# Patient Record
Sex: Female | Born: 1960
Health system: Southern US, Community
[De-identification: ages and names within clinical notes are randomized; demographics above are authoritative.]

## PROBLEM LIST (undated history)

## (undated) DIAGNOSIS — I10 Essential (primary) hypertension: Secondary | ICD-10-CM

## (undated) DIAGNOSIS — K635 Polyp of colon: Secondary | ICD-10-CM

## (undated) DIAGNOSIS — D649 Anemia, unspecified: Secondary | ICD-10-CM

## (undated) DIAGNOSIS — F32A Depression, unspecified: Secondary | ICD-10-CM

## (undated) DIAGNOSIS — F329 Major depressive disorder, single episode, unspecified: Secondary | ICD-10-CM

## (undated) DIAGNOSIS — K5790 Diverticulosis of intestine, part unspecified, without perforation or abscess without bleeding: Secondary | ICD-10-CM

## (undated) DIAGNOSIS — E781 Pure hyperglyceridemia: Secondary | ICD-10-CM

## (undated) DIAGNOSIS — K828 Other specified diseases of gallbladder: Secondary | ICD-10-CM

## (undated) HISTORY — PX: DILATION AND CURETTAGE OF UTERUS: SHX78

## (undated) HISTORY — DX: Depression, unspecified: F32.A

## (undated) HISTORY — PX: BREAST BIOPSY: SHX20

## (undated) HISTORY — DX: Anemia, unspecified: D64.9

## (undated) HISTORY — DX: Other specified diseases of gallbladder: K82.8

## (undated) HISTORY — DX: Polyp of colon: K63.5

## (undated) HISTORY — PX: TUBAL LIGATION: SHX77

## (undated) HISTORY — DX: Major depressive disorder, single episode, unspecified: F32.9

## (undated) HISTORY — DX: Diverticulosis of intestine, part unspecified, without perforation or abscess without bleeding: K57.90

---

## 2010-03-30 HISTORY — PX: BREAST EXCISIONAL BIOPSY: SUR124

## 2013-05-05 LAB — LIPID PANEL
CHOLESTEROL: 280 mg/dL — AB (ref 0–200)
HDL: 52 mg/dL (ref 35–70)
LDL CALC: 168 mg/dL
TRIGLYCERIDES: 301 mg/dL — AB (ref 40–160)

## 2013-05-05 LAB — CMP14+LP+1AC+CBC/D/PLT+TSH
Calcium: 9.8 mg/dL
Chloride: 98 mmol/L
TSH: 0.98

## 2013-05-05 LAB — BASIC METABOLIC PANEL
CREATININE: 0.8 mg/dL (ref 0.5–1.1)
Glucose: 104 mg/dL
POTASSIUM: 3.8 mmol/L (ref 3.4–5.3)
SODIUM: 136 mmol/L — AB (ref 137–147)

## 2013-05-05 LAB — CBC AND DIFFERENTIAL
HEMOGLOBIN: 15.5 g/dL (ref 12.0–16.0)
Platelets: 261 10*3/uL (ref 150–399)
WBC: 8.5 10*3/mL

## 2013-05-05 LAB — HEPATIC FUNCTION PANEL
ALT: 29 U/L (ref 7–35)
AST: 35 U/L (ref 13–35)

## 2014-02-02 ENCOUNTER — Emergency Department
Admission: EM | Admit: 2014-02-02 | Discharge: 2014-02-02 | Disposition: A | Discharge status: Home / Self Care | Payer: 59 | Source: Home / Self Care | Attending: Family Medicine | Admitting: Family Medicine

## 2014-02-02 ENCOUNTER — Encounter: Payer: Self-pay | Admitting: Emergency Medicine

## 2014-02-02 ENCOUNTER — Telehealth: Payer: Self-pay | Admitting: Emergency Medicine

## 2014-02-02 DIAGNOSIS — H9201 Otalgia, right ear: Secondary | ICD-10-CM

## 2014-02-02 HISTORY — DX: Essential (primary) hypertension: I10

## 2014-02-02 LAB — POCT CBC W AUTO DIFF (K'VILLE URGENT CARE)

## 2014-02-02 LAB — SEDIMENTATION RATE: SED RATE: 7 mm/h (ref 0–22)

## 2014-02-02 LAB — C-REACTIVE PROTEIN: CRP: <0.5 mg/dL (ref <0.60)

## 2014-02-02 MED ORDER — ANTIPYRINE-BENZOCAINE 5.4-1.4 % OT SOLN: 3.0000 [drp] | OTIC | Status: DC | PRN

## 2014-02-02 NOTE — ED Provider Notes (Signed)
Nicole Osborne is a 53 y.o. female who presents to Urgent Care today for Right ear pain. Patient has a two-month history of right ear pain worsening over the past 3 days. Pain radiates to the temple. No fevers or chills nausea vomiting or diarrhea. Patient notes tearfulness but denies any change in hearing. She has tried Tylenol and ibuprofen which helped a bit.   Past Medical History  Diagnosis Date  . Hypertension    History reviewed. No pertinent past surgical history. History  Substance Use Topics  . Smoking status: Never Smoker   . Smokeless tobacco: Not on file  . Alcohol Use: Yes   ROS as above Medications: No current facility-administered medications for this encounter.   Current Outpatient Prescriptions  Medication Sig Dispense Refill  . lisinopril (PRINIVIL,ZESTRIL) 20 MG tablet Take 20 mg by mouth daily.    Marland Kitchen antipyrine-benzocaine (AURALGAN) otic solution Place 3-4 drops into the right ear every 2 (two) hours as needed for ear pain. 10 mL 0   No Known Allergies   Exam:  BP 122/80 mmHg  Pulse 72  Temp(Src) 98.9 F (37.2 C) (Oral)  Ht _0  (1.626 m)  Wt 188 lb (85.276 kg)  BMI 32.25 kg/m2  SpO2 97% Gen: Well NAD HEENT: EOMI,  MMM tympanic membranes bilaterally are normal appearing. Mastoids are normal appearing bilaterally. Georgie Chard is nontender. Normal posterior pharynx. Lungs: Normal work of breathing. CTABL Heart: RRR no MRG Abd: NABS, Soft. Nondistended, Nontender Exts: Brisk capillary refill, warm and well perfused.   CBC significant for white blood cell count 8.5, hemoglobin 14.8, platelets 232  Results for orders placed or performed during the hospital encounter of 02/02/14 (from the past 24 hour(s))  CBC w auto diff ([facility])     Status: None   Collection Time: 02/02/14 10:51 AM  Result Value Ref Range   WBC  4.5 - 10.5 K/uL   Lymphocytes relative %  15 - 45 %   Monocytes relative %  2 - 10 %   Neutrophils relative % (GR)  44 - 76 %   Lymphocytes absolute  0.1 - 1.8 K/uL   Monocyes absolute  0.1 - 1 K/uL   Neutrophils absolute (GR#)  1.7 - 7.8 K/uL   RBC  3.8 - 5.1 MIL/uL   Hemoglobin  11.8 - 15.5 g/dL   Hematocrit  34.8 - 46 %   MCV  78 - 100 fL   MCH  26 - 32 pg   MCHC  32 - 36.5 g/dL   RDW  11.6 - 14 %   Platelet count  140 - 400 K/uL   MPV  7.8 - 11 fL   No results found.  Assessment and Plan: 53 y.o. female with right ear pain likely bare otitis media. Treatment with Auralgan. Refer to ear nose and throat. Testing for temporal arteriotis with ESR and CRP pending.  Discussed warning signs or symptoms. Please see discharge instructions. Patient expresses understanding.     Gregor Hams, MD 02/02/14 1059

## 2014-02-02 NOTE — Discharge Instructions (Signed)
Thank you for coming in today. I will call you if your labs come back significantly abnormal.  Follow up with a primary doctor.  Make an appointment with the ENT doctor if not getting better.   Otalgia The most common reason for this in children is an infection of the middle ear. Pain from the middle ear is usually caused by a build-up of fluid and pressure behind the eardrum. Pain from an earache can be sharp, dull, or burning. The pain may be temporary or constant. The middle ear is connected to the nasal passages by a short narrow tube called the Eustachian tube. The Eustachian tube allows fluid to drain out of the middle ear, and helps keep the pressure in your ear equalized. CAUSES  A cold or allergy can block the Eustachian tube with inflammation and the build-up of secretions. This is especially likely in small children, because their Eustachian tube is shorter and more horizontal. When the Eustachian tube closes, the normal flow of fluid from the middle ear is stopped. Fluid can accumulate and cause stuffiness, pain, hearing loss, and an ear infection if germs start growing in this area. SYMPTOMS  The symptoms of an ear infection may include fever, ear pain, fussiness, increased crying, and irritability. Many children will have temporary and minor hearing loss during and right after an ear infection. Permanent hearing loss is rare, but the risk increases the more infections a child has. Other causes of ear pain include retained water in the outer ear canal from swimming and bathing. Ear pain in adults is less likely to be from an ear infection. Ear pain may be referred from other locations. Referred pain may be from the joint between your jaw and the skull. It may also come from a tooth problem or problems in the neck. Other causes of ear pain include:  A foreign body in the ear.  Outer ear infection.  Sinus infections.  Impacted ear wax.  Ear injury.  Arthritis of the jaw or TMJ  problems.  Middle ear infection.  Tooth infections.  Sore throat with pain to the ears. DIAGNOSIS  Your caregiver can usually make the diagnosis by examining you. Sometimes other special studies, including x-rays and lab work may be necessary. TREATMENT   If antibiotics were prescribed, use them as directed and finish them even if you or your child's symptoms seem to be improved.  Sometimes PE tubes are needed in children. These are little plastic tubes which are put into the eardrum during a simple surgical procedure. They allow fluid to drain easier and allow the pressure in the middle ear to equalize. This helps relieve the ear pain caused by pressure changes. HOME CARE INSTRUCTIONS   Only take over-the-counter or prescription medicines for pain, discomfort, or fever as directed by your caregiver. DO NOT GIVE CHILDREN ASPIRIN because of the association of Reye's Syndrome in children taking aspirin.  Use a cold pack applied to the outer ear for 15-20 minutes, 03-04 times per day or as needed may reduce pain. Do not apply ice directly to the skin. You may cause frost bite.  Over-the-counter ear drops used as directed may be effective. Your caregiver may sometimes prescribe ear drops.  Resting in an upright position may help reduce pressure in the middle ear and relieve pain.  Ear pain caused by rapidly descending from high altitudes can be relieved by swallowing or chewing gum. Allowing infants to suck on a bottle during airplane travel can help.  Do  not smoke in the house or near children. If you are unable to quit smoking, smoke outside.  Control allergies. SEEK IMMEDIATE MEDICAL CARE IF:   You or your child are becoming sicker.  Pain or fever relief is not obtained with medicine.  You or your child's symptoms (pain, fever, or irritability) do not improve within 24 to 48 hours or as instructed.  Severe pain suddenly stops hurting. This may indicate a ruptured eardrum.  You  or your children develop new problems such as severe headaches, stiff neck, difficulty swallowing, or swelling of the face or around the ear. Document Released: 11/01/2003 Document Revised: 06/08/2011 Document Reviewed: 03/07/2008 [facility] Patient Information 2015 [place], [place]. This information is not intended to replace advice given to you by your health care provider. Make sure you discuss any questions you have with your health care provider.

## 2014-02-02 NOTE — ED Notes (Signed)
Right earache, treated 2 months ago, worse again 3 days ago

## 2014-03-02 ENCOUNTER — Ambulatory Visit (INDEPENDENT_AMBULATORY_CARE_PROVIDER_SITE_OTHER): Payer: 59 | Admitting: Family Medicine

## 2014-03-02 ENCOUNTER — Encounter: Payer: Self-pay | Admitting: Family Medicine

## 2014-03-02 VITALS — BP 122/81 | HR 88 | Ht 64.0 in | Wt 187.5 lb

## 2014-03-02 DIAGNOSIS — E785 Hyperlipidemia, unspecified: Secondary | ICD-10-CM

## 2014-03-02 DIAGNOSIS — R1013 Epigastric pain: Secondary | ICD-10-CM

## 2014-03-02 DIAGNOSIS — F32A Depression, unspecified: Secondary | ICD-10-CM

## 2014-03-02 DIAGNOSIS — F324 Major depressive disorder, single episode, in partial remission: Secondary | ICD-10-CM | POA: Insufficient documentation

## 2014-03-02 DIAGNOSIS — F329 Major depressive disorder, single episode, unspecified: Secondary | ICD-10-CM

## 2014-03-02 DIAGNOSIS — I10 Essential (primary) hypertension: Secondary | ICD-10-CM | POA: Insufficient documentation

## 2014-03-02 MED ORDER — VILAZODONE HCL 40 MG PO TABS: 40.0000 mg | ORAL_TABLET | Freq: Every day | ORAL | Status: DC

## 2014-03-02 MED ORDER — LISINOPRIL 20 MG PO TABS: 20.0000 mg | ORAL_TABLET | Freq: Every day | ORAL | Status: DC

## 2014-03-02 MED ORDER — VILAZODONE HCL 10 MG PO TABS: ORAL_TABLET | ORAL | Status: DC

## 2014-03-02 MED ORDER — LISINOPRIL-HYDROCHLOROTHIAZIDE 20-25 MG PO TABS: 1.0000 | ORAL_TABLET | Freq: Every day | ORAL | Status: DC

## 2014-03-02 NOTE — Progress Notes (Signed)
CC: Nicole Osborne is a 53 y.o. 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 She's been taking the lisinopril-hydrochlorothiazide for years now with no outside blood pressures to report. Denies any known side effects from this medication.  Reports history of depression and has been treated with Effexor however this caused blood pressure to go up, she was later tried on Lexapro however this caused severe fatigue and the need to sleep most hours of the day. She states her current symptoms are crying episodes for nothing in particular, irritability, and easily frustrated by others. Symptoms seem to be worse this time of the year. Symptoms are also worsened by now being on the night shift which is new for her. She denies thoughts of wanting to harm herself or others. She was once on Viibrid however ran out of this a month and a half ago because insurance would no longer cover it.  Reports a history of hyperlipidemia currently not on any cholesterol-lowering medication. She is unsure of the specifics of her total cholesterol and LDL, she believes it was checked about a year ago. She denies any known history of cardiovascular disease. She is not a smoker  Complains of epigastric pain that comes on unpredictably a few times every month. It is worsened with drinking soda. Nothing else seems to make it better or worse. It is nonradiating. She's never had this evaluated.  Review of Systems - General ROS: negative for - chills, fever, night sweats, weight gain or weight loss Ophthalmic ROS: negative for - decreased vision Psychological ROS: negative for - anxiety or depression ENT ROS: negative for - hearing change, nasal congestion, tinnitus or allergies Hematological and Lymphatic ROS: negative for - bleeding problems, bruising  or swollen lymph nodes Breast ROS: negative Respiratory ROS: no cough, shortness of breath, or wheezing Cardiovascular ROS: no chest pain or dyspnea on exertion Gastrointestinal ROS: no  change in bowel habits, or black or bloody stools Genito-Urinary ROS: negative for - genital discharge, genital ulcers, incontinence or abnormal bleeding from genitals Musculoskeletal ROS: negative for - joint pain or muscle pain Neurological ROS: negative for - headaches or memory loss Dermatological ROS: negative for lumps, mole changes, rash and skin lesion changes  Past Medical History  Diagnosis Date  . Hypertension     No past surgical history on file. No family history on file.  History   Social History  . Marital Status: Married    Spouse Name: N/A    Number of Children: N/A  . Years of Education: N/A   Occupational History  . Not on file.   Social History Main Topics  . Smoking status: Never Smoker   . Smokeless tobacco: Not on file  . Alcohol Use: Yes  . Drug Use: Not on file  . Sexual Activity: Not on file   Other Topics Concern  . Not on file   Social History Narrative     Objective: BP 122/81 mmHg  Pulse 88  Ht 5\' 4"  (1.626 m)  Wt 187 lb 8 oz (85.049 kg)  BMI 32.17 kg/m2  SpO2 96%  General: Alert and Oriented, No Acute Distress HEENT: Pupils equal, round, reactive to light. Conjunctivae clear.  Moist mucous membranes pharynx unremarkable Lungs: Clear to auscultation bilaterally, no wheezing/ronchi/rales.  Comfortable work of breathing. Good air movement. Cardiac: Regular rate  and rhythm. Normal S1/S2.  No murmurs, rubs, nor gallops.   Abdomen: Mild obesity and soft Extremities: No peripheral edema.  Strong peripheral pulses.  Mental Status: Mild depression. No anxiety, nor agitation. Skin: Warm and dry. Occasional tearfulness  Assessment & Plan: Arlo was seen today for establish care.  Diagnoses and associated orders for this visit:  Essential  hypertension - COMPLETE METABOLIC PANEL WITH GFR - Discontinue: lisinopril (PRINIVIL,ZESTRIL) 20 MG tablet; Take 1 tablet (20 mg total) by mouth daily.  Epigastric abdominal pain - COMPLETE METABOLIC PANEL WITH GFR  Hyperlipidemia  Depression - Vilazodone HCl (VIIBRYD) 10 MG TABS; One by mouth daily for 7 days then two by mouth daily for 7 days then begin 40mg  formulation. - Vilazodone HCl (VIIBRYD) 40 MG TABS; Take 1 tablet (40 mg total) by mouth daily. Begin after starting regimen.  Other Orders - lisinopril-hydrochlorothiazide (PRINZIDE,ZESTORETIC) 20-25 MG per tablet; Take 1 tablet by mouth daily.    Essential hypertension: Controlled continue lisinopril, she initially informed me that she was only taking lisinopril but it was later found that lisinopril hydrochlorothiazide was the actual formulation of her antihypertensive. Checking renal function Epigastric pain: Provided with a complete metabolic panel ordered to be obtained on the day that she has any return of her abdominal discomfort Depression: Uncontrolled, resubmitted a prescription for Viibryd.  I suspect that she had a prior authorization for her most recent refill that was not approved due to her being on a new insurance plan that likely did not know she had failed 2 other common anti-depression medications.  Return in about 3 months (around 06/01/2014) for After begining mood medication.Marland Kitchen Marland Kitchen

## 2014-03-28 ENCOUNTER — Encounter: Payer: Self-pay | Admitting: Family Medicine

## 2014-03-28 ENCOUNTER — Other Ambulatory Visit: Payer: Self-pay | Admitting: Family Medicine

## 2014-03-28 DIAGNOSIS — Z9889 Other specified postprocedural states: Secondary | ICD-10-CM | POA: Insufficient documentation

## 2014-04-24 LAB — COMPLETE METABOLIC PANEL WITH GFR
ALT: 47 U/L — AB (ref 0–35)
AST: 52 U/L — ABNORMAL HIGH (ref 0–37)
Albumin: 4.2 g/dL (ref 3.5–5.2)
Alkaline Phosphatase: 69 U/L (ref 39–117)
BILIRUBIN TOTAL: 1.1 mg/dL (ref 0.2–1.2)
BUN: 11 mg/dL (ref 6–23)
CALCIUM: 9.8 mg/dL (ref 8.4–10.5)
CO2: 30 mEq/L (ref 19–32)
Chloride: 99 mEq/L (ref 96–112)
Creat: 0.69 mg/dL (ref 0.50–1.10)
GFR, Est Non African American: >89 mL/min
Glucose, Bld: 77 mg/dL (ref 70–99)
POTASSIUM: 4 meq/L (ref 3.5–5.3)
SODIUM: 138 meq/L (ref 135–145)
Total Protein: 6.6 g/dL (ref 6.0–8.3)

## 2014-04-25 ENCOUNTER — Telehealth: Payer: Self-pay | Admitting: Family Medicine

## 2014-04-25 ENCOUNTER — Encounter: Payer: Self-pay | Admitting: *Deleted

## 2014-04-25 DIAGNOSIS — R7989 Other specified abnormal findings of blood chemistry: Secondary | ICD-10-CM | POA: Insufficient documentation

## 2014-04-25 DIAGNOSIS — R945 Abnormal results of liver function studies: Principal | ICD-10-CM

## 2014-04-25 NOTE — Telephone Encounter (Signed)
Seth Bake, Will you please let patient know that her liver enzymes were mildly elevated which reflects liver or gallbladder inflammation that could be causing her epigastric pain.  I'd recommend she have an ultrasound of the abdomen performed, an order for this has been placed.  Avoid taking acetaminophen or consuming any alcohol since this could also cause this type of lab result.

## 2014-04-25 NOTE — Telephone Encounter (Signed)
#   has been changed or d/c; I sent results and advice recorded below in a mychart message. I also sent her message to update her contact info

## 2014-04-27 ENCOUNTER — Telehealth: Payer: Self-pay | Admitting: *Deleted

## 2014-04-27 NOTE — Telephone Encounter (Signed)
Nicole Osborne in imaging called and wanted to let us know that she tried to schedule this pt for U/S but the pt was unaware of why she needed an U/S. Apparently the # that was orig in the computer was incorrect and that's why I was unable to reach pt on phone and patient had not checked mychart message yet. Called and left a message on pt's vm with results in elevated liver enzymes

## 2014-05-22 ENCOUNTER — Ambulatory Visit (INDEPENDENT_AMBULATORY_CARE_PROVIDER_SITE_OTHER): Payer: 59

## 2014-05-22 DIAGNOSIS — R945 Abnormal results of liver function studies: Principal | ICD-10-CM

## 2014-05-22 DIAGNOSIS — R1013 Epigastric pain: Secondary | ICD-10-CM

## 2014-05-22 DIAGNOSIS — R7989 Other specified abnormal findings of blood chemistry: Secondary | ICD-10-CM

## 2014-06-01 ENCOUNTER — Ambulatory Visit (INDEPENDENT_AMBULATORY_CARE_PROVIDER_SITE_OTHER): Payer: 59 | Admitting: Family Medicine

## 2014-06-01 ENCOUNTER — Encounter: Payer: Self-pay | Admitting: Family Medicine

## 2014-06-01 VITALS — BP 112/64 | HR 64 | Wt 177.0 lb

## 2014-06-01 DIAGNOSIS — Z86010 Personal history of colonic polyps: Secondary | ICD-10-CM

## 2014-06-01 DIAGNOSIS — F329 Major depressive disorder, single episode, unspecified: Secondary | ICD-10-CM

## 2014-06-01 DIAGNOSIS — R7989 Other specified abnormal findings of blood chemistry: Secondary | ICD-10-CM

## 2014-06-01 DIAGNOSIS — I10 Essential (primary) hypertension: Secondary | ICD-10-CM

## 2014-06-01 DIAGNOSIS — F32A Depression, unspecified: Secondary | ICD-10-CM

## 2014-06-01 DIAGNOSIS — E785 Hyperlipidemia, unspecified: Secondary | ICD-10-CM

## 2014-06-01 DIAGNOSIS — R945 Abnormal results of liver function studies: Secondary | ICD-10-CM

## 2014-06-01 NOTE — Progress Notes (Signed)
CC: Nicole Osborne is a 54 y.o. female is here for Hypertension   Subjective: HPI:  Follow-up central hypertension: Continues to take lisinopril-hydrochlorothiazide on a daily basis. No outside blood pressures to report. No chest pain shortness of breath orthopnea nor peripheral edema  Follow-up depression: Since I saw her last she began taking Viibryd.  She has not noticed any side effects. She knows that she is more of a happy person now and denies any subjective depression. No longer having crying episodes are getting frustrated others over simple things. She is very happy with her current state of mental health. No thoughts of wanting to harm herself or others  I saw her last she had a mildly elevated pair of liver enzymes. She is a social drinker and a recent ultrasound showed hepatic steatosis. She denies any right upper quadrant pain recently or remotely. She's been trying to cut back on her drinking and she is actively losing weight with exercise and Weight [employer]chers.  Follow-up hyperlipidemia: She tells me it's been about a year since her cholesterol was checked last. From what she can remember when it was checked over a year ago was mildly elevated but she is not sure which exact cholesterol parameter this is in reference to  She had a colonoscopy 3 years ago a polyp was removed and she was told that she would need to have a colonoscopy every 3 years due to a family history of colon cancer. She has not set up a colonoscopy with any local clinic since moving here.   Review Of Systems Outlined In HPI  Past Medical History  Diagnosis Date  . Hypertension     No past surgical history on file. No family history on file.  History   Social History  . Marital Status: Married    Spouse Name: N/A  . Number of Children: N/A  . Years of Education: N/A   Occupational History  . Not on file.   Social History Main Topics  . Smoking status: Never Smoker   . Smokeless tobacco: Not  on file  . Alcohol Use: Yes  . Drug Use: Not on file  . Sexual Activity: Not on file   Other Topics Concern  . Not on file   Social History Narrative     Objective: BP 112/64 mmHg  Pulse 64  Wt 177 lb (80.287 kg)  General: Alert and Oriented, No Acute Distress HEENT: Pupils equal, round, reactive to light. Conjunctivae clear.  Moist mucous membranes Lungs: Clear to auscultation bilaterally, no wheezing/ronchi/rales.  Comfortable work of breathing. Good air movement. Cardiac: Regular rate and rhythm. Normal S1/S2.  No murmurs, rubs, nor gallops.   Abdomen: Mildly overweight, soft Extremities: No peripheral edema.  Strong peripheral pulses.  Mental Status: No depression, anxiety, nor agitation. Skin: Warm and dry.  Assessment & Plan: Nicole Osborne was seen today for hypertension.  Diagnoses and all orders for this visit:  Essential hypertension  Depression  Elevated LFTs Orders: -     COMPLETE METABOLIC PANEL WITH GFR  Hyperlipidemia Orders: -     Lipid panel  History of colonic polyps Orders: -     Ambulatory referral to Gastroenterology   essential hypertension: Controlled continue lisinopril-hydrochlorothiazide, Depression: Controlled continue Viibryd Elevated LFTs: Rechecking liver function today due to successful weight loss, currently this elevation is attributed to hepatic steatosis and alcohol use Hyperlipidemia: Checking lipid panel Referral to gastroenterology for consideration of colonoscopy Return in about 6 months (around 12/02/2014) for mood.

## 2014-06-12 ENCOUNTER — Encounter: Payer: Self-pay | Admitting: Family Medicine

## 2014-07-05 ENCOUNTER — Encounter: Payer: Self-pay | Admitting: Internal Medicine

## 2014-07-21 LAB — COMPLETE METABOLIC PANEL WITH GFR
ALT: 28 U/L (ref 0–35)
AST: 36 U/L (ref 0–37)
Albumin: 4 g/dL (ref 3.5–5.2)
Alkaline Phosphatase: 68 U/L (ref 39–117)
BUN: 13 mg/dL (ref 6–23)
CO2: 27 mEq/L (ref 19–32)
Calcium: 9.8 mg/dL (ref 8.4–10.5)
Chloride: 104 mEq/L (ref 96–112)
Creat: 0.73 mg/dL (ref 0.50–1.10)
GFR, Est Non African American: >89 mL/min
GLUCOSE: 100 mg/dL — AB (ref 70–99)
POTASSIUM: 4.2 meq/L (ref 3.5–5.3)
SODIUM: 140 meq/L (ref 135–145)
TOTAL PROTEIN: 6.8 g/dL (ref 6.0–8.3)
Total Bilirubin: 0.9 mg/dL (ref 0.2–1.2)

## 2014-07-21 LAB — LIPID PANEL
CHOLESTEROL: 253 mg/dL — AB (ref 0–200)
HDL: 61 mg/dL (ref >=46)
LDL CALC: 160 mg/dL — AB (ref 0–99)
Total CHOL/HDL Ratio: 4.1 Ratio
Triglycerides: 159 mg/dL — ABNORMAL HIGH (ref <150)
VLDL: 32 mg/dL (ref 0–40)

## 2014-07-23 ENCOUNTER — Telehealth: Payer: Self-pay | Admitting: Family Medicine

## 2014-07-23 DIAGNOSIS — R945 Abnormal results of liver function studies: Secondary | ICD-10-CM

## 2014-07-23 DIAGNOSIS — R739 Hyperglycemia, unspecified: Secondary | ICD-10-CM

## 2014-07-23 DIAGNOSIS — R7989 Other specified abnormal findings of blood chemistry: Secondary | ICD-10-CM

## 2014-07-23 DIAGNOSIS — E785 Hyperlipidemia, unspecified: Secondary | ICD-10-CM

## 2014-07-23 NOTE — Telephone Encounter (Signed)
Andre,a Will you please let patient know that her kidney function and liver function were normal.  The elevated liver enzymes seen three months ago has resolved.  Blood sugar was one point above what's considered normal but not in the diabetic range. This can be improved with engaging in 30-45 minutes of moderate exercise most days of the week. Cholesterol was also moderately elevated but not do a degree that needs cholesterol lowering medication at her current age.  Again this can improve with exercise.  I'd recommend she follow up in September or sooner as needed.

## 2014-07-23 NOTE — Telephone Encounter (Signed)
Left message on vm

## 2015-02-28 ENCOUNTER — Ambulatory Visit: Payer: 59 | Admitting: Family Medicine

## 2015-03-05 ENCOUNTER — Ambulatory Visit (INDEPENDENT_AMBULATORY_CARE_PROVIDER_SITE_OTHER): Payer: 59 | Admitting: Family Medicine

## 2015-03-05 ENCOUNTER — Encounter: Payer: Self-pay | Admitting: Family Medicine

## 2015-03-05 VITALS — BP 121/80 | HR 82 | Wt 191.0 lb

## 2015-03-05 DIAGNOSIS — R1011 Right upper quadrant pain: Secondary | ICD-10-CM | POA: Diagnosis not present

## 2015-03-05 DIAGNOSIS — F32A Depression, unspecified: Secondary | ICD-10-CM

## 2015-03-05 DIAGNOSIS — R0981 Nasal congestion: Secondary | ICD-10-CM | POA: Diagnosis not present

## 2015-03-05 DIAGNOSIS — I10 Essential (primary) hypertension: Secondary | ICD-10-CM | POA: Diagnosis not present

## 2015-03-05 DIAGNOSIS — F329 Major depressive disorder, single episode, unspecified: Secondary | ICD-10-CM

## 2015-03-05 LAB — GAMMA GT: GGT: 28 U/L (ref 7–51)

## 2015-03-05 LAB — HEPATIC FUNCTION PANEL
ALBUMIN: 4.3 g/dL (ref 3.6–5.1)
ALK PHOS: 80 U/L (ref 33–130)
ALT: 49 U/L — ABNORMAL HIGH (ref 6–29)
AST: 49 U/L — ABNORMAL HIGH (ref 10–35)
Bilirubin, Direct: 0.1 mg/dL (ref <=0.2)
Indirect Bilirubin: 1 mg/dL (ref 0.2–1.2)
TOTAL PROTEIN: 6.7 g/dL (ref 6.1–8.1)
Total Bilirubin: 1.1 mg/dL (ref 0.2–1.2)

## 2015-03-05 MED ORDER — VILAZODONE HCL 40 MG PO TABS: 40.0000 mg | ORAL_TABLET | Freq: Every day | ORAL | Status: DC

## 2015-03-05 MED ORDER — LISINOPRIL-HYDROCHLOROTHIAZIDE 20-25 MG PO TABS: 0.5000 | ORAL_TABLET | Freq: Every day | ORAL | Status: DC

## 2015-03-05 NOTE — Progress Notes (Signed)
CC: Nicole Osborne is a 54 y.o. female is here for Depression and Right Side Pain   Subjective: HPI:  Follow-up essential hypertension: She had a sensation that her blood pressure was too low when taking a full tablet of lisinopril-hydrochlorothiazide and begin taking half a tablet daily. She's been checking her blood pressure at work and its always in the normotensive range. She denies any known side effects currently. She denies chest pain shortness of breath orthopnea nor peripheral edema  Follow-up depression: She still very happy with her current Viibryd dose, 40 mg every day. She denies loss of interest in hobbies or irritability towards others. She is overall happy with life.  Complains of some nasal congestion that's been present for the past 2 or 3 days. Denies any sore throat, fevers or cough. No interventions as of yet symptoms are mild in severity  Her major complaint today is right upper quadrant pain has been present on a daily basis for at least a month now. It's worse with ingestion of creamer nothing else makes it worse with respect to diet. She denies any fevers or jaundice. Pain is worse the longer she is working and is nonradiating. She denies nausea, diarrhea or constipation.   Review Of Systems Outlined In HPI  Past Medical History  Diagnosis Date  . Hypertension     No past surgical history on file. No family history on file.  Social History   Social History  . Marital Status: Married    Spouse Name: N/A  . Number of Children: N/A  . Years of Education: N/A   Occupational History  . Not on file.   Social History Main Topics  . Smoking status: Never Smoker   . Smokeless tobacco: Not on file  . Alcohol Use: Yes  . Drug Use: Not on file  . Sexual Activity: Not on file   Other Topics Concern  . Not on file   Social History Narrative     Objective: BP 121/80 mmHg  Pulse 82  Wt 191 lb (86.637 kg)  Vital signs reviewed. General: Alert and  Oriented, No Acute Distress HEENT: Pupils equal, round, reactive to light. Conjunctivae clear.  External ears unremarkable.  Moist mucous membranes. Lungs: Clear and comfortable work of breathing, speaking in full sentences without accessory muscle use. Cardiac: Regular rate and rhythm.  Abdomen: Obese, soft, no rebound tenderness, unable to reproduce pain with palpating right upper quadrant Extremities: No peripheral edema.  Strong peripheral pulses.  Mental Status: No depression, anxiety, nor agitation. Logical though process. Skin: Warm and dry. Assessment & Plan: Nicole Osborne was seen today for depression and right side pain.  Diagnoses and all orders for this visit:  Essential hypertension -     lisinopril-hydrochlorothiazide (PRINZIDE,ZESTORETIC) 20-25 MG tablet; Take 0.5 tablets by mouth daily.  Depression -     Vilazodone HCl (VIIBRYD) 40 MG TABS; Take 1 tablet (40 mg total) by mouth daily.  RUQ pain -     Hepatic function panel -     Gamma GT  Nasal congestion   Essential hypertension: Controlled continue lisinopril-hydrochlorothiazide Depression: Controlled continue Viibryd Nasal congestion: Sounds like common cold, consider over-the-counter Mucinex if no better by Monday call for any antibiotic Right upper quadrant pain: Screening for return of her mild hepatitis from when I first met her over a year ago and will also screen for gallbladder inflammation.  Return in about 6 months (around 09/03/2015).

## 2015-03-06 ENCOUNTER — Telehealth: Payer: Self-pay | Admitting: Family Medicine

## 2015-03-06 DIAGNOSIS — R945 Abnormal results of liver function studies: Principal | ICD-10-CM

## 2015-03-06 DIAGNOSIS — R7989 Other specified abnormal findings of blood chemistry: Secondary | ICD-10-CM

## 2015-03-06 MED ORDER — VITAMIN E 180 MG (400 UNIT) PO CAPS: 400.0000 [IU] | ORAL_CAPSULE | Freq: Two times a day (BID) | ORAL | Status: DC

## 2015-03-06 NOTE — Telephone Encounter (Signed)
Will you please let patient know that her gallbladder did not show any signs of inflammation however her liver enzymes are mildly elevated reflecting mild liver inflammation.  I'd recommend starting an OTC Vitamin E supplement at a dose of 400 Units twice a day.  This can also be improved with mild weight loss.  I'd recommend f/u in 3 months to recheck this.

## 2015-03-06 NOTE — Telephone Encounter (Signed)
Awaiting call back.

## 2015-03-06 NOTE — Telephone Encounter (Signed)
Pt.notified

## 2015-04-03 MED FILL — VIIBRYD 40 MG TABLET: 40 | 90 days supply | Qty: 90 | Fill #0

## 2015-07-08 ENCOUNTER — Other Ambulatory Visit: Payer: Self-pay | Admitting: Family Medicine

## 2015-07-08 MED FILL — VIIBRYD 40 MG TABLET: 40 | 90 days supply | Qty: 90 | Fill #1

## 2015-07-08 MED FILL — LISINOPRIL-HCTZ 20-25 MG TA: 20-25 | 90 days supply | Qty: 45 | Fill #0

## 2015-07-09 ENCOUNTER — Encounter: Payer: Self-pay | Admitting: Family Medicine

## 2015-07-09 ENCOUNTER — Ambulatory Visit (INDEPENDENT_AMBULATORY_CARE_PROVIDER_SITE_OTHER): Payer: 59 | Admitting: Family Medicine

## 2015-07-09 VITALS — BP 144/86 | HR 80 | Wt 188.0 lb

## 2015-07-09 DIAGNOSIS — R1011 Right upper quadrant pain: Secondary | ICD-10-CM | POA: Diagnosis not present

## 2015-07-09 DIAGNOSIS — I10 Essential (primary) hypertension: Secondary | ICD-10-CM

## 2015-07-09 MED ORDER — LISINOPRIL-HYDROCHLOROTHIAZIDE 20-25 MG PO TABS: 0.5000 | ORAL_TABLET | Freq: Every day | ORAL | Status: DC

## 2015-07-09 MED ORDER — METHOCARBAMOL 500 MG PO TABS: 500.0000 mg | ORAL_TABLET | Freq: Three times a day (TID) | ORAL | Status: DC | PRN

## 2015-07-09 MED FILL — METHOCARBAMOL 500 MG TABLET: 500 | 20 days supply | Qty: 60 | Fill #0

## 2015-07-09 NOTE — Progress Notes (Signed)
CC: Nicole Osborne is a 55 y.o. female is here for right side pain   Subjective: HPI:  Continued right upper quadrant pain occurring most days of the week. Interestingly it's only present when she is working. Her job involves sitting for long periods of time. It's localized right upper quadrant and occasionally radiates into the back. It is described as dull. No dietary links, for example she can eat peanut butter and jelly or they can without triggering any symptoms. She denies any nausea or decreased appetite when pain is present. No interventions as of yet other than lab work and a ultrasound suggesting a fatty liver. She denies constipation, diarrhea or any genitourinary complaints. Nothing seems to make the pain better or worse it goes away on its own it never wakes her up from sleeping.  She is requesting refills on lisinopril-hydrochlorothiazide. She's been out of it for 2 days. She denies any chest pain shortness of breath or edema.   Review Of Systems Outlined In HPI  Past Medical History  Diagnosis Date  . Hypertension     No past surgical history on file. No family history on file.  Social History   Social History  . Marital Status: Married    Spouse Name: N/A  . Number of Children: N/A  . Years of Education: N/A   Occupational History  . Not on file.   Social History Main Topics  . Smoking status: Never Smoker   . Smokeless tobacco: Not on file  . Alcohol Use: Yes  . Drug Use: Not on file  . Sexual Activity: Not on file   Other Topics Concern  . Not on file   Social History Narrative     Objective: BP 144/86 mmHg  Pulse 80  Wt 188 lb (85.276 kg)  General: Alert and Oriented, No Acute Distress HEENT: Pupils equal, round, reactive to light. Conjunctivae clear.  Moist mucous membranes Lungs: Clear to auscultation bilaterally, no wheezing/ronchi/rales.  Comfortable work of breathing. Good air movement. Cardiac: Regular rate and rhythm. Normal S1/S2.  No  murmurs, rubs, nor gallops.   Abdomen: Normal bowel sounds, soft and non tender without palpable masses. Extremities: No peripheral edema.  Strong peripheral pulses.  Mental Status: No depression, anxiety, nor agitation. Skin: Warm and dry.  Assessment & Plan: [name] was seen today for right side pain.  Diagnoses and all orders for this visit:  Essential hypertension -     lisinopril-hydrochlorothiazide (PRINZIDE,ZESTORETIC) 20-25 MG tablet; Take 0.5 tablets by mouth daily.  RUQ pain  Other orders -     methocarbamol (ROBAXIN) 500 MG tablet; Take 1 tablet (500 mg total) by mouth every 8 (eight) hours as needed (ruq pain).    Essential hypertension: Uncontrolled off of lisinopril-hydrochlorothiazide restore former regimen of half a pill daily Right upper quadrant pain: Possible her pain is just due to a muscular strain/spasm therefore will use Robaxin as a diagnostic and hopefully therapeutic intervention. Return if symptoms worsen or fail to improve.

## 2015-10-22 MED FILL — LISINOPRIL-HCTZ 20-25 MG TA: 20-25 | 90 days supply | Qty: 45 | Fill #1

## 2015-10-22 MED FILL — VIIBRYD 40 MG TABLET: 40 | 90 days supply | Qty: 90 | Fill #2

## 2016-01-22 ENCOUNTER — Observation Stay (HOSPITAL_COMMUNITY)
Admission: EM | Admit: 2016-01-22 | Discharge: 2016-01-23 | Disposition: A | Discharge status: Home / Self Care | Payer: 59 | Attending: Internal Medicine | Admitting: Internal Medicine

## 2016-01-22 ENCOUNTER — Encounter (HOSPITAL_COMMUNITY): Payer: Self-pay

## 2016-01-22 ENCOUNTER — Encounter (HOSPITAL_COMMUNITY): Payer: Self-pay | Admitting: Emergency Medicine

## 2016-01-22 ENCOUNTER — Emergency Department (HOSPITAL_COMMUNITY): Payer: 59

## 2016-01-22 ENCOUNTER — Ambulatory Visit (HOSPITAL_COMMUNITY)
Admission: EM | Admit: 2016-01-22 | Discharge: 2016-01-22 | Disposition: A | Discharge status: Nursing Home (Includes ICF) | Payer: 59 | Source: Home / Self Care | Attending: Emergency Medicine | Admitting: Emergency Medicine

## 2016-01-22 DIAGNOSIS — R0789 Other chest pain: Secondary | ICD-10-CM | POA: Diagnosis not present

## 2016-01-22 DIAGNOSIS — E781 Pure hyperglyceridemia: Secondary | ICD-10-CM | POA: Insufficient documentation

## 2016-01-22 DIAGNOSIS — R079 Chest pain, unspecified: Secondary | ICD-10-CM

## 2016-01-22 DIAGNOSIS — Z7982 Long term (current) use of aspirin: Secondary | ICD-10-CM | POA: Diagnosis not present

## 2016-01-22 DIAGNOSIS — E7800 Pure hypercholesterolemia, unspecified: Secondary | ICD-10-CM | POA: Diagnosis not present

## 2016-01-22 DIAGNOSIS — E860 Dehydration: Secondary | ICD-10-CM | POA: Diagnosis not present

## 2016-01-22 DIAGNOSIS — I10 Essential (primary) hypertension: Secondary | ICD-10-CM | POA: Diagnosis present

## 2016-01-22 DIAGNOSIS — R42 Dizziness and giddiness: Secondary | ICD-10-CM

## 2016-01-22 DIAGNOSIS — F329 Major depressive disorder, single episode, unspecified: Secondary | ICD-10-CM | POA: Insufficient documentation

## 2016-01-22 DIAGNOSIS — E785 Hyperlipidemia, unspecified: Secondary | ICD-10-CM | POA: Diagnosis not present

## 2016-01-22 HISTORY — DX: Pure hyperglyceridemia: E78.1

## 2016-01-22 LAB — BASIC METABOLIC PANEL
Anion gap: 10 (ref 5–15)
BUN: 10 mg/dL (ref 6–20)
CALCIUM: 10.2 mg/dL (ref 8.9–10.3)
CO2: 26 mmol/L (ref 22–32)
Chloride: 100 mmol/L — ABNORMAL LOW (ref 101–111)
Creatinine, Ser: 0.82 mg/dL (ref 0.44–1.00)
GFR calc Af Amer: >60 mL/min (ref >60)
GLUCOSE: 126 mg/dL — AB (ref 65–99)
Potassium: 3.6 mmol/L (ref 3.5–5.1)
Sodium: 136 mmol/L (ref 135–145)

## 2016-01-22 LAB — D-DIMER, QUANTITATIVE (NOT AT ARMC): D DIMER QUANT: 0.29 ug{FEU}/mL (ref 0.00–0.50)

## 2016-01-22 LAB — I-STAT TROPONIN, ED
TROPONIN I, POC: 0 ng/mL (ref 0.00–0.08)
Troponin i, poc: 0.08 ng/mL (ref 0.00–0.08)

## 2016-01-22 LAB — HEPATIC FUNCTION PANEL
ALT: 47 U/L (ref 14–54)
AST: 53 U/L — ABNORMAL HIGH (ref 15–41)
Albumin: 4.5 g/dL (ref 3.5–5.0)
Alkaline Phosphatase: 74 U/L (ref 38–126)
BILIRUBIN DIRECT: 0.1 mg/dL (ref 0.1–0.5)
BILIRUBIN INDIRECT: 1.1 mg/dL — AB (ref 0.3–0.9)
BILIRUBIN TOTAL: 1.2 mg/dL (ref 0.3–1.2)
Total Protein: 7.6 g/dL (ref 6.5–8.1)

## 2016-01-22 LAB — CBC
HEMATOCRIT: 44.9 % (ref 36.0–46.0)
HEMOGLOBIN: 15.5 g/dL — AB (ref 12.0–15.0)
MCH: 29.2 pg (ref 26.0–34.0)
MCHC: 34.5 g/dL (ref 30.0–36.0)
MCV: 84.6 fL (ref 78.0–100.0)
Platelets: 254 10*3/uL (ref 150–400)
RBC: 5.31 MIL/uL — ABNORMAL HIGH (ref 3.87–5.11)
RDW: 12 % (ref 11.5–15.5)
WBC: 11.4 10*3/uL — ABNORMAL HIGH (ref 4.0–10.5)

## 2016-01-22 LAB — TROPONIN I: Troponin I: <0.03 ng/mL (ref <0.03)

## 2016-01-22 MED ORDER — ENOXAPARIN SODIUM 40 MG/0.4ML ~~LOC~~ SOLN
40.0000 mg | SUBCUTANEOUS | Status: DC
  Administered 2016-01-23: 40 mg via SUBCUTANEOUS
  Filled 2016-01-22: qty 0.4

## 2016-01-22 MED ORDER — MORPHINE SULFATE (PF) 2 MG/ML IV SOLN: 2.0000 mg | INTRAVENOUS | Status: DC | PRN

## 2016-01-22 MED ORDER — ACETAMINOPHEN 325 MG PO TABS: 650.0000 mg | ORAL_TABLET | ORAL | Status: DC | PRN

## 2016-01-22 MED ORDER — VILAZODONE HCL 40 MG PO TABS
40.0000 mg | ORAL_TABLET | Freq: Every day | ORAL | Status: DC
  Administered 2016-01-23: 40 mg via ORAL
  Filled 2016-01-22: qty 1

## 2016-01-22 MED ORDER — ASPIRIN 81 MG PO CHEW
CHEWABLE_TABLET | ORAL | Status: AC
  Filled 2016-01-22: qty 4

## 2016-01-22 MED ORDER — LISINOPRIL 10 MG PO TABS
10.0000 mg | ORAL_TABLET | Freq: Every day | ORAL | Status: DC
  Administered 2016-01-23: 10 mg via ORAL
  Filled 2016-01-22: qty 1

## 2016-01-22 MED ORDER — ONDANSETRON HCL 4 MG/2ML IJ SOLN: 4.0000 mg | Freq: Four times a day (QID) | INTRAMUSCULAR | Status: DC | PRN

## 2016-01-22 MED ORDER — ASPIRIN 81 MG PO CHEW
324.0000 mg | CHEWABLE_TABLET | Freq: Once | ORAL | Status: AC
  Administered 2016-01-22: 324 mg via ORAL

## 2016-01-22 NOTE — ED Triage Notes (Signed)
The patient presented to the [facility] with a complaint of chest heaviness and dizziness that occurred around 12 pm today. The patient stated that this has happened 1 week ago. The patient described the pain as a heaviness with no radiation that is not reproducible with inhalation or palpation.

## 2016-01-22 NOTE — H&P (Signed)
Nicole Osborne [id number] DOB: 29-Aug-1960 DOA: 01/22/2016     PCP: Marcial Pacas, DO   Outpatient Specialists: none   Patient coming from:   home Lives   With family husband    Chief Complaint: Chest heaviness and dizziness  HPI: Nicole Osborne is a 55 y.o. female with medical history significant of hypertension hyperlipidemia depression    Presented with a she had an episode of chest heaviness at 12 PM today for past 1 week she's been having intermittent sensation of lightheadedness and fatigue she attributed to her URI. Describes this chest heaviness radiating not reproducible by palpation and not worse with inspiration she now has some residual left shoulder pain no family history of early coronary artery disease patient receptors includes hypertension hyperlipidemia. Reports good PO intake. No travel or leg swelling. Reports her respiratory symptoms have resolved. Denies feling light headed when she stands up  Patient describes the symptoms as feeling and she is about to faint last week that lasted about 10-15 minutes. Today chest heaviness lasted 1 hour. Patient attempted to eat some food and his symptoms have resolved  no aggravating symptoms. Patient endorses chronic shrtness of breath with exertion such as walking up stairs no chest pain. Patient to some ibuprofen last week and that also symptoms help. Patient endorses some nausea but no diaphoresis no vomiting no palpitation associated with abnormal abdominal pain last week she was evaluated for right upper quadrant abdominal pain which happens usually when she sits. Chest pain is nonexertional. Her sis ter has been recently been diagnosed with breast Ca.    Patient was at first evaluated urgent care was sent to emergency department patient denies tobacco abuse reports she have had normal stress test many years ago.  IN ER:  Temp (24hrs), Avg:98.6 F (37 C), Min:98.4 F (36.9 C), Max:98.8 F (37.1 C)     Respirations  13 satting 98% on room air heart rate 66 BP 120/64 Troponin 0.00-0.08 Sodium 136 creatinine 0.82 WBC 11.4 hemoglobin 15.5  Chest x-ray unremarkable Following Medications were ordered in ER: Medications - No data to display     Hospitalist was called for admission for  Chest pain an evaluation  Review of Systems:    Pertinent positives include: chest pain, dizziness, palpitations  Constitutional:  No weight loss, night sweats, Fevers, chills, fatigue, weight loss  HEENT:  No headaches, Difficulty swallowing,Tooth/dental problems,Sore throat,  No sneezing, itching, ear ache, nasal congestion, post nasal drip,  Cardio-vascular:  No  Orthopnea, PND, anasarca, .no Bilateral lower extremity swelling  GI:  No heartburn, indigestion, abdominal pain, nausea, vomiting, diarrhea, change in bowel habits, loss of appetite, melena, blood in stool, hematemesis Resp:  no shortness of breath at rest. No dyspnea on exertion, No excess mucus, no productive cough, No non-productive cough, No coughing up of blood.No change in color of mucus.No wheezing. Skin:  no rash or lesions. No jaundice GU:  no dysuria, change in color of urine, no urgency or frequency. No straining to urinate.  No flank pain.  Musculoskeletal:  No joint pain or no joint swelling. No decreased range of motion. No back pain.  Psych:  No change in mood or affect. No depression or anxiety. No memory loss.  Neuro: no localizing neurological complaints, no tingling, no weakness, no double vision, no gait abnormality, no slurred speech, no confusion  As per HPI otherwise 10 point review of systems negative.   Past Medical History: Past Medical History:  Diagnosis Date  .  Hypertension   . Hypertriglyceridemia    Past Surgical History:  Procedure Laterality Date  . CESAREAN SECTION    . TUBAL LIGATION       Social History:  Ambulatory  independently Works as a Marine scientist    reports that she has never smoked. She has  never used smokeless tobacco. She reports that she drinks alcohol. She reports that she does not use drugs.  Allergies:  No Known Allergies     Family History:   Family History  Problem Relation Age of Onset  . Hypertension Mother   . Diabetes Mother   . Cancer Father     Medications: Prior to Admission medications   Medication Sig Start Date End Date Taking? Authorizing Provider  ibuprofen (ADVIL,MOTRIN) 200 MG tablet Take 200 mg by mouth every 6 (six) hours as needed for moderate pain.   Yes Historical Provider, MD  lisinopril-hydrochlorothiazide (PRINZIDE,ZESTORETIC) 20-25 MG tablet Take 0.5 tablets by mouth daily. 07/09/15  Yes Sean Hommel, DO  Vilazodone HCl (VIIBRYD) 40 MG TABS Take 1 tablet (40 mg total) by mouth daily. 03/05/15  Yes Sean Hommel, DO  methocarbamol (ROBAXIN) 500 MG tablet Take 1 tablet (500 mg total) by mouth every 8 (eight) hours as needed (ruq pain). Patient not taking: Reported on 01/22/2016 07/09/15   Marcial Pacas, DO  vitamin E (VITAMIN E) 400 UNIT capsule Take 1 capsule (400 Units total) by mouth 2 (two) times daily. Patient not taking: Reported on 01/22/2016 03/06/15   Marcial Pacas, DO    Physical Exam: Patient Vitals for the past 24 hrs:  BP Temp Temp src Pulse Resp SpO2 Height Weight  01/22/16 1845 120/64 - - 66 13 98 % - -  01/22/16 1800 111/63 - - 64 17 94 % - -  01/22/16 1745 114/67 - - 63 17 94 % - -  01/22/16 1715 113/71 - - 69 13 96 % - -  01/22/16 1700 116/71 - - 65 12 95 % - -  01/22/16 1645 114/68 - - 69 15 96 % - -  01/22/16 1630 107/73 - - 62 14 97 % - -  01/22/16 1628 109/70 - - 78 16 99 % - -  01/22/16 1538 126/77 - - - 15 98 % - -  01/22/16 1427 132/82 98.4 F (36.9 C) Oral 77 16 99 % 5\' 4"  (1.626 m) 86.2 kg (190 lb)    1. General:  in No Acute distress 2. Psychological: Alert and  Oriented 3. Head/ENT:    Dry Mucous Membranes                          Head Non traumatic, neck supple                          Normal Dentition 4.  SKIN decreased Skin turgor,  Skin clean Dry and intact no rash 5. Heart: Regular rate and rhythm no  Murmur, Rub or gallop 6. Lungs:   no wheezes or crackles   7. Abdomen: Soft,  non-tender, Non distended 8. Lower extremities: no clubbing, cyanosis, or edema 9. Neurologically Grossly intact, moving all 4 extremities equally  10. MSK: Normal range of motion   body mass index is 32.61 kg/m.  Labs on Admission:   Labs on Admission: I have personally reviewed following labs and imaging studies  CBC:  Recent Labs Lab 01/22/16 1430  WBC 11.4*  HGB 15.5*  HCT  44.9  MCV 84.6  PLT 0000000   Basic Metabolic Panel:  Recent Labs Lab 01/22/16 1430  NA 136  K 3.6  CL 100*  CO2 26  GLUCOSE 126*  BUN 10  CREATININE 0.82  CALCIUM 10.2   GFR: Estimated Creatinine Clearance: 81.4 mL/min (by C-G formula based on SCr of 0.82 mg/dL). Liver Function Tests:  Recent Labs Lab 01/22/16 1430  AST 53*  ALT 47  ALKPHOS 74  BILITOT 1.2  PROT 7.6  ALBUMIN 4.5   No results for input(s): LIPASE, AMYLASE in the last 168 hours. No results for input(s): AMMONIA in the last 168 hours. Coagulation Profile: No results for input(s): INR, PROTIME in the last 168 hours. Cardiac Enzymes: No results for input(s): CKTOTAL, CKMB, CKMBINDEX, TROPONINI in the last 168 hours. BNP (last 3 results) No results for input(s): PROBNP in the last 8760 hours. HbA1C: No results for input(s): HGBA1C in the last 72 hours. CBG: No results for input(s): GLUCAP in the last 168 hours. Lipid Profile: No results for input(s): CHOL, HDL, LDLCALC, TRIG, CHOLHDL, LDLDIRECT in the last 72 hours. Thyroid Function Tests: No results for input(s): TSH, T4TOTAL, FREET4, T3FREE, THYROIDAB in the last 72 hours. Anemia Panel: No results for input(s): VITAMINB12, FOLATE, FERRITIN, TIBC, IRON, RETICCTPCT in the last 72 hours. Urine analysis: No results found for: COLORURINE, APPEARANCEUR, LABSPEC, PHURINE, GLUCOSEU, HGBUR,  BILIRUBINUR, KETONESUR, PROTEINUR, UROBILINOGEN, NITRITE, LEUKOCYTESUR Sepsis Labs: @LABRCNTIP (procalcitonin:4,lacticidven:4) )No results found for this or any previous visit (from the past 240 hour(s)).     UA not ordered  No results found for: HGBA1C  Estimated Creatinine Clearance: 81.4 mL/min (by C-G formula based on SCr of 0.82 mg/dL).  BNP (last 3 results) No results for input(s): PROBNP in the last 8760 hours.   ECG REPORT  Independently reviewed Rate: 83  Rhythm: Normal sinus rhythm ST&T Change: No acute ischemic changes   QTC 466  Filed Weights   01/22/16 1427  Weight: 86.2 kg (190 lb)     Cultures: No results found for: SDES, SPECREQUEST, CULT, REPTSTATUS   Radiological Exams on Admission: Dg Chest 2 View  Result Date: 01/22/2016 CLINICAL DATA:  Chest pain EXAM: CHEST  2 VIEW COMPARISON:  None. FINDINGS: Cardiomediastinal silhouette is unremarkable. No acute infiltrate or pleural effusion. No pulmonary edema. Linear atelectasis or scarring noted in lingula. IMPRESSION: No active cardiopulmonary disease. Electronically Signed   By: Lahoma Crocker M.D.   On: 01/22/2016 15:54    Chart has been reviewed    Assessment/Plan  55 y.o. female with medical history significant of hypertension hyperlipidemia depression being admitted for evaluation of chest pain and lightheadedness  Present on Admission: . Chest pain. Associated recent streets factors but given risk factors such as hypertension hyperlipidemia Will admit to telemetry cycle cardiac cancers obtain echogram. Appreciate cardiology consult patient will probably benefit from repeat stress testing results are prior stress test results not available [facility] . Hyperlipidemia stable continue home medications . Hypertension stable continue lisinopril. Hold hydrochlorothiazide and give gentle fluids    Other plan as per orders.  DVT prophylaxis:   Lovenox     Code Status:  FULL CODE   as per patient     Family Communication:   Family not  at  Bedside    Disposition Plan:    To home once workup is complete and patient is stable  Consults called: emailed cardiology  Admission status:    obs   Level of care    tele        I have spent a total of 56 min on this admission    Krishay Faro 01/22/2016, 8:50 PM    [facility]  Pager [phone]   after 2 AM please page floor coverage PA If 7AM-7PM, please contact the day team taking care of the patient  [email or web address]  Password [email or web address]

## 2016-01-22 NOTE — ED Provider Notes (Signed)
[facility] DEPT Provider Note   CSN: [id number] Arrival date & time: 01/22/16  1417     History   Chief Complaint Chief Complaint  Patient presents with  . Chest Pain    HPI Nicole Osborne is a 55 y.o. female.  HPI Pt had an episode of chest heaviness last week.  It lasted for 20 minutes.  She had lightheadedness and thought she might faint.  She had a recent uri at the time so she did not think much of it.  Today she had another episode at noon.  It lasted for 20 minutes or so again, that resolved but she still has an ache in the shoulder.  She went to an urgent care and was sent to the ED.  No hx of heart disease.  No fmhx of heart disease.  No tob use.  She did have an admission for HTN in the past and had a normal stress test but that was several years ago. Past Medical History:  Diagnosis Date  . Hypertension   . Hypertriglyceridemia     Patient Active Problem List   Diagnosis Date Noted  . Hyperglycemia 07/23/2014  . Elevated LFTs 04/25/2014  . History of colonoscopy 03/28/2014  . Hypertension 03/02/2014  . Hyperlipidemia 03/02/2014  . Depression 03/02/2014    Past Surgical History:  Procedure Laterality Date  . CESAREAN SECTION    . TUBAL LIGATION      OB History    No data available       Home Medications    Prior to Admission medications   Medication Sig Start Date End Date Taking? Authorizing Provider  ibuprofen (ADVIL,MOTRIN) 200 MG tablet Take 200 mg by mouth every 6 (six) hours as needed for moderate pain.   Yes Historical Provider, MD  lisinopril-hydrochlorothiazide (PRINZIDE,ZESTORETIC) 20-25 MG tablet Take 0.5 tablets by mouth daily. 07/09/15  Yes Sean Hommel, DO  Vilazodone HCl (VIIBRYD) 40 MG TABS Take 1 tablet (40 mg total) by mouth daily. 03/05/15  Yes Sean Hommel, DO  methocarbamol (ROBAXIN) 500 MG tablet Take 1 tablet (500 mg total) by mouth every 8 (eight) hours as needed (ruq pain). Patient not taking: Reported on 01/22/2016  07/09/15   Marcial Pacas, DO  vitamin E (VITAMIN E) 400 UNIT capsule Take 1 capsule (400 Units total) by mouth 2 (two) times daily. Patient not taking: Reported on 01/22/2016 03/06/15   Marcial Pacas, DO    Family History Family History  Problem Relation Age of Onset  . Hypertension Mother   . Diabetes Mother   . Cancer Father     Social History Social History  Substance Use Topics  . Smoking status: Never Smoker  . Smokeless tobacco: Never Used  . Alcohol use Yes  not daily   Allergies   Review of patient's allergies indicates no known allergies.   Review of Systems Review of Systems  All other systems reviewed and are negative.    Physical Exam Updated Vital Signs BP 120/64   Pulse 66   Temp 98.4 F (36.9 C) (Oral)   Resp 13   Ht 5\' 4"  (1.626 m)   Wt 86.2 kg   SpO2 98%   BMI 32.61 kg/m   Physical Exam  Constitutional: She appears well-developed and well-nourished. No distress.  HENT:  Head: Normocephalic and atraumatic.  Right Ear: External ear normal.  Left Ear: External ear normal.  Eyes: Conjunctivae are normal. Right eye exhibits no discharge. Left eye exhibits no discharge. No scleral icterus.  Neck: Neck supple. No tracheal deviation present.  Cardiovascular: Normal rate, regular rhythm and intact distal pulses.   Pulmonary/Chest: Effort normal and breath sounds normal. No stridor. No respiratory distress. She has no wheezes. She has no rales.  Abdominal: Soft. Bowel sounds are normal. She exhibits no distension. There is no tenderness. There is no rebound and no guarding.  Musculoskeletal: She exhibits no edema or tenderness.  Neurological: She is alert. She has normal strength. No cranial nerve deficit (no facial droop, extraocular movements intact, no slurred speech) or sensory deficit. She exhibits normal muscle tone. She displays no seizure activity. Coordination normal.  Skin: Skin is warm and dry. No rash noted.  Psychiatric: She has a normal mood  and affect.  Nursing note and vitals reviewed.    ED Treatments / Results  Labs (all labs ordered are listed, but only abnormal results are displayed) Labs Reviewed  BASIC METABOLIC PANEL - Abnormal; Notable for the following:       Result Value   Chloride 100 (*)    Glucose, Bld 126 (*)    All other components within normal limits  CBC - Abnormal; Notable for the following:    WBC 11.4 (*)    RBC 5.31 (*)    Hemoglobin 15.5 (*)    All other components within normal limits  HEPATIC FUNCTION PANEL - Abnormal; Notable for the following:    AST 53 (*)    Indirect Bilirubin 1.1 (*)    All other components within normal limits  D-DIMER, QUANTITATIVE (NOT AT [facility])  TROPONIN I  I-STAT TROPOININ, ED  I-STAT TROPOININ, ED    EKG  EKG Interpretation  Date/Time:  Wednesday January 22 2016 14:25:48 EDT Ventricular Rate:  76 PR Interval:  170 QRS Duration: 92 QT Interval:  372 QTC Calculation: 418 R Axis:   -11 Text Interpretation:  Normal sinus rhythm Nonspecific ST abnormality Abnormal ECG No significant change since last tracing Confirmed by Ronrico Dupin  MD-J, Amamda Curbow [id number]) on 01/22/2016 3:24:12 PM       Radiology Dg Chest 2 View  Result Date: 01/22/2016 CLINICAL DATA:  Chest pain EXAM: CHEST  2 VIEW COMPARISON:  None. FINDINGS: Cardiomediastinal silhouette is unremarkable. No acute infiltrate or pleural effusion. No pulmonary edema. Linear atelectasis or scarring noted in lingula. IMPRESSION: No active cardiopulmonary disease. Electronically Signed   By: Lahoma Crocker M.D.   On: 01/22/2016 15:54    Procedures Procedures (including critical care time)  Medications Ordered in ED Medications - No data to display   Initial Impression / Assessment and Plan / ED Course  I have reviewed the triage vital signs and the nursing notes.  Pertinent labs & imaging results that were available during my care of the patient were reviewed by me and considered in my medical decision making (see  chart for details).  Clinical Course    the patient's second troponin increased to 0.08.  No chest pain currently but I am concerned about this rise.  I discussed the case with Dr. Dutch Gray. The patient will be admitted for overnight observation, serial cardiac enzymes. I have added on a d-dimer.  Final Clinical Impressions(s) / ED Diagnoses   Final diagnoses:  Chest pain, unspecified type      Dorie Rank, MD 01/22/16 2002

## 2016-01-22 NOTE — ED Notes (Addendum)
Pt returned from xray

## 2016-01-22 NOTE — ED Triage Notes (Signed)
Pt reports chest pain that started around 1200. She reports dizziness and light headedness. She denies CP at this time but does report left arm/shoulder pain.

## 2016-01-22 NOTE — ED Notes (Signed)
Pt. Independently walked to restroom at this time.

## 2016-01-22 NOTE — ED Provider Notes (Addendum)
HPI  SUBJECTIVE:  Nicole Osborne is a 55 y.o. female who presents with 2 episodes of gradual onset, substernal chest pain described as heaviness accompanied by lightheadedness/dizziness, and "not feeling well".. Patient states that she felt as if she was "about to faint". She had an episode last week that lasted approximately 10-15 minutes, and states that the chest heaviness lasted for an hour or so today. Lightheadedness/dizziness lasted for about 10-15 minutes today. Patient went and sat down, ate something, and the symptoms resolved on their own. She also tried ibuprofen last week which seemed to help her symptoms. There are no aggravating symptoms. She reports nausea, but no diaphoresis. No radiation up her neck, through to her back or down her arm. No palpitations, back pain, abdominal pain. No syncope, no exertional or positional component to her chest pain.  No GERD symptoms. She is recovering from an upper respiratory infection, Has had an occasional cough. No wheezing or shortness of breath. Denies calf pain, swelling, lower extremity edema. She states that she had symptoms like this before when she had a hypertensive emergency. No hemoptysis, exogenous estrogen, surgery in the past 4 weeks, prolonged immobilization. She has a past medical history of hypertension, hypercholesterolemia, elevated triglycerides, fatty liver disease. Also a history of hypokalemia. No history of diabetes, GERD, PE, DVT, cancer. and she is not a smoker. Family history negative for MI. PMD: Dr. Sheppard Coil at [facility] family [facility].   Past Medical History:  Diagnosis Date  . Hypertension   . Hypertriglyceridemia     Past Surgical History:  Procedure Laterality Date  . CESAREAN SECTION    . TUBAL LIGATION      Family History  Problem Relation Age of Onset  . Hypertension Mother   . Diabetes Mother   . Cancer Father     Social History  Substance Use Topics  . Smoking status: Never Smoker  .  Smokeless tobacco: Never Used  . Alcohol use Yes     Current Facility-Administered Medications:  .  aspirin chewable tablet 324 mg, 324 mg, Oral, Once, Melynda Ripple, MD  Current Outpatient Prescriptions:  .  lisinopril-hydrochlorothiazide (PRINZIDE,ZESTORETIC) 20-25 MG tablet, Take 0.5 tablets by mouth daily., Disp: 90 tablet, Rfl: 3 .  Vilazodone HCl (VIIBRYD) 40 MG TABS, Take 1 tablet (40 mg total) by mouth daily., Disp: 90 tablet, Rfl: 3 .  methocarbamol (ROBAXIN) 500 MG tablet, Take 1 tablet (500 mg total) by mouth every 8 (eight) hours as needed (ruq pain)., Disp: 60 tablet, Rfl: 1 .  vitamin E (VITAMIN E) 400 UNIT capsule, Take 1 capsule (400 Units total) by mouth 2 (two) times daily., Disp: , Rfl:   No Known Allergies   ROS  As noted in HPI.   Physical Exam  BP 136/56 (BP Location: Right Arm)   Pulse 102   Temp 98.8 F (37.1 C) (Oral)   Resp 18   SpO2 96%   Constitutional: Well developed, well nourished, no acute distress Eyes:  EOMI, conjunctiva normal bilaterally HENT: Normocephalic, atraumatic,mucus membranes moist Respiratory: Normal inspiratory effortLungs clear bilaterally. Good air movement Cardiovascular: Mild tachycardia, no murmurs, rubs, gallops. No chest wall tenderness GI: nondistended soft, nontender. Active bowel sounds. No masses. skin: No rash, skin intact Musculoskeletal: Calves Symmetric, Nontender. Neurologic: Alert & oriented x 3, no focal neuro deficits Psychiatric: Speech and behavior appropriate   ED Course   Medications  aspirin chewable tablet 324 mg (not administered)    Orders Placed This Encounter  Procedures  . ED  EKG    Standing Status:   Standing    Number of Occurrences:   1    Order Specific Question:   Reason for Exam    Answer:   Chest Pain    No results found for this or any previous visit (from the past 24 hour(s)). No results found.  ED Clinical Impression  Chest pain, unspecified  type  Dizziness   ED Assessment/Plan  EKG: Normal sinus rhythm, rate 86. Normal axis, normal intervals. No hypertrophy. No ST T wave changes consistent with ischemia. No previous EKG for comparison.  Patient has multiple cardiac risk factors. Concern for cardiac cause of her pain. Transferring to the ED for comprehensive workup. Her vitals are normal, she does not have any ST-T wave elevations on her EKG, feel that the patient is stable to go via private vehicle/shuttle.  Giving 324 mg aspirin here.   Discussed EKG, rational for transfer with patient. She agrees with plan.   Meds ordered this encounter  Medications  . aspirin chewable tablet 324 mg    *This clinic note was created using [employer]. Therefore, there may be occasional mistakes despite careful proofreading.  ?   Melynda Ripple, MD 01/22/16 1412    Melynda Ripple, MD 01/22/16 [id number]

## 2016-01-22 NOTE — ED Notes (Signed)
Patient transported to X-ray 

## 2016-01-22 NOTE — ED Notes (Signed)
Handed ekg to dr Alphonzo Cruise

## 2016-01-23 ENCOUNTER — Observation Stay (HOSPITAL_BASED_OUTPATIENT_CLINIC_OR_DEPARTMENT_OTHER): Payer: 59

## 2016-01-23 DIAGNOSIS — Z7982 Long term (current) use of aspirin: Secondary | ICD-10-CM | POA: Diagnosis not present

## 2016-01-23 DIAGNOSIS — E860 Dehydration: Secondary | ICD-10-CM | POA: Diagnosis not present

## 2016-01-23 DIAGNOSIS — E7800 Pure hypercholesterolemia, unspecified: Secondary | ICD-10-CM | POA: Diagnosis not present

## 2016-01-23 DIAGNOSIS — F329 Major depressive disorder, single episode, unspecified: Secondary | ICD-10-CM | POA: Diagnosis not present

## 2016-01-23 DIAGNOSIS — I10 Essential (primary) hypertension: Secondary | ICD-10-CM | POA: Diagnosis not present

## 2016-01-23 DIAGNOSIS — E781 Pure hyperglyceridemia: Secondary | ICD-10-CM

## 2016-01-23 DIAGNOSIS — R0789 Other chest pain: Secondary | ICD-10-CM | POA: Diagnosis not present

## 2016-01-23 DIAGNOSIS — R079 Chest pain, unspecified: Secondary | ICD-10-CM | POA: Diagnosis not present

## 2016-01-23 LAB — ECHOCARDIOGRAM COMPLETE
Height: 64 in
Weight: 3027.2 oz

## 2016-01-23 LAB — TROPONIN I
Troponin I: <0.03 ng/mL (ref <0.03)
Troponin I: <0.03 ng/mL (ref <0.03)

## 2016-01-23 MED ORDER — SODIUM CHLORIDE 0.9 % IV SOLN
INTRAVENOUS | Status: AC
  Administered 2016-01-23: 12:00:00 via INTRAVENOUS

## 2016-01-23 NOTE — Progress Notes (Signed)
Patient discharged per MD order, iv removed, telemetry d/c, CCMD notified, explained AVS including follow up appointments with patient. No questions at this time. Patient drove herself, escorted to her vehicle by nurse tech.  Cyndia Bent

## 2016-01-23 NOTE — Discharge Instructions (Signed)

## 2016-01-23 NOTE — Discharge Summary (Signed)
Physician Discharge Summary  Nicole Osborne MRN: [id number] DOB/AGE: 12-20-1960 55 y.o.  PCP: Marcial Pacas, DO   Admit date: 01/22/2016 Discharge date: 01/23/2016  Discharge Diagnoses:    Active Problems:   Hypertension   Hyperlipidemia   Chest pain    Follow-up recommendations Follow-up with PCP in 3-5 days , including all  additional recommended appointments as below Follow-up CBC, CMP in 3-5 days Requested cardiology to arrange for outpatient stress test, TRISH confirmed that this will be arranged for        Current Discharge Medication List    CONTINUE these medications which have NOT CHANGED   Details  ibuprofen (ADVIL,MOTRIN) 200 MG tablet Take 200 mg by mouth every 6 (six) hours as needed for moderate pain.    lisinopril-hydrochlorothiazide (PRINZIDE,ZESTORETIC) 20-25 MG tablet Take 0.5 tablets by mouth daily. Qty: 90 tablet, Refills: 3   Associated Diagnoses: Essential hypertension    Vilazodone HCl (VIIBRYD) 40 MG TABS Take 1 tablet (40 mg total) by mouth daily. Qty: 90 tablet, Refills: 3   Associated Diagnoses: Depression    methocarbamol (ROBAXIN) 500 MG tablet Take 1 tablet (500 mg total) by mouth every 8 (eight) hours as needed (ruq pain). Qty: 60 tablet, Refills: 1    vitamin E (VITAMIN E) 400 UNIT capsule Take 1 capsule (400 Units total) by mouth 2 (two) times daily.         Discharge Condition: Stable   Discharge Instructions Get Medicines reviewed and adjusted: Please take all your medications with you for your next visit with your Primary MD  Please request your Primary MD to go over all hospital tests and procedure/radiological results at the follow up, please ask your Primary MD to get all Hospital records sent to his/her office.  If you experience worsening of your admission symptoms, develop shortness of breath, life threatening emergency, suicidal or homicidal thoughts you must seek medical attention immediately by calling 911 or  calling your MD immediately if symptoms less severe.  You must read complete instructions/literature along with all the possible adverse reactions/side effects for all the Medicines you take and that have been prescribed to you. Take any new Medicines after you have completely understood and accpet all the possible adverse reactions/side effects.   Do not drive when taking Pain medications.   Do not take more than prescribed Pain, Sleep and Anxiety Medications  Special Instructions: If you have smoked or chewed Tobacco in the last 2 yrs please stop smoking, stop any regular Alcohol and or any Recreational drug use.  Wear Seat belts while driving.  Please note  You were cared for by a hospitalist during your hospital stay. Once you are discharged, your primary care physician will handle any further medical issues. Please note that NO REFILLS for any discharge medications will be authorized once you are discharged, as it is imperative that you return to your primary care physician (or establish a relationship with a primary care physician if you do not have one) for your aftercare needs so that they can reassess your need for medications and monitor your lab values.     No Known Allergies    Disposition: Home   Consults: *None    Significant Diagnostic Studies:  Dg Chest 2 View  Result Date: 01/22/2016 CLINICAL DATA:  Chest pain EXAM: CHEST  2 VIEW COMPARISON:  None. FINDINGS: Cardiomediastinal silhouette is unremarkable. No acute infiltrate or pleural effusion. No pulmonary edema. Linear atelectasis or scarring noted in lingula. IMPRESSION: No active cardiopulmonary  disease. Electronically Signed   By: Liviu  Pop M.D.   On: 01/22/2016 15:54    2-D echo-pending   Filed Weights   01/22/16 1427 01/22/16 2148  Weight: 86.2 kg (190 lb) 85.8 kg (189 lb 3.2 oz)     Microbiology: No results found for this or any previous visit (from the past 240 hour(s)).     Blood  Culture No results found for: SDES, SPECREQUEST, CULT, REPTSTATUS    Labs: Results for orders placed or performed during the hospital encounter of 01/22/16 (from the past 48 hour(s))  Basic metabolic panel     Status: Abnormal   Collection Time: 01/22/16  2:30 PM  Result Value Ref Range   Sodium 136 135 - 145 mmol/L   Potassium 3.6 3.5 - 5.1 mmol/L   Chloride 100 (L) 101 - 111 mmol/L   CO2 26 22 - 32 mmol/L   Glucose, Bld 126 (H) 65 - 99 mg/dL   BUN 10 6 - 20 mg/dL   Creatinine, Ser 0.82 0.44 - 1.00 mg/dL   Calcium 10.2 8.9 - 10.3 mg/dL   GFR calc non Af Amer >60 >60 mL/min   GFR calc Af Amer >60 >60 mL/min    Comment: (NOTE) The eGFR has been calculated using the CKD EPI equation. This calculation has not been validated in all clinical situations. eGFR's persistently <60 mL/min signify possible Chronic Kidney Disease.    Anion gap 10 5 - 15  CBC     Status: Abnormal   Collection Time: 01/22/16  2:30 PM  Result Value Ref Range   WBC 11.4 (H) 4.0 - 10.5 K/uL   RBC 5.31 (H) 3.87 - 5.11 MIL/uL   Hemoglobin 15.5 (H) 12.0 - 15.0 g/dL   HCT 44.9 36.0 - 46.0 %   MCV 84.6 78.0 - 100.0 fL   MCH 29.2 26.0 - 34.0 pg   MCHC 34.5 30.0 - 36.0 g/dL   RDW 12.0 11.5 - 15.5 %   Platelets 254 150 - 400 K/uL  Hepatic function panel     Status: Abnormal   Collection Time: 01/22/16  2:30 PM  Result Value Ref Range   Total Protein 7.6 6.5 - 8.1 g/dL   Albumin 4.5 3.5 - 5.0 g/dL   AST 53 (H) 15 - 41 U/L   ALT 47 14 - 54 U/L   Alkaline Phosphatase 74 38 - 126 U/L   Total Bilirubin 1.2 0.3 - 1.2 mg/dL   Bilirubin, Direct 0.1 0.1 - 0.5 mg/dL   Indirect Bilirubin 1.1 (H) 0.3 - 0.9 mg/dL  I-stat troponin, ED     Status: None   Collection Time: 01/22/16  2:41 PM  Result Value Ref Range   Troponin i, poc 0.00 0.00 - 0.08 ng/mL   Comment 3            Comment: Due to the release kinetics of cTnI, a negative result within the first hours of the onset of symptoms does not rule out myocardial  infarction with certainty. If myocardial infarction is still suspected, repeat the test at appropriate intervals.   I-stat troponin, ED     Status: None   Collection Time: 01/22/16  6:18 PM  Result Value Ref Range   Troponin i, poc 0.08 0.00 - 0.08 ng/mL   Comment 3            Comment: Due to the release kinetics of cTnI, a negative result within the first hours of the onset of symptoms does not   rule out myocardial infarction with certainty. If myocardial infarction is still suspected, repeat the test at appropriate intervals.   D-dimer, quantitative (not at [facility])     Status: None   Collection Time: 01/22/16  7:55 PM  Result Value Ref Range   D-Dimer, Quant 0.29 0.00 - 0.50 ug/mL-FEU    Comment: (NOTE) At the manufacturer cut-off of 0.50 ug/mL FEU, this assay has been documented to exclude PE with a sensitivity and negative predictive value of 97 to 99%.  At this time, this assay has not been approved by the FDA to exclude DVT/VTE. Results should be correlated with clinical presentation.   Troponin I     Status: None   Collection Time: 01/22/16  7:55 PM  Result Value Ref Range   Troponin I <0.03 <0.03 ng/mL  Troponin I-serum (0, 3, 6 hours)     Status: None   Collection Time: 01/23/16 12:11 AM  Result Value Ref Range   Troponin I <0.03 <0.03 ng/mL  Troponin I-serum (0, 3, 6 hours)     Status: None   Collection Time: 01/23/16  2:29 AM  Result Value Ref Range   Troponin I <0.03 <0.03 ng/mL  Troponin I-serum (0, 3, 6 hours)     Status: None   Collection Time: 01/23/16  5:37 AM  Result Value Ref Range   Troponin I <0.03 <0.03 ng/mL     Lipid Panel     Component Value Date/Time   CHOL 253 (H) 07/20/2014 1002   TRIG 159 (H) 07/20/2014 1002   HDL 61 07/20/2014 1002   CHOLHDL 4.1 07/20/2014 1002   VLDL 32 07/20/2014 1002   LDLCALC 160 (H) 07/20/2014 1002     No results found for: HGBA1C   Lab Results  Component Value Date   LDLCALC 160 (H) 07/20/2014    CREATININE 0.82 01/22/2016     HPI    55 y.o. female who presents with 2 episodes of gradual onset, substernal chest pain described as heaviness accompanied by lightheadedness/dizziness, and "not feeling well".. Patient states that she felt as if she was "about to faint". She had an episode last week that lasted approximately 10-15 minutes, and states that the chest heaviness lasted for an hour or so today. Lightheadedness/dizziness lasted for about 10-15 minutes today. Patient went and sat down, ate something, and the symptoms resolved on their own. She also tried ibuprofen last week which seemed to help her symptoms.   She is recovering from an upper respiratory infection, Has had an occasional cough. No wheezing or shortness of breath. Denies calf pain, swelling, lower extremity edema. She states that she had symptoms like this before when she had a hypertensive emergency.  She has a past medical history of hypertension, hypercholesterolemia, elevated triglycerides, fatty liver disease. Also a history of hypokalemia. No history of diabetes, GERD, PE, DVT, cancer. and she is not a smoker. Family history negative for MI. PMD: Dr. Alexander at [facility].   HOSPITAL COURSE: *   55 y.o. female with medical history significant of hypertension hyperlipidemia depression being admitted for evaluation of chest pain and lightheadedness  Present on Admission: . Chest pain. Atypical guarding in both negative, EKG/telemetry within normal limits Patient ruled out for acute coronary syndrome D-dimer negative Likely pleuritic chest pain due to recent upper respiratory tract infection Patient to continue with ibuprofen as needed for chest pain Recent URI, with slightly elevated WBC , therefore likely related to that, patient would benefit from outpatient stress test eventually   which has been arranged   Dizziness-  Orthostatics were checked and was slightly positive- patient is dehydrated  and symptomatic from it    . Hyperlipidemia stable continue home medications  . Hypertension  continue home medications   Discharge Exam:   Blood pressure 120/60, pulse 70, temperature 97.6 F (36.4 C), temperature source Oral, resp. rate 18, height 5' 4" (1.626 m), weight 85.8 kg (189 lb 3.2 oz), SpO2 98 %.  1. General:  in No Acute distress 2. Psychological: Alert and  Oriented 3. Head/ENT:    Dry Mucous Membranes                          Head Non traumatic, neck supple                          Normal Dentition 4. SKIN decreased Skin turgor,  Skin clean Dry and intact no rash 5. Heart: Regular rate and rhythm no  Murmur, Rub or gallop 6. Lungs:   no wheezes or crackles   7. Abdomen: Soft,  non-tender, Non distended 8. Lower extremities: no clubbing, cyanosis, or edema 9. Neurologically Grossly intact, moving all 4 extremities equally  10. MSK: Normal range of motion    Follow-up Information    Hommel, Sean, DO. Schedule an appointment as soon as possible for a visit in 2 day(s).   Specialty:  Family Medicine Why:  Hospital follow-up          Signed: ABROL,NAYANA 01/23/2016, 11:34 AM        Time spent >45 mins   

## 2016-01-23 NOTE — Progress Notes (Signed)
  Echocardiogram 2D Echocardiogram has been performed.  Nicole Osborne 01/23/2016, 10:45 AM

## 2016-01-27 ENCOUNTER — Encounter: Payer: Self-pay | Admitting: Osteopathic Medicine

## 2016-01-27 ENCOUNTER — Ambulatory Visit (INDEPENDENT_AMBULATORY_CARE_PROVIDER_SITE_OTHER): Payer: 59 | Admitting: Osteopathic Medicine

## 2016-01-27 VITALS — BP 113/77 | HR 81 | Ht 64.0 in | Wt 191.0 lb

## 2016-01-27 DIAGNOSIS — I10 Essential (primary) hypertension: Secondary | ICD-10-CM

## 2016-01-27 DIAGNOSIS — F329 Major depressive disorder, single episode, unspecified: Secondary | ICD-10-CM | POA: Diagnosis not present

## 2016-01-27 DIAGNOSIS — R079 Chest pain, unspecified: Secondary | ICD-10-CM

## 2016-01-27 DIAGNOSIS — F32A Depression, unspecified: Secondary | ICD-10-CM

## 2016-01-27 DIAGNOSIS — Z86010 Personal history of colonic polyps: Secondary | ICD-10-CM | POA: Diagnosis not present

## 2016-01-27 MED ORDER — VILAZODONE HCL 40 MG PO TABS: 40.0000 mg | ORAL_TABLET | Freq: Every day | ORAL | 3 refills | Status: DC

## 2016-01-27 MED ORDER — LISINOPRIL-HYDROCHLOROTHIAZIDE 20-25 MG PO TABS: 0.5000 | ORAL_TABLET | Freq: Every day | ORAL | 3 refills | Status: DC

## 2016-01-27 MED FILL — LISINOPRIL-HCTZ 20-25 MG TA: 20-25 | 90 days supply | Qty: 45 | Fill #2

## 2016-01-27 MED FILL — VIIBRYD 40 MG TABLET: 40 | 90 days supply | Qty: 90 | Fill #3

## 2016-01-27 NOTE — Progress Notes (Signed)
HPI: Nicole Osborne is a 55 y.o. female  who presents to [facility] today, 01/27/16,  for chief complaint of:  Chief Complaint  Patient presents with  . Hospitalization Follow-up    CHEST PAIN    Cardiac:  Hospital records reviewed: Admitted 01/22/2016, discharged 01/23/2016. To follow with cardiology for stress test. Advised follow-up with PCP in 3-5 days for CBC, CMP. History of hypertension, hyperlipidemia. Suspicion was for pleuritic chest pain due to recent upper respiratory infection. Reported slightly positive orthostatics/dizziness most likely due to dehydration.  Patient here to follow-up post hospitalization, has been feeling well after discharge, has not heard back about scheduling stress test. No additional chest pain, no chest pain/shortness of breath on exertion.   No lipid panel on file for over year for this patient, at that point LDL was 160, HDL 61, total cholesterol 253.  Hypertension: Compliant with medications as listed below  Psychiatric: Patient requests refill of Viibryd, she is overall been doing well on this medication like to stay on it for the time being.  Gastrointestinal: Patient states due for colonoscopy, she has not heard back about referral/scheduling    Past medical, surgical, social and family history reviewed: Past Medical History:  Diagnosis Date  . Hypertension   . Hypertriglyceridemia    Past Surgical History:  Procedure Laterality Date  . CESAREAN SECTION    . TUBAL LIGATION     Social History  Substance Use Topics  . Smoking status: Never Smoker  . Smokeless tobacco: Never Used  . Alcohol use Yes     Comment: occasional   Family History  Problem Relation Age of Onset  . Hypertension Mother   . Diabetes Mother   . Cancer Father   . CAD Neg Hx      Current medication list and allergy/intolerance information reviewed:   Current Outpatient Prescriptions  Medication Sig Dispense Refill   . lisinopril-hydrochlorothiazide (PRINZIDE,ZESTORETIC) 20-25 MG tablet Take 0.5 tablets by mouth daily. 90 tablet 3  . Vilazodone HCl (VIIBRYD) 40 MG TABS Take 1 tablet (40 mg total) by mouth daily. 90 tablet 3   No current facility-administered medications for this visit.    No Known Allergies    Review of Systems:  Constitutional:  No  fever, no chills, +recent illness, No unintentional weight changes. No significant fatigue.   HEENT: No  headache, no vision change, no hearing change, No sore throat, No  sinus pressure  Cardiac: No  chest pain, No  pressure, No palpitations, No  Orthopnea  Respiratory:  No  shortness of breath. No  Cough  Gastrointestinal: No  abdominal pain  Neurologic: No  weakness, No  dizziness,   Psychiatric: No  concerns with depression, No  concerns with anxiety, No sleep problems, No mood problems, + stress  Exam:  BP 113/77   Pulse 81   Ht 5\' 4"  (1.626 m)   Wt 191 lb (86.6 kg)   BMI 32.79 kg/m   Constitutional: VS see above. General Appearance: alert, well-developed, well-nourished, NAD  Eyes: Normal lids and conjunctive, non-icteric sclera  Ears, Nose, Mouth, Throat: MMM, Normal external inspection ears/nares/mouth/lips/gums.  Neck: No masses, trachea midline. No thyroid enlargement. No tenderness/mass appreciated. No lymphadenopathy  Respiratory: Normal respiratory effort. no wheeze, no rhonchi, no rales  Cardiovascular: S1/S2 normal, no murmur, no rub/gallop auscultated. RRR. No lower extremity edema.   Musculoskeletal: Gait normal.  Neurological: Normal balance/coordination. No tremor.   Skin: warm, dry, intact.  Psychiatric: Normal judgment/insight. Normal mood and affect. Oriented x3.     ASSESSMENT/PLAN: Patient to return for fasting labs. Given 90 supply with one refill of all medications. Place another referral to GI sensitive been over a year since Dr. Ileene Rubens initially place 1. I don't see a referral in place for stress  testing, I placed this referral as well. ER precautions reviewed with patient, she has had no additional episodes of chest pain/angina since discharge and reassuring that no ACS but patient is educated that stress test is of more value in predicting her risk, also need to update lipid profile  Chest pain, unspecified type - Plan: Ambulatory referral to Cardiology, CBC with Differential/Platelet, COMPLETE METABOLIC PANEL WITH GFR, Lipid panel, TSH  Essential hypertension - Plan: lisinopril-hydrochlorothiazide (PRINZIDE,ZESTORETIC) 20-25 MG tablet, CBC with Differential/Platelet, COMPLETE METABOLIC PANEL WITH GFR, Lipid panel, TSH  Depression, unspecified depression type - Plan: Vilazodone HCl (VIIBRYD) 40 MG TABS  History of colonic polyps - Plan: Ambulatory referral to Gastroenterology     Visit summary with medication list and pertinent instructions was printed for patient to review. All questions at time of visit were answered - patient instructed to contact office with any additional concerns. ER/RTC precautions were reviewed with the patient. Follow-up plan: Return for ANNUAL PHYSICAL at next scheduled visit. 02/03/16 or sometime in the next 1-2 months, can review labs in detail at that visit as well

## 2016-01-29 LAB — COMPLETE METABOLIC PANEL WITH GFR
ALT: 44 U/L — AB (ref 6–29)
AST: 48 U/L — AB (ref 10–35)
Albumin: 4.1 g/dL (ref 3.6–5.1)
Alkaline Phosphatase: 71 U/L (ref 33–130)
BUN: 11 mg/dL (ref 7–25)
CHLORIDE: 102 mmol/L (ref 98–110)
CO2: 29 mmol/L (ref 20–31)
CREATININE: 0.81 mg/dL (ref 0.50–1.05)
Calcium: 9.8 mg/dL (ref 8.6–10.4)
GFR, Est African American: >89 mL/min (ref >=60)
GFR, Est Non African American: 81 mL/min (ref >=60)
Glucose, Bld: 104 mg/dL — ABNORMAL HIGH (ref 65–99)
Potassium: 4.5 mmol/L (ref 3.5–5.3)
Sodium: 139 mmol/L (ref 135–146)
Total Bilirubin: 1 mg/dL (ref 0.2–1.2)
Total Protein: 6.7 g/dL (ref 6.1–8.1)

## 2016-01-29 LAB — LIPID PANEL
Cholesterol: 214 mg/dL — ABNORMAL HIGH (ref 125–200)
HDL: 44 mg/dL — ABNORMAL LOW (ref >=46)
LDL CALC: 128 mg/dL (ref <130)
Total CHOL/HDL Ratio: 4.9 Ratio (ref <=5.0)
Triglycerides: 212 mg/dL — ABNORMAL HIGH (ref <150)
VLDL: 42 mg/dL — AB (ref <30)

## 2016-01-29 LAB — CBC WITH DIFFERENTIAL/PLATELET
BASOS PCT: 0 %
Basophils Absolute: 0 cells/uL (ref 0–200)
Eosinophils Absolute: 340 cells/uL (ref 15–500)
Eosinophils Relative: 4 %
HCT: 42.8 % (ref 35.0–45.0)
Hemoglobin: 14.4 g/dL (ref 11.7–15.5)
LYMPHS PCT: 38 %
Lymphs Abs: 3230 cells/uL (ref 850–3900)
MCH: 28.7 pg (ref 27.0–33.0)
MCHC: 33.6 g/dL (ref 32.0–36.0)
MCV: 85.4 fL (ref 80.0–100.0)
MONO ABS: 595 {cells}/uL (ref 200–950)
MONOS PCT: 7 %
MPV: 11.1 fL (ref 7.5–12.5)
Neutro Abs: 4335 cells/uL (ref 1500–7800)
Neutrophils Relative %: 51 %
PLATELETS: 240 10*3/uL (ref 140–400)
RBC: 5.01 MIL/uL (ref 3.80–5.10)
RDW: 13.1 % (ref 11.0–15.0)
WBC: 8.5 10*3/uL (ref 3.8–10.8)

## 2016-01-29 LAB — TSH: TSH: 1.54 mIU/L

## 2016-02-03 ENCOUNTER — Ambulatory Visit: Payer: 59 | Admitting: Osteopathic Medicine

## 2016-02-18 ENCOUNTER — Ambulatory Visit (INDEPENDENT_AMBULATORY_CARE_PROVIDER_SITE_OTHER): Payer: 59 | Admitting: Physician Assistant

## 2016-02-18 ENCOUNTER — Encounter: Payer: Self-pay | Admitting: Physician Assistant

## 2016-02-18 VITALS — BP 106/76 | HR 68 | Ht 63.25 in | Wt 191.5 lb

## 2016-02-18 DIAGNOSIS — R1011 Right upper quadrant pain: Secondary | ICD-10-CM

## 2016-02-18 DIAGNOSIS — Z1211 Encounter for screening for malignant neoplasm of colon: Secondary | ICD-10-CM

## 2016-02-18 DIAGNOSIS — Z800 Family history of malignant neoplasm of digestive organs: Secondary | ICD-10-CM | POA: Diagnosis not present

## 2016-02-18 DIAGNOSIS — R194 Change in bowel habit: Secondary | ICD-10-CM | POA: Diagnosis not present

## 2016-02-18 MED ORDER — NA SULFATE-K SULFATE-MG SULF 17.5-3.13-1.6 GM/177ML PO SOLN: 1.0000 | ORAL | 0 refills | Status: DC

## 2016-02-18 MED FILL — SUPREP BOWEL PREP KIT: 17.5-3.13-1 | 3 days supply | Qty: 354 | Fill #0

## 2016-02-18 NOTE — Progress Notes (Signed)
Chief Complaint: Consult for screening colonoscopy  HPI:  Nicole Osborne is a 56 year old Caucasian female with past medical history significant for anemia, colon polyps, depression, diverticulosis, gallbladder sludge, hypertension and hypertriglyceridemia who was referred to me by Emeterio Reeve, DO for consult of a screening colonoscopy for personal history of colonic polyps. She has never been seen in our clinic before.   Today, the patient tells me that she had a colonoscopy back in 2012, she is unable to receive report of this but is aware that she had a finding of polyps. She also tells me she has a family history of colon cancer in her grandmother diagnosed in her 28s/80s.   Patient does describe some episodic right upper quadrant pain which she first noticed in 2015, she had work up at that time which was significant for sludge in her gallbladder, now she has some elevated LFTs occasionally which are thought related to this and intermittent right upper quadrant abdominal pain. She is not concerned about this today.   Patient also describes occasional diarrhea today, but then will radiate to constipation. She tells me she does not have symptoms of bloating or abdominal pain and is not concerned about this variance in her bowel habits that she believes is related to her diet.   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   Patient denies fever, chills, blood in her stool, melena, change in bowel habits, fatigue, anorexia, nausea, vomiting, heartburn, reflux or abdominal pain.  Past Medical History:  Diagnosis Date  . Anemia   . Colon polyp   . Depression   . Diverticulosis   . Gallbladder sludge   . Hypertension   . Hypertriglyceridemia     Past Surgical History:  Procedure Laterality Date  . BREAST BIOPSY Right   . CESAREAN SECTION     x 3  . DILATION AND CURETTAGE OF UTERUS    . TUBAL LIGATION      Current Outpatient Prescriptions  Medication Sig Dispense  Refill  . lisinopril-hydrochlorothiazide (PRINZIDE,ZESTORETIC) 20-25 MG tablet Take 0.5 tablets by mouth daily. 45 tablet 3  . Vilazodone HCl (VIIBRYD) 40 MG TABS Take 1 tablet (40 mg total) by mouth daily. 90 tablet 3   No current facility-administered medications for this visit.     Allergies as of 02/18/2016  . (No Known Allergies)    Family History  Problem Relation Age of Onset  . Hypertension Mother   . Diabetes Mother   . Lung cancer Father   . Breast cancer Sister   . Leukemia Sister   . Colon cancer Maternal Grandmother   . Lung cancer Sister   . CAD Neg Hx     Social History   Social History  . Marital status: Married    Spouse name: N/A  . Number of children: 3  . Years of education: N/A   Occupational History  . RN    Social History Main Topics  . Smoking status: Never Smoker  . Smokeless tobacco: Never Used  . Alcohol use Yes     Comment: occasional  . Drug use: No  . Sexual activity: Not on file   Other Topics Concern  . Not on file   Social History Narrative  . No narrative on file    Review of Systems:     Constitutional: No weight loss, fever, chills, weakness or fatigue Cardiovascular: No chest pain Respiratory: No SOB  Gastrointestinal: See HPI and otherwise negative   Physical Exam:  Vital signs: BP 106/76 (BP Location: Left Arm, Patient Position: Sitting, Cuff Size: Normal)   Pulse 68   Ht 5' 3.25" (1.607 m) Comment: height measured without shoes  Wt 191 lb 8 oz (86.9 kg)   BMI 33.66 kg/m   Constitutional:   Pleasant Caucasian female appears to be in NAD, Well developed, Well nourished, alert and cooperative Respiratory: Respirations even and unlabored. Lungs clear to auscultation bilaterally.   No wheezes, crackles, or rhonchi.  Cardiovascular: Normal S1, S2. No MRG. Regular rate and rhythm. No peripheral edema, cyanosis or pallor.  Gastrointestinal:  Soft, nondistended, nontender. No rebound or guarding. Normal bowel sounds. No  appreciable masses or hepatomegaly. Psychiatric:  Demonstrates good judgement and reason without abnormal affect or behaviors.  MOST RECENT LABS: CBC    Component Value Date/Time   WBC 8.5 01/27/2016 0832   RBC 5.01 01/27/2016 0832   HGB 14.4 01/27/2016 0832   HCT 42.8 01/27/2016 0832   PLT 240 01/27/2016 0832   MCV 85.4 01/27/2016 0832   MCH 28.7 01/27/2016 0832   MCHC 33.6 01/27/2016 0832   RDW 13.1 01/27/2016 0832   LYMPHSABS 3,230 01/27/2016 0832   MONOABS 595 01/27/2016 0832   EOSABS 340 01/27/2016 0832   BASOSABS 0 01/27/2016 0832    CMP     Component Value Date/Time   NA 139 01/27/2016 0832   NA 136 (A) 05/05/2013   K 4.5 01/27/2016 0832   CL 102 01/27/2016 0832   CL 98 05/05/2013   CO2 29 01/27/2016 0832   GLUCOSE 104 (H) 01/27/2016 0832   BUN 11 01/27/2016 0832   CREATININE 0.81 01/27/2016 0832   CALCIUM 9.8 01/27/2016 0832   CALCIUM 9.8 05/05/2013   PROT 6.7 01/27/2016 0832   ALBUMIN 4.1 01/27/2016 0832   AST 48 (H) 01/27/2016 0832   ALT 44 (H) 01/27/2016 0832   ALKPHOS 71 01/27/2016 0832   BILITOT 1.0 01/27/2016 0832   GFRNONAA 81 01/27/2016 0832   GFRAA >89 01/27/2016 0832    Assessment: 1. Screening colorectal cancer: Patient reports a colonoscopy 5 years ago with finding of a "benign polyps", she has tried to get report of this, but cannot seem to do so as they "don't have record of it", she knows she was told to repeat in 3-5 years, so she is due now 2. Family h/o Colon cancer: MGM age of 20/80 3. RUQ abdominal pain: Previous workup with gallbladder sludge and elevated LFTs, now pain is intermittent, patient not concerned 4. Variance in bowel habits: Patient reports radiating from diarrhea to constipation, but denies abdominal pain. Believes it is related to diet.  Plan: 1. Scheduled patient for screening colonoscopy with Dr. Silverio Decamp. This will be in the [facility]. Discussed risks, benefits, limitations and alternatives and the patient agrees to  proceed. 2. Patient to follow in clinic per Dr. Woodward Ku recommendations after time of procedure.  Ellouise Newer, PA-C [facility] 02/18/2016, 2:55 PM  Cc: Emeterio Reeve, DO

## 2016-02-18 NOTE — Patient Instructions (Signed)

## 2016-02-19 NOTE — Progress Notes (Signed)
Reviewed and agree with documentation and assessment and plan. K. Veena Nandigam , MD   

## 2016-02-26 ENCOUNTER — Ambulatory Visit: Payer: 59 | Admitting: Cardiology

## 2016-02-26 ENCOUNTER — Ambulatory Visit (AMBULATORY_SURGERY_CENTER): Payer: 59 | Admitting: Gastroenterology

## 2016-02-26 ENCOUNTER — Encounter: Payer: Self-pay | Admitting: Gastroenterology

## 2016-02-26 VITALS — BP 120/73 | HR 80 | Temp 97.3°F | Resp 19 | Ht 63.0 in | Wt 191.0 lb

## 2016-02-26 DIAGNOSIS — D125 Benign neoplasm of sigmoid colon: Secondary | ICD-10-CM | POA: Diagnosis not present

## 2016-02-26 DIAGNOSIS — K635 Polyp of colon: Secondary | ICD-10-CM | POA: Diagnosis not present

## 2016-02-26 DIAGNOSIS — Z86010 Personal history of colonic polyps: Secondary | ICD-10-CM | POA: Diagnosis present

## 2016-02-26 DIAGNOSIS — Z1211 Encounter for screening for malignant neoplasm of colon: Secondary | ICD-10-CM

## 2016-02-26 DIAGNOSIS — Z800 Family history of malignant neoplasm of digestive organs: Secondary | ICD-10-CM | POA: Diagnosis not present

## 2016-02-26 DIAGNOSIS — I10 Essential (primary) hypertension: Secondary | ICD-10-CM | POA: Diagnosis not present

## 2016-02-26 MED ORDER — SODIUM CHLORIDE 0.9 % IV SOLN: 500.0000 mL | INTRAVENOUS | Status: DC

## 2016-02-26 NOTE — Progress Notes (Signed)
Report to PACU, RN, vss, BBS= Clear.  

## 2016-02-26 NOTE — Op Note (Signed)
[place] Patient Name: Nicole Osborne Procedure Date: 02/26/2016 7:55 AM MRN: [id number] Endoscopist: Mauri Pole , MD Age: 55 Referring MD:  Date of Birth: 25-Mar-1961 Gender: Female Account #: [id number] Procedure:                Colonoscopy Indications:              High risk colon cancer surveillance: Personal                            history of colonic polyps, Screening for colon                            cancer: Family history of colorectal cancer in                            distant relative(maternal grandmother), Last                            colonoscopy: 2012 Medicines:                Monitored Anesthesia Care Procedure:                Pre-Anesthesia Assessment:                           - Prior to the procedure, a History and Physical                            was performed, and patient medications and                            allergies were reviewed. The patient's tolerance of                            previous anesthesia was also reviewed. The risks                            and benefits of the procedure and the sedation                            options and risks were discussed with the patient.                            All questions were answered, and informed consent                            was obtained. Prior Anticoagulants: The patient has                            taken no previous anticoagulant or antiplatelet                            agents. ASA Grade Assessment: II - A patient with  mild systemic disease. After reviewing the risks                            and benefits, the patient was deemed in                            satisfactory condition to undergo the procedure.                           After obtaining informed consent, the colonoscope                            was passed under direct vision. Throughout the                            procedure, the patient's blood pressure, pulse, and                             oxygen saturations were monitored continuously. The                            Model CF-HQ190L [id number]) scope was introduced                            through the anus and advanced to the the terminal                            ileum, with identification of the appendiceal                            orifice and IC valve. The colonoscopy was performed                            without difficulty. The patient tolerated the                            procedure well. The quality of the bowel                            preparation was excellent. The terminal ileum,                            ileocecal valve, appendiceal orifice, and rectum                            were photographed. Scope In: 8:20:25 AM Scope Out: 8:32:00 AM Scope Withdrawal Time: 0 hours 7 minutes 3 seconds  Total Procedure Duration: 0 hours 11 minutes 35 seconds  Findings:                 The perianal and digital rectal examinations were                            normal.  A 3 mm polyp was found in the sigmoid colon. The                            polyp was sessile. The polyp was removed with a                            cold biopsy forceps. Resection and retrieval were                            complete.                           Non-bleeding internal hemorrhoids were found during                            retroflexion. The hemorrhoids were small.                           The exam was otherwise without abnormality. Complications:            No immediate complications. Estimated Blood Loss:     Estimated blood loss was minimal. Impression:               - One 3 mm polyp in the sigmoid colon, removed with                            a cold biopsy forceps. Resected and retrieved.                           - Non-bleeding internal hemorrhoids.                           - The examination was otherwise normal. Recommendation:           - Patient has a contact number  available for                            emergencies. The signs and symptoms of potential                            delayed complications were discussed with the                            patient. Return to normal activities tomorrow.                            Written discharge instructions were provided to the                            patient.                           - Resume previous diet.                           - Continue present medications.                           -  Await pathology results.                           - Repeat colonoscopy in 5-10 years for surveillance                            based on pathology results. Mauri Pole, MD 02/26/2016 8:38:26 AM This report has been signed electronically.

## 2016-02-26 NOTE — Progress Notes (Signed)
Called to room to assist during endoscopic procedure.  Patient ID and intended procedure confirmed with present staff. Received instructions for my participation in the procedure from the performing physician.  

## 2016-02-26 NOTE — Patient Instructions (Signed)
YOU HAD AN ENDOSCOPIC PROCEDURE TODAY AT [facility]:   Refer to the procedure report that was given to you for any specific questions about what was found during the examination.  If the procedure report does not answer your questions, please call your gastroenterologist to clarify.  If you requested that your care partner not be given the details of your procedure findings, then the procedure report has been included in a sealed envelope for you to review at your convenience later.  YOU SHOULD EXPECT: Some feelings of bloating in the abdomen. Passage of more gas than usual.  Walking can help get rid of the air that was put into your GI tract during the procedure and reduce the bloating. If you had a lower endoscopy (such as a colonoscopy or flexible sigmoidoscopy) you may notice spotting of blood in your stool or on the toilet paper. If you underwent a bowel prep for your procedure, you may not have a normal bowel movement for a few days.  Please Note:  You might notice some irritation and congestion in your nose or some drainage.  This is from the oxygen used during your procedure.  There is no need for concern and it should clear up in a day or so.  SYMPTOMS TO REPORT IMMEDIATELY:   Following lower endoscopy (colonoscopy or flexible sigmoidoscopy):  Excessive amounts of blood in the stool  Significant tenderness or worsening of abdominal pains  Swelling of the abdomen that is new, acute  Fever of 100F or higher   Following upper endoscopy (EGD)  Vomiting of blood or coffee ground material  New chest pain or pain under the shoulder blades  Painful or persistently difficult swallowing  New shortness of breath  Fever of 100F or higher  Black, tarry-looking stools  For urgent or emergent issues, a gastroenterologist can be reached at any hour by calling [phone].   DIET:  We do recommend a small meal at first, but then you may proceed to your regular diet.  Drink  plenty of fluids but you should avoid alcoholic beverages for 24 hours.  ACTIVITY:  You should plan to take it easy for the rest of today and you should NOT DRIVE or use heavy machinery until tomorrow (because of the sedation medicines used during the test).    FOLLOW UP: Our staff will call the number listed on your records the next business day following your procedure to check on you and address any questions or concerns that you may have regarding the information given to you following your procedure. If we do not reach you, we will leave a message.  However, if you are feeling well and you are not experiencing any problems, there is no need to return our call.  We will assume that you have returned to your regular daily activities without incident.  If any biopsies were taken you will be contacted by phone or by letter within the next 1-3 weeks.  Please call us at [phone] if you have not heard about the biopsies in 3 weeks.    SIGNATURES/CONFIDENTIALITY: You and/or your care partner have signed paperwork which will be entered into your electronic medical record.  These signatures attest to the fact that that the information above on your After Visit Summary has been reviewed and is understood.  Full responsibility of the confidentiality of this discharge information lies with you and/or your care-partner.YOU HAD AN ENDOSCOPIC PROCEDURE TODAY AT [facility]:  Refer to the procedure report that was given to you for any specific questions about what was found during the examination.  If the procedure report does not answer your questions, please call your gastroenterologist to clarify.  If you requested that your care partner not be given the details of your procedure findings, then the procedure report has been included in a sealed envelope for you to review at your convenience later.  YOU SHOULD EXPECT: Some feelings of bloating in the abdomen. Passage of more gas than  usual.  Walking can help get rid of the air that was put into your GI tract during the procedure and reduce the bloating. If you had a lower endoscopy (such as a colonoscopy or flexible sigmoidoscopy) you may notice spotting of blood in your stool or on the toilet paper. If you underwent a bowel prep for your procedure, you may not have a normal bowel movement for a few days.  Please Note:  You might notice some irritation and congestion in your nose or some drainage.  This is from the oxygen used during your procedure.  There is no need for concern and it should clear up in a day or so.  SYMPTOMS TO REPORT IMMEDIATELY:   Following lower endoscopy (colonoscopy or flexible sigmoidoscopy):  Excessive amounts of blood in the stool  Significant tenderness or worsening of abdominal pains  Swelling of the abdomen that is new, acute  Fever of 100F or higher   For urgent or emergent issues, a gastroenterologist can be reached at any hour by calling [phone].   DIET:  We do recommend a small meal at first, but then you may proceed to your regular diet.  Drink plenty of fluids but you should avoid alcoholic beverages for 24 hours.  ACTIVITY:  You should plan to take it easy for the rest of today and you should NOT DRIVE or use heavy machinery until tomorrow (because of the sedation medicines used during the test).     Please read all handouts given to you by your recovery RN.   FOLLOW UP: Our staff will call the number listed on your records the next business day following your procedure to check on you and address any questions or concerns that you may have regarding the information given to you following your procedure. If we do not reach you, we will leave a message.  However, if you are feeling well and you are not experiencing any problems, there is no need to return our call.  We will assume that you have returned to your regular daily activities without incident.  If any biopsies were  taken you will be contacted by phone or by letter within the next 1-3 weeks.  Please call us at [phone] if you have not heard about the biopsies in 3 weeks.    SIGNATURES/CONFIDENTIALITY: You and/or your care partner have signed paperwork which will be entered into your electronic medical record.  These signatures attest to the fact that that the information above on your After Visit Summary has been reviewed and is understood.  Full responsibility of the confidentiality of this discharge information lies with you and/or your care-partner.  Thank you for letting us take care of your healthcare needs today.

## 2016-02-27 ENCOUNTER — Telehealth: Payer: Self-pay

## 2016-02-27 ENCOUNTER — Telehealth: Payer: Self-pay | Admitting: *Deleted

## 2016-02-27 NOTE — Telephone Encounter (Signed)
  Follow up Call-  Call back number 02/26/2016  Post procedure Call Back phone  # [phone]  Permission to leave phone message Yes     Patient questions:  Do you have a fever, pain , or abdominal swelling? No. Pain Score  0 *  Have you tolerated food without any problems? Yes.    Have you been able to return to your normal activities? Yes.    Do you have any questions about your discharge instructions: Diet   No. Medications  No. Follow up visit  No.  Do you have questions or concerns about your Care? No.  Actions: * If pain score is 4 or above: No action needed, pain <4.

## 2016-02-27 NOTE — Telephone Encounter (Signed)
   Follow up Call-  Call back number 02/26/2016  Post procedure Call Back phone  # [phone]  Permission to leave phone message Yes     Patient questions:  Message left to call us if necessary.

## 2016-03-04 ENCOUNTER — Encounter: Payer: Self-pay | Admitting: Gastroenterology

## 2016-03-11 ENCOUNTER — Ambulatory Visit: Payer: 59 | Admitting: Osteopathic Medicine

## 2016-03-12 ENCOUNTER — Ambulatory Visit (INDEPENDENT_AMBULATORY_CARE_PROVIDER_SITE_OTHER): Payer: 59 | Admitting: Osteopathic Medicine

## 2016-03-12 ENCOUNTER — Encounter: Payer: Self-pay | Admitting: Osteopathic Medicine

## 2016-03-12 ENCOUNTER — Other Ambulatory Visit (HOSPITAL_COMMUNITY)
Admission: RE | Admit: 2016-03-12 | Discharge: 2016-03-12 | Disposition: A | Discharge status: Home / Self Care | Payer: 59 | Source: Ambulatory Visit | Attending: Osteopathic Medicine | Admitting: Osteopathic Medicine

## 2016-03-12 VITALS — BP 118/62 | HR 85 | Ht 63.0 in | Wt 195.0 lb

## 2016-03-12 DIAGNOSIS — Z01419 Encounter for gynecological examination (general) (routine) without abnormal findings: Secondary | ICD-10-CM | POA: Insufficient documentation

## 2016-03-12 DIAGNOSIS — R7301 Impaired fasting glucose: Secondary | ICD-10-CM

## 2016-03-12 DIAGNOSIS — Z1151 Encounter for screening for human papillomavirus (HPV): Secondary | ICD-10-CM | POA: Diagnosis not present

## 2016-03-12 DIAGNOSIS — Z0000 Encounter for general adult medical examination without abnormal findings: Secondary | ICD-10-CM | POA: Diagnosis not present

## 2016-03-12 DIAGNOSIS — F329 Major depressive disorder, single episode, unspecified: Secondary | ICD-10-CM

## 2016-03-12 DIAGNOSIS — F32A Depression, unspecified: Secondary | ICD-10-CM

## 2016-03-12 DIAGNOSIS — I10 Essential (primary) hypertension: Secondary | ICD-10-CM | POA: Diagnosis not present

## 2016-03-12 LAB — HEMOGLOBIN A1C
HEMOGLOBIN A1C: 5.7 % — AB (ref <5.7)
Mean Plasma Glucose: 117 mg/dL

## 2016-03-12 MED ORDER — VILAZODONE HCL 40 MG PO TABS: 40.0000 mg | ORAL_TABLET | Freq: Every day | ORAL | 3 refills | Status: DC

## 2016-03-12 NOTE — Progress Notes (Signed)
HPI: Nicole Osborne is a 55 y.o. female  who presents to [facility] today, 03/12/16,  for chief complaint of:  Chief Complaint  Patient presents with  . Gynecologic Exam    See preventive care reviewed as below. Patient has no complaints today. Doing well on current medications, requests refill. Notices when she misses a dose of antidepressant.  Past medical, surgical, social and family history reviewed: Patient Active Problem List   Diagnosis Date Noted  . Chest pain 01/22/2016  . Hyperglycemia 07/23/2014  . Elevated LFTs 04/25/2014  . History of colonoscopy 03/28/2014  . Hypertension 03/02/2014  . Hyperlipidemia 03/02/2014  . Depression 03/02/2014   Past Surgical History:  Procedure Laterality Date  . BREAST BIOPSY Right   . CESAREAN SECTION     x 3  . DILATION AND CURETTAGE OF UTERUS    . TUBAL LIGATION     Social History  Substance Use Topics  . Smoking status: Never Smoker  . Smokeless tobacco: Never Used  . Alcohol use Yes     Comment: occasional   Family History  Problem Relation Age of Onset  . Hypertension Mother   . Diabetes Mother   . Lung cancer Father   . Breast cancer Sister   . Leukemia Sister   . Colon cancer Maternal Grandmother   . Lung cancer Sister   . CAD Neg Hx      Current medication list and allergy/intolerance information reviewed:   Current Outpatient Prescriptions  Medication Sig Dispense Refill  . lisinopril-hydrochlorothiazide (PRINZIDE,ZESTORETIC) 20-25 MG tablet Take 0.5 tablets by mouth daily. 45 tablet 3  . Vilazodone HCl (VIIBRYD) 40 MG TABS Take 1 tablet (40 mg total) by mouth daily. 90 tablet 3   Current Facility-Administered Medications  Medication Dose Route Frequency Provider Last Rate Last Dose  . 0.9 %  sodium chloride infusion  500 mL Intravenous Continuous Mauri Pole, MD       No Known Allergies    Review of Systems:  Constitutional:  No  fever, no chills, No  recent illness  HEENT: No  headache, no vision change  Cardiac: No  chest pain, No  pressure  Respiratory:  No  shortness of breath. No  Cough  Gastrointestinal: No  abdominal pain, No  nausea   Musculoskeletal: No new myalgia/arthralgia  Genitourinary:No abnormal genital discharge  Skin: No  Rash  Neurologic: No  weakness, No  dizziness  Psychiatric: No  concerns with depression, No  concerns with anxiety, No sleep problems, No mood problems  Exam:  BP 118/62   Pulse 85   Ht 5\' 3"  (1.6 m)   Wt 195 lb (88.5 kg)   BMI 34.54 kg/m   Constitutional: VS see above. General Appearance: alert, well-developed, well-nourished, NAD  Eyes: Normal lids and conjunctive, non-icteric sclera  Ears, Nose, Mouth, Throat: MMM, Normal external inspection ears/nares/mouth/lips/gums. TM normal bilaterally. Pharynx/tonsils no erythema, no exudate. Nasal mucosa normal.   Neck: No masses, trachea midline. No thyroid enlargement. No tenderness/mass appreciated. No lymphadenopathy  Respiratory: Normal respiratory effort. no wheeze, no rhonchi, no rales  Cardiovascular: S1/S2 normal, no murmur, no rub/gallop auscultated. RRR. No lower extremity edema.   Gastrointestinal: Nontender, no masses.   Musculoskeletal: Gait normal. No clubbing/cyanosis of digits.   Neurological: Normal balance/coordination. No tremor.   Skin: warm, dry, intact. No rash/ulcer. No concerning nevi or subq nodules on limited exam.    Psychiatric: Normal judgment/insight. Normal mood and affect. Oriented x3.  GYN:  No lesions/ulcers to external genitalia, normal urethra, normal vaginal mucosa, physiologic discharge, cervix normal with small cervical polyp, uterus not enlarged or tender, adnexa no masses and nontender     ASSESSMENT/PLAN: Okay to refill medications through 02/2017. Follow-up as needed/when due for physical in 1 year.  Annual physical exam - Plan: Cytology - PAP, MM DIGITAL SCREENING BILATERAL, HIV  antibody, Hepatitis C antibody  Depression, unspecified depression type - Plan: Vilazodone HCl (VIIBRYD) 40 MG TABS  Essential hypertension  Elevated fasting glucose - Plan: Hemoglobin A1c     FEMALE PREVENTIVE CARE Updated 03/12/16   ANNUAL SCREENING/COUNSELING  Diet/Exercise - HEALTHY HABITS DISCUSSED TO DECREASE CV RISK History  Smoking Status  . Never Smoker  Smokeless Tobacco  . Never Used   History  Alcohol Use  . Yes    Comment: occasional   Depression screen PHQ 2/9 03/12/2016  Decreased Interest 0  Down, Depressed, Hopeless 0  PHQ - 2 Score 0    Domestic violence concerns - no  HTN SCREENING - SEE [place]  Sexually active in the past year - Yes with female.  Need/want STI testing today? - no  Concerns about libido or pain with sex? - some pain   Plans for pregnancy? - postmenopausal  INFECTIOUS DISEASE SCREENING  HIV - needs  GC/CT - does not need  HepC - DOB 1946-1965 - needs  TB - does not need  DISEASE SCREENING  Lipid - does not need  DM2 - does not need  Osteoporosis - women age 37+ - does not need  CANCER SCREENING  Cervical - needs  Breast - needs  Lung - does not need  Colon - does not need  ADULT VACCINATION  Influenza - annual vaccine recommended  Td - booster every 10 years   Zoster - option at 34, yes at 60+   PCV13 - was not indicated  PPSV23 - was not indicated Immunization History  Administered Date(s) Administered  . Influenza-Unspecified 12/29/2014, 12/29/2015     Visit summary with medication list and pertinent instructions was printed for patient to review. All questions at time of visit were answered - patient instructed to contact office with any additional concerns. ER/RTC precautions were reviewed with the patient. Follow-up plan: Return in about 1 year (around 03/12/2017) for [place], sooner if needed for follow-up on depression medication or other concerns.

## 2016-03-13 LAB — HIV ANTIBODY (ROUTINE TESTING W REFLEX): HIV: NONREACTIVE

## 2016-03-13 LAB — HEPATITIS C ANTIBODY: HCV AB: NEGATIVE

## 2016-03-16 LAB — CYTOLOGY - PAP
Diagnosis: NEGATIVE
HPV: NOT DETECTED

## 2016-03-25 ENCOUNTER — Ambulatory Visit (INDEPENDENT_AMBULATORY_CARE_PROVIDER_SITE_OTHER): Payer: 59

## 2016-03-25 DIAGNOSIS — Z0000 Encounter for general adult medical examination without abnormal findings: Secondary | ICD-10-CM

## 2016-03-25 DIAGNOSIS — Z1231 Encounter for screening mammogram for malignant neoplasm of breast: Secondary | ICD-10-CM

## 2016-04-14 ENCOUNTER — Telehealth: Payer: Self-pay

## 2016-04-14 NOTE — Telephone Encounter (Signed)
Patient called stated that she is still having RUOQ pain and she is requesting an order for a  scan of her Gallbladder. Please advise. Kalden Wanke,CMA

## 2016-04-14 NOTE — Telephone Encounter (Signed)
I prefer that she schedule a follow-up in the office to discuss this further and have physical exam performed prior to ordering ultrasound for gallbladder.    I don't have anything documented about previous discussion of this with the patient, she may have mentioned it to me briefly at her last annual visit but nothing mentioned in diagnosis/plan and I believe her exam at that point was normal.

## 2016-04-14 NOTE — Telephone Encounter (Signed)
Left detailed message on patient vm with advise as noted below. Advised patient to schedule an appointment. Rhianon Zabawa,CMA

## 2016-04-17 ENCOUNTER — Encounter: Payer: Self-pay | Admitting: Cardiology

## 2016-04-17 ENCOUNTER — Ambulatory Visit (INDEPENDENT_AMBULATORY_CARE_PROVIDER_SITE_OTHER): Payer: 59 | Admitting: Cardiology

## 2016-04-17 ENCOUNTER — Ambulatory Visit (INDEPENDENT_AMBULATORY_CARE_PROVIDER_SITE_OTHER): Payer: 59

## 2016-04-17 VITALS — BP 120/74 | HR 61 | Ht 64.0 in | Wt 188.8 lb

## 2016-04-17 DIAGNOSIS — I10 Essential (primary) hypertension: Secondary | ICD-10-CM | POA: Diagnosis not present

## 2016-04-17 DIAGNOSIS — R079 Chest pain, unspecified: Secondary | ICD-10-CM

## 2016-04-17 LAB — EXERCISE TOLERANCE TEST
CHL CUP RESTING HR STRESS: 63 {beats}/min
CHL RATE OF PERCEIVED EXERTION: 16
CSEPEW: 10.4 METS
CSEPHR: 101 %
Exercise duration (min): 9 min
Exercise duration (sec): 0 s
MPHR: 164 {beats}/min
Peak HR: 166 {beats}/min

## 2016-04-17 NOTE — Progress Notes (Signed)
Cardiology Office Note    Date:  04/17/2016   ID:  Nicole Osborne, DOB [birth year], MRN [id number]  PCP:  Emeterio Reeve, DO  Cardiologist:  Fransico Him, MD   Chief Complaint  Patient presents with  . New Evaluation  . Chest Pain    History of Present Illness:  Nicole Osborne is a 56 y.o. female with a history of HTN, hyperlipidemia and gallbladder sludge who presents today for evaluation of CP.  She has had 3 bouts of chest pain in the past few months and then went to the ER on 01/22/2016.  She says that she was diagnosed with dehydration.  Cardiac enzymes were negative and EKG showed NSR with nonspecific ST abnormality and echo showed low normal LVF with trivial MR and TR. She described the pain as a heaviness over the left chest with no radiation.  Nothing made it better or worse.  It usually would last a few minutes at a time.  She got dizzy with it but no nausea or diaphoresis.  She has not had any problems since then.  She has been working out at [employer] with the treadmill and bike and does fine with no CP.  She denies any DOE, LE edema, palpitations or syncope.     Past Medical History:  Diagnosis Date  . Anemia   . Colon polyp   . Depression   . Diverticulosis   . Gallbladder sludge   . Hypertension   . Hypertriglyceridemia     Past Surgical History:  Procedure Laterality Date  . BREAST BIOPSY Right   . CESAREAN SECTION     x 3  . DILATION AND CURETTAGE OF UTERUS    . TUBAL LIGATION      Current Medications: Outpatient Medications Prior to Visit  Medication Sig Dispense Refill  . lisinopril-hydrochlorothiazide (PRINZIDE,ZESTORETIC) 20-25 MG tablet Take 0.5 tablets by mouth daily. 45 tablet 3  . Vilazodone HCl (VIIBRYD) 40 MG TABS Take 1 tablet (40 mg total) by mouth daily. 90 tablet 3  . 0.9 %  sodium chloride infusion      No facility-administered medications prior to visit.      Allergies:   Patient has no known allergies.   Social  History   Social History  . Marital status: Married    Spouse name: N/A  . Number of children: 3  . Years of education: N/A   Occupational History  . RN    Social History Main Topics  . Smoking status: Never Smoker  . Smokeless tobacco: Never Used  . Alcohol use Yes     Comment: occasional  . Drug use: No  . Sexual activity: Not Asked   Other Topics Concern  . None   Social History Narrative  . None     Family History:  The patient's family history includes Breast cancer in her sister; Colon cancer in her maternal grandmother; Diabetes in her mother; Hypertension in her mother; Leukemia in her sister; Lung cancer in her father and sister.   ROS:   Please see the history of present illness.    ROS All other systems reviewed and are negative.  No flowsheet data found.     PHYSICAL EXAM:   VS:  BP 120/74   Pulse 61   Ht 5\' 4"  (1.626 m)   Wt 188 lb 12.8 oz (85.6 kg)   BMI 32.41 kg/m    GEN: Well nourished, well developed, in no acute distress  HEENT: normal  Neck: no JVD, carotid bruits, or masses Cardiac: RRR; no murmurs, rubs, or gallops,no edema.  Intact distal pulses bilaterally.  Respiratory:  clear to auscultation bilaterally, normal work of breathing GI: soft, nontender, nondistended, + BS MS: no deformity or atrophy  Skin: warm and dry, no rash Neuro:  Alert and Oriented x 3, Strength and sensation are intact Psych: euthymic mood, full affect  Wt Readings from Last 3 Encounters:  04/17/16 188 lb 12.8 oz (85.6 kg)  03/12/16 195 lb (88.5 kg)  02/26/16 191 lb (86.6 kg)      Studies/Labs Reviewed:   EKG:  EKG is ordered today.  The ekg ordered today demonstrates NSR with no ST changes  Recent Labs: 01/27/2016: ALT 44; BUN 11; Creat 0.81; Hemoglobin 14.4; Platelets 240; Potassium 4.5; Sodium 139; TSH 1.54   Lipid Panel    Component Value Date/Time   CHOL 214 (H) 01/27/2016 0832   TRIG 212 (H) 01/27/2016 0832   HDL 44 (L) 01/27/2016 0832    CHOLHDL 4.9 01/27/2016 0832   VLDL 42 (H) 01/27/2016 0832   LDLCALC 128 01/27/2016 0832    Additional studies/ records that were reviewed today include:  Hospital records from admission    ASSESSMENT:    1. Chest pain, unspecified type   2. Essential hypertension      PLAN:  In order of problems listed above:  1. Chest pain is atypical and suspect is GI related.  She is able to exercise at the gym without any problems.  She has not had any further CP since last fall.  EKG is normal and echo showed low normal LVF.  I will get a baseline ETT to assess for ischemia. 2. HTN - BP controlled on current meds.  Continue diuretic and ACE I.    Medication Adjustments/Labs and Tests Ordered: Current medicines are reviewed at length with the patient today.  Concerns regarding medicines are outlined above.  Medication changes, Labs and Tests ordered today are listed in the Patient Instructions below.  There are no Patient Instructions on file for this visit.   Signed, Fransico Him, MD  04/17/2016 10:35 AM    [address], [place], [place]  [address] Phone: [phone]; Fax: [phone]

## 2016-04-17 NOTE — Addendum Note (Signed)
Addended by: Zebedee Iba on: 04/17/2016 02:14 PM   Modules accepted: Orders

## 2016-04-17 NOTE — Patient Instructions (Signed)
Medication Instructions:  Your physician recommends that you continue on your current medications as directed. Please refer to the Current Medication list given to you today.   Labwork: None  Testing/Procedures: Your physician has requested that you have an exercise tolerance test. For further information please visit [email or web address]. Please also follow instruction sheet, as given.  Follow-Up: Your physician recommends that you schedule a follow-up appointment AS NEEDED with Dr. Turner pending study results.  Any Other Special Instructions Will Be Listed Below (If Applicable).     If you need a refill on your cardiac medications before your next appointment, please call your pharmacy.   

## 2016-04-23 ENCOUNTER — Ambulatory Visit: Payer: 59 | Admitting: Osteopathic Medicine

## 2016-04-30 MED FILL — VIIBRYD 40 MG TABLET: 40 | 90 days supply | Qty: 90 | Fill #0

## 2016-04-30 MED FILL — LISINOPRIL-HCTZ 20-25 MG TA: 20-25 | 90 days supply | Qty: 45 | Fill #0

## 2016-08-11 MED FILL — LISINOPRIL-HCTZ 20-25 MG TA: 20-25 | 90 days supply | Qty: 45 | Fill #1

## 2016-08-11 MED FILL — VIIBRYD 40 MG TABLET: 40 | 90 days supply | Qty: 90 | Fill #1

## 2016-09-08 ENCOUNTER — Encounter: Payer: Self-pay | Admitting: Osteopathic Medicine

## 2016-09-08 ENCOUNTER — Ambulatory Visit (INDEPENDENT_AMBULATORY_CARE_PROVIDER_SITE_OTHER): Payer: 59 | Admitting: Osteopathic Medicine

## 2016-09-08 VITALS — BP 120/67 | HR 87 | Ht 64.0 in | Wt 193.0 lb

## 2016-09-08 DIAGNOSIS — R109 Unspecified abdominal pain: Secondary | ICD-10-CM | POA: Diagnosis not present

## 2016-09-08 DIAGNOSIS — R300 Dysuria: Secondary | ICD-10-CM | POA: Diagnosis not present

## 2016-09-08 DIAGNOSIS — R1011 Right upper quadrant pain: Secondary | ICD-10-CM

## 2016-09-08 DIAGNOSIS — R319 Hematuria, unspecified: Secondary | ICD-10-CM | POA: Diagnosis not present

## 2016-09-08 LAB — COMPLETE METABOLIC PANEL WITH GFR
ALT: 33 U/L — AB (ref 6–29)
AST: 36 U/L — AB (ref 10–35)
Albumin: 3.8 g/dL (ref 3.6–5.1)
Alkaline Phosphatase: 77 U/L (ref 33–130)
BUN: 9 mg/dL (ref 7–25)
CHLORIDE: 103 mmol/L (ref 98–110)
CO2: 29 mmol/L (ref 20–31)
CREATININE: 0.78 mg/dL (ref 0.50–1.05)
Calcium: 9.6 mg/dL (ref 8.6–10.4)
GFR, Est African American: >89 mL/min (ref >=60)
GFR, Est Non African American: 85 mL/min (ref >=60)
Glucose, Bld: 107 mg/dL — ABNORMAL HIGH (ref 65–99)
Potassium: 4.4 mmol/L (ref 3.5–5.3)
Sodium: 140 mmol/L (ref 135–146)
Total Bilirubin: 0.9 mg/dL (ref 0.2–1.2)
Total Protein: 6.6 g/dL (ref 6.1–8.1)

## 2016-09-08 LAB — CBC WITH DIFFERENTIAL/PLATELET
Basophils Absolute: 0 cells/uL (ref 0–200)
Basophils Relative: 0 %
EOS ABS: 243 {cells}/uL (ref 15–500)
EOS PCT: 3 %
HCT: 41.7 % (ref 35.0–45.0)
HEMOGLOBIN: 14.2 g/dL (ref 11.7–15.5)
LYMPHS ABS: 2754 {cells}/uL (ref 850–3900)
Lymphocytes Relative: 34 %
MCH: 29.1 pg (ref 27.0–33.0)
MCHC: 34.1 g/dL (ref 32.0–36.0)
MCV: 85.5 fL (ref 80.0–100.0)
MPV: 10.4 fL (ref 7.5–12.5)
Monocytes Absolute: 567 cells/uL (ref 200–950)
Monocytes Relative: 7 %
NEUTROS ABS: 4536 {cells}/uL (ref 1500–7800)
NEUTROS PCT: 56 %
PLATELETS: 237 10*3/uL (ref 140–400)
RBC: 4.88 MIL/uL (ref 3.80–5.10)
RDW: 13.5 % (ref 11.0–15.0)
WBC: 8.1 10*3/uL (ref 3.8–10.8)

## 2016-09-08 LAB — POCT URINALYSIS DIPSTICK
BILIRUBIN UA: NEGATIVE
Glucose, UA: NEGATIVE
KETONES UA: NEGATIVE
Nitrite, UA: NEGATIVE
PH UA: 5.5 (ref 5.0–8.0)
PROTEIN UA: NEGATIVE
SPEC GRAV UA: 1.02 (ref 1.010–1.025)
Urobilinogen, UA: 0.2 E.U./dL

## 2016-09-08 NOTE — Progress Notes (Signed)
HPI: Nicole Osborne is a 56 y.o. female  who presents to [facility] today, 09/08/16,  for chief complaint of:  Chief Complaint  Patient presents with  . Abdominal Pain   Abd pain RUQ . Context: concerned given hx diverticulosis  . Location: R upper/mid-abdomen, nonradiating  . Quality: cramping type pain  . Severity: still there but not as bad as it was.  . Duration: started 2 days ago,  . Timing: hurts worse with deep breath, yawn  . Assoc signs/symptoms: no fever/chills    Past medical, surgical, social and family history reviewed: Patient Active Problem List   Diagnosis Date Noted  . Chest pain 01/22/2016  . Hyperglycemia 07/23/2014  . Elevated LFTs 04/25/2014  . History of colonoscopy 03/28/2014  . Hypertension 03/02/2014  . Hyperlipidemia 03/02/2014  . Depression 03/02/2014   Past Surgical History:  Procedure Laterality Date  . BREAST BIOPSY Right   . CESAREAN SECTION     x 3  . DILATION AND CURETTAGE OF UTERUS    . TUBAL LIGATION     Social History   Social History  . Marital status: Married    Spouse name: N/A  . Number of children: 3  . Years of education: N/A   Occupational History  . RN    Social History Main Topics  . Smoking status: Never Smoker  . Smokeless tobacco: Never Used  . Alcohol use Yes     Comment: occasional  . Drug use: No  . Sexual activity: Not on file   Other Topics Concern  . Not on file   Social History Narrative  . No narrative on file   Family History  Problem Relation Age of Onset  . Hypertension Mother   . Diabetes Mother   . Lung cancer Father   . Breast cancer Sister   . Leukemia Sister   . Colon cancer Maternal Grandmother   . Lung cancer Sister   . CAD Neg Hx       Current medication list and allergy/intolerance information reviewed:   Outpatient Encounter Prescriptions as of 09/08/2016  Medication Sig  . lisinopril-hydrochlorothiazide (PRINZIDE,ZESTORETIC)  20-25 MG tablet Take 0.5 tablets by mouth daily.  . Vilazodone HCl (VIIBRYD) 40 MG TABS Take 1 tablet (40 mg total) by mouth daily.   No facility-administered encounter medications on file as of 09/08/2016.    No Known Allergies     Review of Systems:  Constitutional:  No  fever, no chills, No recent illness, No unintentional weight changes. No significant fatigue.   HEENT: No  headache, no vision change  Cardiac: No  chest pain, No  pressure  Respiratory:  No  shortness of breath. No  Cough  Gastrointestinal: No  abdominal pain, No  nausea, No  vomiting,  No  blood in stool, No  diarrhea, No  Constipation, low appetite   Musculoskeletal: No new myalgia/arthralgia  Genitourinary: No  abnormal genital bleeding, No abnormal genital discharge  Neurologic: No  weakness, No  dizziness  Exam:  BP 120/67   Pulse 87   Ht 5\' 4"  (1.626 m)   Wt 193 lb (87.5 kg)   BMI 33.13 kg/m   Constitutional: VS see above. General Appearance: alert, well-developed, well-nourished, NAD  Eyes: Normal lids and conjunctive, non-icteric sclera  Ears, Nose, Mouth, Throat: MMM, Normal external inspection ears/nares/mouth/lips/gums.  Neck: No masses, trachea midline. No thyroid enlargement. No tenderness/mass appreciated. No lymphadenopathy  Respiratory: Normal respiratory effort. no wheeze, no rhonchi,  no rales  Cardiovascular: S1/S2 normal, no murmur, no rub/gallop auscultated. RRR. No lower extremity edema.   Gastrointestinal: (+)TTP RUQ but no reboung/guarding, neg murphys, no masses. No hepatomegaly, no splenomegaly. No hernia appreciated. Bowel sounds normal. Rectal exam deferred.   Musculoskeletal: Gait normal. No clubbing/cyanosis of digits.   Neurological: Normal balance/coordination. No tremor.    Skin: warm, dry, intact   Psychiatric: Normal judgment/insight. Normal mood and affect. Oriented x3.    Results for orders placed or performed in visit on 09/08/16 (from the past 72  hour(s))  POCT Urinalysis Dipstick     Status: Abnormal   Collection Time: 09/08/16  8:43 AM  Result Value Ref Range   Color, UA YELLOW    Clarity, UA CLEAR    Glucose, UA NEGATIVE    Bilirubin, UA NEGATIVE    Ketones, UA NEGATIVE    Spec Grav, UA 1.020 1.010 - 1.025   Blood, UA MODERATE    pH, UA 5.5 5.0 - 8.0   Protein, UA NEGATIVE    Urobilinogen, UA 0.2 0.2 or 1.0 E.U./dL   Nitrite, UA NEGATIVE    Leukocytes, UA Small (1+) (A) Negative    No results found.    ASSESSMENT/PLAN: Concern at this point for hepatic/GB etiology, will get lipase as well, encouraged that pt is overall feeling better, possible biliary colic or other functional disorder which may require specialist f/u if labs/imaging neg and persistent symptoms   Right upper quadrant abdominal pain - Plan: POCT Urinalysis Dipstick, CBC with Differential/Platelet, COMPLETE METABOLIC PANEL WITH GFR, Lipase, US Abdomen Limited RUQ  Hematuria, unspecified type - Plan: Urine Culture, Urine Microscopic    Visit summary with medication list and pertinent instructions was printed for patient to review. All questions at time of visit were answered - patient instructed to contact office with any additional concerns. ER/RTC precautions were reviewed with the patient. Follow-up plan: Return if symptoms worsen or fail to improve in one week, sooner if worse/change.

## 2016-09-08 NOTE — Patient Instructions (Addendum)

## 2016-09-09 ENCOUNTER — Ambulatory Visit (INDEPENDENT_AMBULATORY_CARE_PROVIDER_SITE_OTHER): Payer: 59

## 2016-09-09 DIAGNOSIS — R1011 Right upper quadrant pain: Secondary | ICD-10-CM | POA: Diagnosis not present

## 2016-09-09 DIAGNOSIS — K760 Fatty (change of) liver, not elsewhere classified: Secondary | ICD-10-CM

## 2016-09-09 LAB — URINE CULTURE: Organism ID, Bacteria: NO GROWTH

## 2016-09-09 LAB — URINALYSIS, MICROSCOPIC ONLY
CASTS: NONE SEEN [LPF]
CRYSTALS: NONE SEEN [HPF]
YEAST: NONE SEEN [HPF]

## 2016-09-09 LAB — LIPASE: Lipase: 47 U/L (ref 7–60)

## 2016-11-12 MED FILL — LISINOPRIL-HCTZ 20-25 MG TA: 20-25 | 90 days supply | Qty: 45 | Fill #2

## 2017-02-22 ENCOUNTER — Other Ambulatory Visit: Payer: Self-pay | Admitting: Osteopathic Medicine

## 2017-02-22 DIAGNOSIS — I10 Essential (primary) hypertension: Secondary | ICD-10-CM

## 2017-02-22 MED FILL — LISINOPRIL-HCTZ 20-25 MG TA: 20-25 | 90 days supply | Qty: 45 | Fill #0

## 2017-04-17 ENCOUNTER — Other Ambulatory Visit: Payer: Self-pay

## 2017-04-17 ENCOUNTER — Emergency Department (HOSPITAL_COMMUNITY)
Admission: EM | Admit: 2017-04-17 | Discharge: 2017-04-17 | Disposition: A | Discharge status: Home / Self Care | Payer: 59 | Attending: Emergency Medicine | Admitting: Emergency Medicine

## 2017-04-17 ENCOUNTER — Encounter (HOSPITAL_COMMUNITY): Payer: Self-pay | Admitting: *Deleted

## 2017-04-17 ENCOUNTER — Emergency Department (HOSPITAL_COMMUNITY): Payer: 59

## 2017-04-17 DIAGNOSIS — S43402A Unspecified sprain of left shoulder joint, initial encounter: Secondary | ICD-10-CM | POA: Diagnosis not present

## 2017-04-17 DIAGNOSIS — S53402A Unspecified sprain of left elbow, initial encounter: Secondary | ICD-10-CM | POA: Insufficient documentation

## 2017-04-17 DIAGNOSIS — Y92511 Restaurant or cafe as the place of occurrence of the external cause: Secondary | ICD-10-CM | POA: Diagnosis not present

## 2017-04-17 DIAGNOSIS — Y9389 Activity, other specified: Secondary | ICD-10-CM | POA: Insufficient documentation

## 2017-04-17 DIAGNOSIS — M79602 Pain in left arm: Secondary | ICD-10-CM

## 2017-04-17 DIAGNOSIS — Y999 Unspecified external cause status: Secondary | ICD-10-CM | POA: Insufficient documentation

## 2017-04-17 DIAGNOSIS — Z79899 Other long term (current) drug therapy: Secondary | ICD-10-CM | POA: Insufficient documentation

## 2017-04-17 DIAGNOSIS — S63502A Unspecified sprain of left wrist, initial encounter: Secondary | ICD-10-CM | POA: Diagnosis not present

## 2017-04-17 DIAGNOSIS — M79632 Pain in left forearm: Secondary | ICD-10-CM | POA: Diagnosis not present

## 2017-04-17 DIAGNOSIS — I10 Essential (primary) hypertension: Secondary | ICD-10-CM | POA: Insufficient documentation

## 2017-04-17 DIAGNOSIS — W010XXA Fall on same level from slipping, tripping and stumbling without subsequent striking against object, initial encounter: Secondary | ICD-10-CM | POA: Diagnosis not present

## 2017-04-17 DIAGNOSIS — S6392XA Sprain of unspecified part of left wrist and hand, initial encounter: Secondary | ICD-10-CM | POA: Diagnosis not present

## 2017-04-17 DIAGNOSIS — M25522 Pain in left elbow: Secondary | ICD-10-CM | POA: Diagnosis not present

## 2017-04-17 DIAGNOSIS — S59902A Unspecified injury of left elbow, initial encounter: Secondary | ICD-10-CM | POA: Diagnosis not present

## 2017-04-17 DIAGNOSIS — S4982XA Other specified injuries of left shoulder and upper arm, initial encounter: Secondary | ICD-10-CM | POA: Diagnosis present

## 2017-04-17 DIAGNOSIS — S6992XA Unspecified injury of left wrist, hand and finger(s), initial encounter: Secondary | ICD-10-CM | POA: Diagnosis not present

## 2017-04-17 DIAGNOSIS — M7989 Other specified soft tissue disorders: Secondary | ICD-10-CM | POA: Diagnosis not present

## 2017-04-17 DIAGNOSIS — S59912A Unspecified injury of left forearm, initial encounter: Secondary | ICD-10-CM | POA: Diagnosis not present

## 2017-04-17 MED ORDER — IBUPROFEN 400 MG PO TABS
600.0000 mg | ORAL_TABLET | Freq: Once | ORAL | Status: AC
  Administered 2017-04-17: 600 mg via ORAL
  Filled 2017-04-17: qty 1

## 2017-04-17 NOTE — ED Triage Notes (Signed)
Pt missing a step coming out of [employer] tonight, falling onto L arm. Tender to touch, difficulty moving arm. Did not hit head, denies LOC

## 2017-04-17 NOTE — ED Provider Notes (Signed)
[facility] EMERGENCY DEPARTMENT Provider Note   CSN: [id number] Arrival date & time: 04/17/17  1958     History   Chief Complaint Chief Complaint  Patient presents with  . Arm Pain    HPI Nicole Osborne is a 57 y.o. female who presents for evaluation of left upper extremity pain that began after mechanical fall just prior to arrival.  Patient reports that she was walking out of a restaurant when she tripped on the step, causing her to fall.  Patient states that her arm was folded against her body.  She did not have her arm outstretched.  She denies any preceding chest pain or dizziness.  Patient states that she did not her head or lose consciousness.  Patient reports that since then, she has experienced pain from her mid left forearm up to her upper arm.  Pain is worsened with movement of the arm.  Patient has not taking medications for the symptoms.  Patient denies any numbness/weakness.  The history is provided by the patient.    Past Medical History:  Diagnosis Date  . Anemia   . Colon polyp   . Depression   . Diverticulosis   . Gallbladder sludge   . Hypertension   . Hypertriglyceridemia     Patient Active Problem List   Diagnosis Date Noted  . Chest pain 01/22/2016  . Hyperglycemia 07/23/2014  . Elevated LFTs 04/25/2014  . History of colonoscopy 03/28/2014  . Hypertension 03/02/2014  . Hyperlipidemia 03/02/2014  . Depression 03/02/2014    Past Surgical History:  Procedure Laterality Date  . BREAST BIOPSY Right   . CESAREAN SECTION     x 3  . DILATION AND CURETTAGE OF UTERUS    . TUBAL LIGATION      OB History    No data available       Home Medications    Prior to Admission medications   Medication Sig Start Date End Date Taking? Authorizing Provider  lisinopril-hydrochlorothiazide (PRINZIDE,ZESTORETIC) 20-25 MG tablet TAKE ONE-HALF TABLET BY MOUTH DAILY. 02/22/17   Emeterio Reeve, DO  Vilazodone HCl (VIIBRYD) 40 MG TABS  Take 1 tablet (40 mg total) by mouth daily. 03/12/16   Emeterio Reeve, DO    Family History Family History  Problem Relation Age of Onset  . Hypertension Mother   . Diabetes Mother   . Lung cancer Father   . Breast cancer Sister   . Leukemia Sister   . Colon cancer Maternal Grandmother   . Lung cancer Sister   . CAD Neg Hx     Social History Social History   Tobacco Use  . Smoking status: Never Smoker  . Smokeless tobacco: Never Used  Substance Use Topics  . Alcohol use: Yes    Comment: occasional  . Drug use: No     Allergies   Patient has no known allergies.   Review of Systems Review of Systems  Cardiovascular: Negative for chest pain.  Musculoskeletal:       LUE pain  Neurological: Negative for dizziness, weakness and numbness.     Physical Exam Updated Vital Signs BP (!) 141/87   Pulse (!) 108   Temp 99.2 F (37.3 C) (Oral)   Resp 18   SpO2 99%   Physical Exam  Constitutional: She appears well-developed and well-nourished.  HENT:  Head: Normocephalic and atraumatic.  Eyes: Conjunctivae and EOM are normal. Right eye exhibits no discharge. Left eye exhibits no discharge. No scleral icterus.  Cardiovascular:  Pulses:      Radial pulses are 2+ on the right side, and 2+ on the left side.  Pulmonary/Chest: Effort normal.  Musculoskeletal:  Tenderness palpation to the mid left forearm with some overlying soft tissue swelling that extends up the forearm, to the elbow and up to the mid upper humerus.  No deformity or crepitus noted.  Flexion/extension of left elbow intact but with some subjective reports of pain.  Good range of motion of left shoulder without any difficulty.  Mild tenderness palpation to the tendon to the mid forearm with some overlying. Flexion/extension of left wrist intact without any difficulty.  No snuffbox tenderness.  No abnormalities of the right upper extremity.  Neurological: She is alert.  Sensation intact along major nerve  distributions of BUE  Skin: Skin is warm and dry. Capillary refill takes less than 2 seconds.  Good distal cap refill. LUE is not dusky in appearance or cool to touch.  Psychiatric: She has a normal mood and affect. Her speech is normal and behavior is normal.  Nursing note and vitals reviewed.    ED Treatments / Results  Labs (all labs ordered are listed, but only abnormal results are displayed) Labs Reviewed - No data to display  EKG  EKG Interpretation None       Radiology Dg Elbow Complete Left  Result Date: 04/17/2017 CLINICAL DATA:  Fall, arm pain EXAM: LEFT ELBOW - COMPLETE 3+ VIEW COMPARISON:  None. FINDINGS: No evidence of fracture of the ulna or humerus. The radial head is normal. No joint effusion. IMPRESSION: No fracture or dislocation. Electronically Signed   By: Suzy Bouchard M.D.   On: 04/17/2017 20:54   Dg Forearm Left  Result Date: 04/17/2017 CLINICAL DATA:  Fall.  Arm pain EXAM: LEFT FOREARM - 2 VIEW COMPARISON:  None. FINDINGS: No fracture of the radius or ulna. The elbow joint and the wrist joint appear normal on two views. IMPRESSION: No fracture or dislocation. Electronically Signed   By: Suzy Bouchard M.D.   On: 04/17/2017 20:51   Dg Wrist Complete Left  Result Date: 04/17/2017 CLINICAL DATA:  Fall. EXAM: LEFT WRIST - COMPLETE 3+ VIEW COMPARISON:  None FINDINGS: There is no evidence of fracture or dislocation. Degenerative changes are identified at the basilar joint. Dorsal soft tissue swelling noted. IMPRESSION: 1. No acute bone abnormality. 2. Dorsal soft tissue swelling. Electronically Signed   By: Kerby Moors M.D.   On: 04/17/2017 20:54    Procedures Procedures (including critical care time)  Medications Ordered in ED Medications  ibuprofen (ADVIL,MOTRIN) tablet 600 mg (600 mg Oral Given 04/17/17 2157)     Initial Impression / Assessment and Plan / ED Course  I have reviewed the triage vital signs and the nursing notes.  Pertinent labs  & imaging results that were available during my care of the patient were reviewed by me and considered in my medical decision making (see chart for details).     57 y.o. female who presents for evaluation of left upper extremity pain that began after mechanical fall just prior to arrival.  Patient reports that she landed with her arm folded against her body.  Denies having hand outstretched.  Since then has had pain to the forearm up to elbow to mid upper arm.  No numbness/weakness. Patient is afebrile, non-toxic appearing, sitting comfortably on examination table. Vital signs reviewed.  Initial ED arrival, patient was tachycardic and hypertensive.  Likely secondary to pain.  Will treat pain and reassess.  Patient is neurovascularly intact.  On exam, patient does exhibit some tenderness and soft tissue swelling to the forearm.  No deformity or crepitus noted.  Good range of motion but with subjective reports of pain.  X-rays ordered at triage.  Consider fracture versus dislocation versus sprain versus contusion.  X-rays reviewed.  Wrist x-ray shows some soft tissue swelling but no bony abnormality.  Elbow x-ray is negative for any acute fracture dislocation.  No evidence of effusion.  No anterior posterior fat pad sign noted.  Shoulder x-ray negative for any acute abnormality.  Discussed results with patient.  Will place patient in a splint and sling for support.  Patient given instructions to follow-up for any worsening symptoms.  Discussed RICE protocol with patient.  Repeat blood pressure still elevated but slightly improved.  Likely secondary to pain.  Instructed patient to follow-up with primary care doctor regarding blood pressure. Patient had ample opportunity for questions and discussion. All patient's questions were answered with full understanding. Strict return precautions discussed. Patient expresses understanding and agreement to plan.    Final Clinical Impressions(s) / ED Diagnoses   Final  diagnoses:  Left arm pain  Sprain of left upper arm, initial encounter  Sprain of left wrist, initial encounter    ED Discharge Orders    None       Desma Mcgregor 04/17/17 2231    Pattricia Boss, MD 04/18/17 2223

## 2017-04-17 NOTE — ED Notes (Signed)
Ortho Tech paged.

## 2017-04-17 NOTE — Progress Notes (Signed)
Orthopedic Tech Progress Note Patient Details:  Nicole Osborne Jul 14, 1960 [id number]  Ortho Devices Type of Ortho Device: Arm sling, Velcro wrist splint Ortho Device/Splint Location: LUE Ortho Device/Splint Interventions: Ordered, Application   Post Interventions Patient Tolerated: Well Instructions Provided: Care of device   Braulio Bosch 04/17/2017, 10:19 PM

## 2017-04-17 NOTE — Discharge Instructions (Signed)
As we discussed, today your x-rays were negative for any acute fracture or dislocation.  As we discussed, sometimes a fracture can be missed on initial x-ray given the soft tissue swelling.  If her symptoms continue to worsen, follow-up with your primary care doctor for repeat imaging.  I have also provided Ortho referral if her symptoms do not improve in the next 2 weeks, you can follow-up with them.  You can use the sling and splint for support and stabilization.  As we discussed, you need to take the arm out of the sling and perform range of motion exercises frequently to prevent any further complications.  You can take Tylenol or Ibuprofen as directed for pain. You can alternate Tylenol and Ibuprofen every 4 hours. If you take Tylenol at 1pm, then you can take Ibuprofen at 5pm. Then you can take Tylenol again at 9pm.   As we discussed, today your blood pressure was elevated while in the department.  You need to have your primary care doctor reevaluate this for further evaluation.  Return the emergency department for any worsening pain, discoloration of the arm, numbness/weakness, any other worsening or concerning symptoms.

## 2017-05-07 DIAGNOSIS — M25522 Pain in left elbow: Secondary | ICD-10-CM | POA: Diagnosis not present

## 2017-05-07 DIAGNOSIS — S52135A Nondisplaced fracture of neck of left radius, initial encounter for closed fracture: Secondary | ICD-10-CM | POA: Diagnosis not present

## 2017-06-01 MED FILL — LISINOPRIL-HCTZ 20-25 MG TA: 20-25 | 90 days supply | Qty: 45 | Fill #1

## 2017-06-04 DIAGNOSIS — S52135A Nondisplaced fracture of neck of left radius, initial encounter for closed fracture: Secondary | ICD-10-CM | POA: Diagnosis not present

## 2017-06-04 DIAGNOSIS — S52135D Nondisplaced fracture of neck of left radius, subsequent encounter for closed fracture with routine healing: Secondary | ICD-10-CM | POA: Diagnosis not present

## 2017-09-21 DIAGNOSIS — N951 Menopausal and female climacteric states: Secondary | ICD-10-CM | POA: Diagnosis not present

## 2017-09-21 DIAGNOSIS — R739 Hyperglycemia, unspecified: Secondary | ICD-10-CM | POA: Diagnosis not present

## 2017-09-21 DIAGNOSIS — R5383 Other fatigue: Secondary | ICD-10-CM | POA: Diagnosis not present

## 2017-09-21 DIAGNOSIS — Z0000 Encounter for general adult medical examination without abnormal findings: Secondary | ICD-10-CM | POA: Diagnosis not present

## 2017-09-21 DIAGNOSIS — E785 Hyperlipidemia, unspecified: Secondary | ICD-10-CM | POA: Diagnosis not present

## 2017-09-21 DIAGNOSIS — I10 Essential (primary) hypertension: Secondary | ICD-10-CM | POA: Diagnosis not present

## 2017-09-23 DIAGNOSIS — Z0389 Encounter for observation for other suspected diseases and conditions ruled out: Secondary | ICD-10-CM | POA: Diagnosis not present

## 2017-09-24 MED FILL — LISINOPRIL-HCTZ 20-25 MG TA: 20-25 | 90 days supply | Qty: 45 | Fill #2

## 2017-09-27 ENCOUNTER — Other Ambulatory Visit: Payer: Self-pay | Admitting: Osteopathic Medicine

## 2017-09-27 DIAGNOSIS — Z1231 Encounter for screening mammogram for malignant neoplasm of breast: Secondary | ICD-10-CM

## 2017-09-28 ENCOUNTER — Encounter: Payer: Self-pay | Admitting: Osteopathic Medicine

## 2017-09-28 ENCOUNTER — Ambulatory Visit (INDEPENDENT_AMBULATORY_CARE_PROVIDER_SITE_OTHER): Payer: 59 | Admitting: Osteopathic Medicine

## 2017-09-28 VITALS — BP 105/62 | HR 70 | Temp 98.2°F | Wt 192.8 lb

## 2017-09-28 DIAGNOSIS — I10 Essential (primary) hypertension: Secondary | ICD-10-CM

## 2017-09-28 DIAGNOSIS — R1011 Right upper quadrant pain: Secondary | ICD-10-CM | POA: Diagnosis not present

## 2017-09-28 MED ORDER — LISINOPRIL-HYDROCHLOROTHIAZIDE 20-25 MG PO TABS: 0.5000 | ORAL_TABLET | Freq: Every day | ORAL | 1 refills | Status: DC

## 2017-09-28 NOTE — Patient Instructions (Signed)
Plan: Refills sent for BP meds Will see what Dr Lanice Shirts records show and go from there If abdominal issues worsen or change, let me know ASAP!

## 2017-09-28 NOTE — Progress Notes (Signed)
HPI: Nicole Osborne is a 57 y.o. female who  has a past medical history of Anemia, Colon polyp, Depression, Diverticulosis, Gallbladder sludge, Hypertension, and Hypertriglyceridemia.  she presents to [facility] today, 09/28/17,  for chief complaint of: Pap Blood pressure check RUQ pain persisting   Not due for Pap test, will defer  RUQ pain: persistent discomfort occasionally going into the shoulder/back area. Abd [place] (+)sludge w/o stones at last check.   Recently seen by a "wellness" physician at the suggestion of a friend. Labs done there/ This person is in the [facility] system but I can't see records.   BP: at goal, no CP/SOB, no dizziness.     Past medical, surgical, social and family history reviewed:  Patient Active Problem List   Diagnosis Date Noted  . Chest pain 01/22/2016  . Hyperglycemia 07/23/2014  . Elevated LFTs 04/25/2014  . History of colonoscopy 03/28/2014  . Hypertension 03/02/2014  . Hyperlipidemia 03/02/2014  . Depression 03/02/2014    Past Surgical History:  Procedure Laterality Date  . BREAST BIOPSY Right   . CESAREAN SECTION     x 3  . DILATION AND CURETTAGE OF UTERUS    . TUBAL LIGATION      Social History   Tobacco Use  . Smoking status: Never Smoker  . Smokeless tobacco: Never Used  Substance Use Topics  . Alcohol use: Yes    Comment: occasional    Family History  Problem Relation Age of Onset  . Hypertension Mother   . Diabetes Mother   . Lung cancer Father   . Breast cancer Sister   . Leukemia Sister   . Colon cancer Maternal Grandmother   . Lung cancer Sister   . CAD Neg Hx      Current medication list and allergy/intolerance information reviewed:    Current Outpatient Medications  Medication Sig Dispense Refill  . lisinopril-hydrochlorothiazide (PRINZIDE,ZESTORETIC) 20-25 MG tablet TAKE ONE-HALF TABLET BY MOUTH DAILY. 45 tablet 3  . Vilazodone HCl (VIIBRYD) 40 MG TABS Take 1 tablet  (40 mg total) by mouth daily. 90 tablet 3   No current facility-administered medications for this visit.     No Known Allergies    Review of Systems:  Constitutional:  No  fever, no chills, No recent illness, No unintentional weight changes. No significant fatigue.   HEENT: No  headache, no vision change, no hearing change, No sore throat, No  sinus pressure  Cardiac: No  chest pain, No  pressure, No palpitations, No  Orthopnea  Respiratory:  No  shortness of breath. No  Cough  Gastrointestinal: +abdominal pain, No  nausea, No  vomiting,  No  blood in stool, No  diarrhea, No  constipation   Musculoskeletal: No new myalgia/arthralgia  Skin: No  Rash  Neurologic: No  weakness, No  dizziness,   Exam:  BP 105/62 (BP Location: Left Arm, Patient Position: Sitting, Cuff Size: Normal)   Pulse 70   Temp 98.2 F (36.8 C) (Oral)   Wt 192 lb 12.8 oz (87.5 kg)   BMI 33.09 kg/m   Constitutional: VS see above. General Appearance: alert, well-developed, well-nourished, NAD  Eyes: Normal lids and conjunctive, non-icteric sclera  Ears, Nose, Mouth, Throat: MMM, Normal external inspection ears/nares/mouth/lips/gums.  Neck: No masses, trachea midline.  Respiratory: Normal respiratory effort. no wheeze, no rhonchi, no rales  Cardiovascular: S1/S2 normal, no murmur, no rub/gallop auscultated. RRR.   Gastrointestinal: Nontender, no masses. No hepatomegaly, no splenomegaly. No hernia appreciated.  Bowel sounds normal. Rectal exam deferred.   Musculoskeletal: Gait normal. No clubbing/cyanosis of digits.   Neurological: Normal balance/coordination. No tremor.  Skin: warm, dry, intact. No rash/ulcer  Psychiatric: Normal judgment/insight. Normal mood and affect. Oriented x3.    Mm 3d Screen Breast Bilateral  Result Date: 09/29/2017 CLINICAL DATA:  Screening. History of RIGHT breast biopsy. EXAM: DIGITAL SCREENING BILATERAL MAMMOGRAM WITH TOMO AND CAD COMPARISON:  Previous exam(s). ACR  Breast Density Category b: There are scattered areas of fibroglandular density. FINDINGS: There are no findings suspicious for malignancy. Images were processed with CAD. IMPRESSION: No mammographic evidence of malignancy. A result letter of this screening mammogram will be mailed directly to the patient. RECOMMENDATION: Screening mammogram in one year. (Code:SM-B-01Y) BI-RADS CATEGORY  1: Negative. Electronically Signed   By: Franki Cabot M.D.   On: 09/29/2017 14:25     ASSESSMENT/PLAN:   Right upper quadrant abdominal pain - liekly gallbladder etiology, consider repeat US or GI referral, pt will consider these options   Essential hypertension - Plan: lisinopril-hydrochlorothiazide (PRINZIDE,ZESTORETIC) 20-25 MG tablet    Patient Instructions  Plan: Refills sent for BP meds Will see what Dr Lanice Shirts records show and go from there If abdominal issues worsen or change, let me know ASAP!     Visit summary with medication list and pertinent instructions was printed for patient to review. All questions at time of visit were answered - patient instructed to contact office with any additional concerns. ER/RTC precautions were reviewed with the patient.   Follow-up plan: Return for recheck depending on records review .  Note: Total time spent 25 minutes, greater than 50% of the visit was spent face-to-face counseling and coordinating care for the following: The primary encounter diagnosis was Right upper quadrant abdominal pain. A diagnosis of Essential hypertension was also pertinent to this visit.Marland Kitchen  Please note: voice recognition software was used to produce this document, and typos may escape review. Please contact Dr. Sheppard Coil for any needed clarifications.

## 2017-09-29 ENCOUNTER — Ambulatory Visit (INDEPENDENT_AMBULATORY_CARE_PROVIDER_SITE_OTHER): Payer: 59

## 2017-09-29 DIAGNOSIS — Z1231 Encounter for screening mammogram for malignant neoplasm of breast: Secondary | ICD-10-CM

## 2017-09-30 ENCOUNTER — Encounter: Payer: Self-pay | Admitting: Osteopathic Medicine

## 2017-10-13 DIAGNOSIS — E669 Obesity, unspecified: Secondary | ICD-10-CM | POA: Diagnosis not present

## 2017-10-13 DIAGNOSIS — E559 Vitamin D deficiency, unspecified: Secondary | ICD-10-CM | POA: Diagnosis not present

## 2017-10-13 DIAGNOSIS — R739 Hyperglycemia, unspecified: Secondary | ICD-10-CM | POA: Diagnosis not present

## 2017-10-13 DIAGNOSIS — I10 Essential (primary) hypertension: Secondary | ICD-10-CM | POA: Diagnosis not present

## 2018-01-04 MED FILL — LISINOPRIL-HCTZ 20-25 MG TA: 20-25 | 90 days supply | Qty: 45 | Fill #3

## 2018-01-11 DIAGNOSIS — E785 Hyperlipidemia, unspecified: Secondary | ICD-10-CM | POA: Diagnosis not present

## 2018-01-11 DIAGNOSIS — N951 Menopausal and female climacteric states: Secondary | ICD-10-CM | POA: Diagnosis not present

## 2018-01-11 DIAGNOSIS — I10 Essential (primary) hypertension: Secondary | ICD-10-CM | POA: Diagnosis not present

## 2018-01-11 DIAGNOSIS — E669 Obesity, unspecified: Secondary | ICD-10-CM | POA: Diagnosis not present

## 2018-01-11 MED FILL — ESTRADIOL 0.5 MG TABLET: 0.5 | 30 days supply | Qty: 30 | Fill #0

## 2018-01-11 MED FILL — NP THYROID 60 MG TABLET: 60 | 30 days supply | Qty: 30 | Fill #0

## 2018-01-11 MED FILL — PROGESTERONE 100 MG CAPSULE: 100 | 30 days supply | Qty: 30 | Fill #0

## 2018-02-14 MED FILL — ESTRADIOL 0.5 MG TABLET: 0.5 | 30 days supply | Qty: 30 | Fill #1

## 2018-02-14 MED FILL — NP THYROID 60 MG TABLET: 60 | 30 days supply | Qty: 30 | Fill #1

## 2018-02-14 MED FILL — PROGESTERONE 100 MG CAPSULE: 100 | 30 days supply | Qty: 30 | Fill #1

## 2018-02-22 DIAGNOSIS — R739 Hyperglycemia, unspecified: Secondary | ICD-10-CM | POA: Diagnosis not present

## 2018-02-22 DIAGNOSIS — E785 Hyperlipidemia, unspecified: Secondary | ICD-10-CM | POA: Diagnosis not present

## 2018-02-22 DIAGNOSIS — E559 Vitamin D deficiency, unspecified: Secondary | ICD-10-CM | POA: Diagnosis not present

## 2018-02-22 DIAGNOSIS — N951 Menopausal and female climacteric states: Secondary | ICD-10-CM | POA: Diagnosis not present

## 2018-02-22 DIAGNOSIS — I10 Essential (primary) hypertension: Secondary | ICD-10-CM | POA: Diagnosis not present

## 2018-02-22 DIAGNOSIS — R5383 Other fatigue: Secondary | ICD-10-CM | POA: Diagnosis not present

## 2018-02-23 MED FILL — PROGESTERONE 200 MG CAPSULE: 200 | 30 days supply | Qty: 30 | Fill #0

## 2018-03-15 MED FILL — ESTRADIOL 0.5 MG TABLET: 0.5 | 30 days supply | Qty: 30 | Fill #2

## 2018-03-15 MED FILL — NP THYROID 60 MG TABLET: 60 | 30 days supply | Qty: 30 | Fill #2

## 2018-03-28 ENCOUNTER — Encounter: Payer: Self-pay | Admitting: Physician Assistant

## 2018-03-28 ENCOUNTER — Ambulatory Visit (INDEPENDENT_AMBULATORY_CARE_PROVIDER_SITE_OTHER): Payer: 59 | Admitting: Physician Assistant

## 2018-03-28 ENCOUNTER — Ambulatory Visit (INDEPENDENT_AMBULATORY_CARE_PROVIDER_SITE_OTHER): Payer: 59

## 2018-03-28 VITALS — BP 113/73 | HR 81 | Temp 98.7°F | Wt 182.0 lb

## 2018-03-28 DIAGNOSIS — R05 Cough: Secondary | ICD-10-CM

## 2018-03-28 DIAGNOSIS — J22 Unspecified acute lower respiratory infection: Secondary | ICD-10-CM | POA: Diagnosis not present

## 2018-03-28 DIAGNOSIS — K760 Fatty (change of) liver, not elsewhere classified: Secondary | ICD-10-CM

## 2018-03-28 DIAGNOSIS — K828 Other specified diseases of gallbladder: Secondary | ICD-10-CM

## 2018-03-28 DIAGNOSIS — R1011 Right upper quadrant pain: Secondary | ICD-10-CM | POA: Diagnosis not present

## 2018-03-28 MED ORDER — HYDROCOD POLST-CPM POLST ER 10-8 MG/5ML PO SUER: 5.0000 mL | Freq: Two times a day (BID) | ORAL | 0 refills | Status: AC | PRN

## 2018-03-28 MED FILL — HYDROCODONE-CHLORPHEN ER SU: 10-8 | 5 days supply | Qty: 50 | Fill #0

## 2018-03-28 NOTE — Progress Notes (Signed)
HPI:                                                                Nicole Osborne is a 57 y.o. female who presents to [facility] [place] today for cough  Cough  This is a new problem. The current episode started in the past 7 days. The problem has been waxing and waning. The cough is productive of sputum and productive of purulent sputum. Associated symptoms include chills, myalgias, nasal congestion and rhinorrhea. Pertinent negatives include no fever. Associated symptoms comments: + nausea. Nothing aggravates the symptoms. Risk factors for lung disease include travel. Treatments tried: Tylenol. The treatment provided no relief. There is no history of asthma, COPD or pneumonia. +NAFL  Abdominal Pain  This is a recurrent problem. The current episode started in the past 7 days. The onset quality is undetermined. The problem occurs daily. The problem has been unchanged. The pain is located in the RUQ. The quality of the pain is colicky. The abdominal pain does not radiate. Associated symptoms include myalgias and nausea. Pertinent negatives include no constipation, diarrhea, fever, hematochezia or vomiting. Nothing aggravates the pain. The pain is relieved by nothing. Treatments tried: diet, weight loss. The treatment provided no relief. Gallstones: gallbladder sludge. +NAFL      Depression screen [facility] 2/9 03/12/2016  Decreased Interest 0  Down, Depressed, Hopeless 0  PHQ - 2 Score 0    No flowsheet data found.    Past Medical History:  Diagnosis Date  . Anemia   . Colon polyp   . Depression   . Diverticulosis   . Gallbladder sludge   . Hypertension   . Hypertriglyceridemia    Past Surgical History:  Procedure Laterality Date  . BREAST BIOPSY Right   . CESAREAN SECTION     x 3  . DILATION AND CURETTAGE OF UTERUS    . TUBAL LIGATION     Social History   Tobacco Use  . Smoking status: Never Smoker  . Smokeless tobacco: Never  Used  Substance Use Topics  . Alcohol use: Yes    Comment: occasional   family history includes Breast cancer in her sister; Colon cancer in her maternal grandmother; Diabetes in her mother; Hypertension in her mother; Leukemia in her sister; Lung cancer in her father and sister.    ROS: negative except as noted in the HPI  Medications: Current Outpatient Medications  Medication Sig Dispense Refill  . lisinopril-hydrochlorothiazide (PRINZIDE,ZESTORETIC) 20-25 MG tablet Take 0.5 tablets by mouth daily. 90 tablet 1   No current facility-administered medications for this visit.    No Known Allergies     Objective:  BP 113/73   Pulse 81   Temp 98.7 F (37.1 C) (Oral)   Wt 182 lb (82.6 kg)   SpO2 96%   BMI 31.24 kg/m  Gen:  alert, not ill-appearing, no distress, appropriate for age 34: head normocephalic without obvious abnormality, conjunctiva and cornea clear, right TM pearly gray and semitransparent, left TM with small scar otherwise pearly gray and semitransparent, nasal mucosa pink, no sinus tenderness, oropharynx clear, no exudates, uvula midline, neck supple, no cervical adenopathy trachea midline Pulm: Normal work of breathing, normal phonation, clear to auscultation bilaterally, no wheezes, rales  or rhonchi CV: Normal rate, regular rhythm, s1 and s2 distinct, no murmurs, clicks or rubs  GI: abdomen soft, LUQ tenderness, no guarding or rigidity, negative Murphy's sign Neuro: alert and oriented x 3, no tremor MSK: extremities atraumatic, normal gait and station Skin: intact, no rashes on exposed skin, no jaundice, no cyanosis  Lab Results  Component Value Date   ALT 33 (H) 09/08/2016   AST 36 (H) 09/08/2016   ALKPHOS 77 09/08/2016   BILITOT 0.9 09/08/2016   CLINICAL DATA:  Right upper quadrant pain for 2 days  EXAM: ULTRASOUND ABDOMEN LIMITED RIGHT UPPER QUADRANT  COMPARISON:  None.  FINDINGS: Gallbladder:  Minimal dependent luminal echoes/ sludge.  No gallstones or wall thickening visualized. No sonographic Murphy sign noted by sonographer.  Common bile duct:  Diameter: 5 mm.  Where visualized, no filling defect.  Liver:  Echogenic liver with sparing around the gallbladder fossa. No evidence of mass lesion. Antegrade flow in the imaged portal venous system.  IMPRESSION: 1. Hepatic steatosis. 2. Minimal sludge in the gallbladder. No gallstone or cholecystitis findings.   Electronically Signed   By: Monte Fantasia M.D.   On: 09/09/2016 09:40  No results found for this or any previous visit (from the past 72 hour(s)). No results found.    Assessment and Plan: 57 y.o. female with   .[name] was seen today for cough.  Diagnoses and all orders for this visit:  Acute lower respiratory infection -     DG Chest 2 View -     chlorpheniramine-HYDROcodone (TUSSIONEX) 10-8 MG/5ML SUER; Take 5 mLs by mouth every 12 (twelve) hours as needed for up to 5 days for cough.  Colicky RUQ abdominal pain -     CBC with Differential/Platelet -     COMPLETE METABOLIC PANEL WITH GFR -     Lipase -     US Abdomen Limited RUQ; Future  Fatty liver disease, nonalcoholic  Gallbladder sludge     Cough Afebrile, no tachypnea, no tachycardia, pulse ox 96% on room air at rest, no adventitious lung sounds Symptoms present for less than 1 week Chest x-ray to assess for infiltrate Counseled on supportive care for viral respiratory infection  Right upper quadrant abdominal pain History of gallbladder sludge and nonalcoholic fatty liver disease Has been over a year since she had any labs or imaging CBC, CMP, lipase pending We will repeat right upper quadrant ultrasound due to current biliary colic symptoms She declined referral to general surgery to discuss elective cholecystectomy Counseled on low-fat diet Follow-up with PCP in 2 weeks   Patient education and anticipatory guidance given Patient agrees with treatment  plan Follow-up as needed if symptoms worsen or fail to improve  Darlyne Russian PA-C

## 2018-03-28 NOTE — Patient Instructions (Addendum)
For nasal symptoms/sinusitis: - nasal saline rinses / netti pot several times per day (do this prior to nasal spray) - you can use OTC nasal decongestant sprays like Afrin (oxymetazoline) BUT do not use for more than 3 days as it will cause worsening congestion/nasal symptoms) - warm facial compresses - oral decongestants and antihistamines like Claritin-D and Zyrtec-D may help dry up secretions (caution using decongestants if you have high blood pressure, heart disease or kidney disease) - for sinus headache: Tylenol 1000mg  every 8 hours as needed. Alternate with Ibuprofen 600mg  every 6 hours  For sore throat: - Tylenol 1000mg  every 8 hours as needed for throat pain. Alternate with Ibuprofen 600mg  every 6 hours - Cepacol throat lozenges and/or Chloraseptic spray - Warm salt water gargles  For cough: - Cough is a protective mechanism and an important part of fighting an infection. I encourage you to avoid suppressing your cough with medication during the day if possible.  - Mucinex with at least 8 oz. of water can loosen chest congestion and make cough more productive, which means you will actually cough less - Okay to use a cough suppressant at bedtime in order to rest  Note: follow package instructions for all over-the-counter medications. If using multi-symptom medications (Dayquil, Theraflu, etc.), check the label for duplicate drug ingredients.   Nonalcoholic Fatty Liver Disease Diet Nonalcoholic fatty liver disease is a condition that causes fat to accumulate in and around the liver. The disease makes it harder for the liver to work the way that it should. Following a healthy diet can help to keep nonalcoholic fatty liver disease under control. It can also help to prevent or improve conditions that are associated with the disease, such as heart disease, diabetes, high blood pressure, and abnormal cholesterol levels. Along with regular exercise, this diet:  Promotes weight loss.  Helps  to control blood sugar levels.  Helps to improve the way that the body uses insulin. What do I need to know about this diet?  Use the glycemic index (GI) to plan your meals. The index tells you how quickly a food will raise your blood sugar. Choose low-GI foods. These foods take a longer time to raise blood sugar.  Keep track of how many calories you take in. Eating the right amount of calories will help you to achieve a healthy weight.  You may want to follow a Mediterranean diet. This diet includes a lot of vegetables, lean meats or fish, whole grains, fruits, and healthy oils and fats. What foods can I eat? Grains Whole grains, such as whole-wheat or whole-grain breads, crackers, tortillas, cereals, and pasta. Stone-ground whole wheat. Pumpernickel bread. Unsweetened oatmeal. Bulgur. Barley. Quinoa. Brown or wild rice. Corn or whole-wheat flour tortillas. Vegetables Lettuce. Spinach. Peas. Beets. Cauliflower. Cabbage. Broccoli. Carrots. Tomatoes. Squash. Eggplant. Herbs. Peppers. Onions. Cucumbers. Brussels sprouts. Yams and sweet potatoes. Beans. Lentils. Fruits Bananas. Apples. Oranges. Grapes. Papaya. Mango. Pomegranate. Kiwi. Grapefruit. Cherries. Meats and Other Protein Sources Seafood and shellfish. Lean meats. Poultry. Tofu. Dairy Low-fat or fat-free dairy products, such as yogurt, cottage cheese, and cheese. Beverages Water. Sugar-free drinks. Tea. Coffee. Low-fat or skim milk. Milk alternatives, such as soy or almond milk. Real fruit juice. Condiments Mustard. Relish. Low-fat, low-sugar ketchup and barbecue sauce. Low-fat or fat-free mayonnaise. Sweets and Desserts Sugar-free sweets. Fats and Oils Avocado. Canola or olive oil. Nuts and nut butters. Seeds. The items listed above may not be a complete list of recommended foods or beverages. Contact your dietitian for  more options. What foods are not recommended? Palm oil and coconut oil. Processed foods. Fried foods.  Sweetened drinks, such as sweet tea, milkshakes, snow cones, iced sweet drinks, and sodas. Alcohol. Sweets. Foods that contain a lot of salt or sodium. The items listed above may not be a complete list of foods and beverages to avoid. Contact your dietitian for more information. This information is not intended to replace advice given to you by your health care provider. Make sure you discuss any questions you have with your health care provider. Document Released: 07/31/2014 Document Revised: 08/22/2015 Document Reviewed: 04/10/2014 Elsevier Interactive Patient Education  2019 [employer].

## 2018-03-29 ENCOUNTER — Ambulatory Visit (INDEPENDENT_AMBULATORY_CARE_PROVIDER_SITE_OTHER): Payer: 59

## 2018-03-29 DIAGNOSIS — K828 Other specified diseases of gallbladder: Secondary | ICD-10-CM | POA: Diagnosis not present

## 2018-03-29 DIAGNOSIS — K760 Fatty (change of) liver, not elsewhere classified: Secondary | ICD-10-CM

## 2018-03-29 DIAGNOSIS — R1011 Right upper quadrant pain: Secondary | ICD-10-CM

## 2018-03-29 LAB — COMPLETE METABOLIC PANEL WITH GFR
AG Ratio: 1.5 (calc) (ref 1.0–2.5)
ALT: 27 U/L (ref 6–29)
AST: 36 U/L — AB (ref 10–35)
Albumin: 4.1 g/dL (ref 3.6–5.1)
Alkaline phosphatase (APISO): 94 U/L (ref 33–130)
BUN/Creatinine Ratio: 10 (calc) (ref 6–22)
BUN: 6 mg/dL — ABNORMAL LOW (ref 7–25)
CO2: 27 mmol/L (ref 20–32)
CREATININE: 0.63 mg/dL (ref 0.50–1.05)
Calcium: 9.2 mg/dL (ref 8.6–10.4)
Chloride: 102 mmol/L (ref 98–110)
GFR, Est African American: 115 mL/min/{1.73_m2} (ref >=60)
GFR, Est Non African American: 99 mL/min/{1.73_m2} (ref >=60)
GLOBULIN: 2.7 g/dL (ref 1.9–3.7)
Glucose, Bld: 95 mg/dL (ref 65–99)
Potassium: 3.7 mmol/L (ref 3.5–5.3)
SODIUM: 139 mmol/L (ref 135–146)
Total Bilirubin: 0.6 mg/dL (ref 0.2–1.2)
Total Protein: 6.8 g/dL (ref 6.1–8.1)

## 2018-03-29 LAB — CBC WITH DIFFERENTIAL/PLATELET
Absolute Monocytes: 828 cells/uL (ref 200–950)
Basophils Absolute: 58 cells/uL (ref 0–200)
Basophils Relative: 0.5 %
EOS PCT: 3.6 %
Eosinophils Absolute: 414 cells/uL (ref 15–500)
HEMATOCRIT: 42.4 % (ref 35.0–45.0)
Hemoglobin: 14.3 g/dL (ref 11.7–15.5)
LYMPHS ABS: 3450 {cells}/uL (ref 850–3900)
MCH: 28.1 pg (ref 27.0–33.0)
MCHC: 33.7 g/dL (ref 32.0–36.0)
MCV: 83.5 fL (ref 80.0–100.0)
MPV: 11.3 fL (ref 7.5–12.5)
Monocytes Relative: 7.2 %
NEUTROS ABS: 6751 {cells}/uL (ref 1500–7800)
NEUTROS PCT: 58.7 %
Platelets: 319 10*3/uL (ref 140–400)
RBC: 5.08 10*6/uL (ref 3.80–5.10)
RDW: 12 % (ref 11.0–15.0)
Total Lymphocyte: 30 %
WBC: 11.5 10*3/uL — ABNORMAL HIGH (ref 3.8–10.8)

## 2018-03-29 LAB — LIPASE: Lipase: 57 U/L (ref 7–60)

## 2018-04-07 ENCOUNTER — Telehealth: Payer: Self-pay | Admitting: Osteopathic Medicine

## 2018-04-07 NOTE — Telephone Encounter (Signed)
Patient left voicemail to cancel appointment. Did not want to reschedule at this time.

## 2018-04-11 ENCOUNTER — Ambulatory Visit: Payer: 59 | Admitting: Osteopathic Medicine

## 2018-04-14 MED FILL — LISINOPRIL-HCTZ 20-25 MG TA: 20-25 | 90 days supply | Qty: 45 | Fill #0

## 2018-04-14 MED FILL — PROGESTERONE 100 MG CAPSULE: 100 | 30 days supply | Qty: 30 | Fill #2

## 2018-04-14 MED FILL — ESTRADIOL 0.5 MG TABLET: 0.5 | 30 days supply | Qty: 30 | Fill #3

## 2018-04-14 MED FILL — NP THYROID 60 MG TABLET: 60 | 30 days supply | Qty: 30 | Fill #3

## 2018-04-20 DIAGNOSIS — R6882 Decreased libido: Secondary | ICD-10-CM | POA: Diagnosis not present

## 2018-04-20 DIAGNOSIS — I10 Essential (primary) hypertension: Secondary | ICD-10-CM | POA: Diagnosis not present

## 2018-04-20 DIAGNOSIS — R5383 Other fatigue: Secondary | ICD-10-CM | POA: Diagnosis not present

## 2018-04-20 DIAGNOSIS — N951 Menopausal and female climacteric states: Secondary | ICD-10-CM | POA: Diagnosis not present

## 2018-05-09 MED FILL — PROGESTERONE 100 MG CAPSULE: 100 | 30 days supply | Qty: 30 | Fill #3

## 2018-05-23 MED FILL — NP THYROID 60 MG TABLET: 60 | 30 days supply | Qty: 30 | Fill #0

## 2018-05-23 MED FILL — ESTRADIOL 0.5 MG TABS: 0.5 | 30 days supply | Qty: 30 | Fill #0

## 2018-06-02 DIAGNOSIS — E669 Obesity, unspecified: Secondary | ICD-10-CM | POA: Diagnosis not present

## 2018-06-02 DIAGNOSIS — R5383 Other fatigue: Secondary | ICD-10-CM | POA: Diagnosis not present

## 2018-06-02 DIAGNOSIS — I10 Essential (primary) hypertension: Secondary | ICD-10-CM | POA: Diagnosis not present

## 2018-06-02 DIAGNOSIS — R739 Hyperglycemia, unspecified: Secondary | ICD-10-CM | POA: Diagnosis not present

## 2018-06-02 DIAGNOSIS — R6882 Decreased libido: Secondary | ICD-10-CM | POA: Diagnosis not present

## 2018-06-02 DIAGNOSIS — N951 Menopausal and female climacteric states: Secondary | ICD-10-CM | POA: Diagnosis not present

## 2018-06-02 MED FILL — PROGESTERONE 200 MG CAPSULE: 200 | 30 days supply | Qty: 30 | Fill #0

## 2018-06-16 MED FILL — PROGESTERONE 200 MG CAPSULE: 200 | 30 days supply | Qty: 60 | Fill #0

## 2018-06-21 MED FILL — NP THYROID 60 MG TABLET: 60 | 30 days supply | Qty: 30 | Fill #1

## 2018-06-21 MED FILL — ESTRADIOL 0.5 MG TABS: 0.5 | 30 days supply | Qty: 30 | Fill #1

## 2018-07-18 MED FILL — LISINOPRIL-HCTZ 20-25 MG TA: 20-25 | 90 days supply | Qty: 45 | Fill #1

## 2018-10-12 ENCOUNTER — Ambulatory Visit (INDEPENDENT_AMBULATORY_CARE_PROVIDER_SITE_OTHER): Payer: 59 | Admitting: Osteopathic Medicine

## 2018-10-12 ENCOUNTER — Encounter: Payer: Self-pay | Admitting: Osteopathic Medicine

## 2018-10-12 ENCOUNTER — Other Ambulatory Visit: Payer: Self-pay

## 2018-10-12 VITALS — BP 131/85 | HR 76 | Temp 98.7°F | Wt 191.0 lb

## 2018-10-12 DIAGNOSIS — I10 Essential (primary) hypertension: Secondary | ICD-10-CM

## 2018-10-12 DIAGNOSIS — K828 Other specified diseases of gallbladder: Secondary | ICD-10-CM | POA: Diagnosis not present

## 2018-10-12 DIAGNOSIS — Z0000 Encounter for general adult medical examination without abnormal findings: Secondary | ICD-10-CM | POA: Diagnosis not present

## 2018-10-12 DIAGNOSIS — Z23 Encounter for immunization: Secondary | ICD-10-CM | POA: Diagnosis not present

## 2018-10-12 DIAGNOSIS — K760 Fatty (change of) liver, not elsewhere classified: Secondary | ICD-10-CM

## 2018-10-12 DIAGNOSIS — N950 Postmenopausal bleeding: Secondary | ICD-10-CM | POA: Diagnosis not present

## 2018-10-12 DIAGNOSIS — Z1239 Encounter for other screening for malignant neoplasm of breast: Secondary | ICD-10-CM | POA: Diagnosis not present

## 2018-10-12 NOTE — Progress Notes (Signed)
HPI: Nicole Osborne is a 58 y.o. female who  has a past medical history of Anemia, Colon polyp, Depression, Diverticulosis, Gallbladder sludge, Hypertension, and Hypertriglyceridemia.  she presents to [facility] today, 10/12/18,  for chief complaint of: Annual Physical     Patient here for annual physical / wellness exam.  See preventive care reviewed as below.    Additional concerns today include:   Had brief episode of vaginal bleeding earlier this spring, around February, after administration of a testosterone shot from definitive medicine practitioner.  Had a brief episode of spotting maybe a few weeks after stopping the shot and has had nothing since then.   Past medical, surgical, social and family history reviewed:  Patient Active Problem List   Diagnosis Date Noted  . Colicky RUQ abdominal pain 03/28/2018  . Fatty liver disease, nonalcoholic 13/10/6576  . Gallbladder sludge 03/28/2018  . Chest pain 01/22/2016  . Hyperglycemia 07/23/2014  . Elevated LFTs 04/25/2014  . History of colonoscopy 03/28/2014  . Hypertension 03/02/2014  . Hyperlipidemia 03/02/2014  . Depression 03/02/2014    Past Surgical History:  Procedure Laterality Date  . BREAST BIOPSY Right   . CESAREAN SECTION     x 3  . DILATION AND CURETTAGE OF UTERUS    . TUBAL LIGATION      Social History   Tobacco Use  . Smoking status: Never Smoker  . Smokeless tobacco: Never Used  Substance Use Topics  . Alcohol use: Yes    Comment: occasional    Family History  Problem Relation Age of Onset  . Hypertension Mother   . Diabetes Mother   . Lung cancer Father   . Breast cancer Sister   . Leukemia Sister   . Colon cancer Maternal Grandmother   . Lung cancer Sister   . CAD Neg Hx      Current medication list and allergy/intolerance information reviewed:    Current Meds  Medication Sig  . lisinopril-hydrochlorothiazide (PRINZIDE,ZESTORETIC) 20-25 MG  tablet Take 0.5 tablets by mouth daily.     No Known Allergies    Review of Systems:  Constitutional:  No  fever, no chills, No recent illness, No unintentional weight changes. No significant fatigue.   HEENT: No  headache, no vision change  Cardiac: No  chest pain, No  pressure, No palpitations  Respiratory:  No  shortness of breath. No  Cough  Gastrointestinal: +intermittent epigastric/RUQ abdominal pain, No  nausea, No  vomiting,  No  blood in stool, No  diarrhea, No  constipation   Musculoskeletal: No new myalgia/arthralgia  Skin: No  Rash, No other wounds/concerning lesions  Genitourinary: No  incontinence, No  abnormal genital bleeding, No abnormal genital discharge  Hem/Onc: No  easy bruising/bleeding, No  abnormal lymph node  Endocrine: No cold intolerance,  No heat intolerance. No polyuria/polydipsia/polyphagia   Neurologic: No  weakness, No  dizziness, No  slurred speech/focal weakness/facial droop  Psychiatric: +concerns with depression w/ recent death of her mother, No  concerns with anxiety, No sleep problems, No mood problems  Exam:  BP 131/85 (BP Location: Left Arm, Patient Position: Sitting, Cuff Size: Normal)   Pulse 76   Temp 98.7 F (37.1 C) (Oral)   Wt 191 lb (86.6 kg)   BMI 32.79 kg/m   BP Readings from Last 3 Encounters:  10/12/18 131/85  03/28/18 113/73  09/28/17 105/62    Constitutional: VS see above. General Appearance: alert, well-developed, well-nourished, NAD  Eyes: Normal lids  and conjunctive, non-icteric sclera  Neck: No masses, trachea midline. No thyroid enlargement. No tenderness/mass appreciated. No lymphadenopathy  Respiratory: Normal respiratory effort. no wheeze, no rhonchi, no rales  Cardiovascular: S1/S2 normal, no murmur, no rub/gallop auscultated. RRR. No lower extremity edema.  Gastrointestinal: Nontender, no masses. No hepatomegaly, no splenomegaly. No hernia appreciated. Bowel sounds normal. Rectal exam deferred.    Musculoskeletal: Gait normal. No clubbing/cyanosis of digits.   Neurological: Normal balance/coordination. No tremor. No cranial nerve deficit on limited exam. Motor and sensation intact and symmetric. Cerebellar reflexes intact.   Skin: warm, dry, intact. No rash/ulcer.   Psychiatric: Normal judgment/insight. Normal mood and affect. Oriented x3.       ASSESSMENT/PLAN: The primary encounter diagnosis was Annual physical exam. Diagnoses of Essential hypertension, Fatty liver disease, nonalcoholic, Gallbladder sludge, Need for shingles vaccine, and Breast cancer screening were also pertinent to this visit.   Orders Placed This Encounter  Procedures  . MM 3D SCREEN BREAST BILATERAL  . Varicella-zoster vaccine IM (Shingrix)  . CBC  . COMPLETE METABOLIC PANEL WITH GFR  . Lipid panel  . TSH    No orders of the defined types were placed in this encounter.   Patient Instructions  General Preventive Care  Most recent routine screening lipids/other labs: ordered today. Cholesterol and Diabetes screening recommended annually for people with hypertension.   Tobacco: don't!   Alcohol: responsible moderation is ok for most adults - if you have concerns about your alcohol intake, please talk to me!   Exercise: as tolerated to reduce risk of cardiovascular disease and diabetes. Strength training will also prevent osteoporosis.   Mental health: if need for mental health care (medicines, counseling, other), or concerns about moods, please let me know!   Sexual health: if need for STD testing, or if concerns with libido/pain problems, please let me know!  Advanced Directive: Living Will and/or Healthcare Power of Attorney recommended for all adults, regardless of age or health.  Vaccines  Flu vaccine: recommended for almost everyone, every fall.   Shingles vaccine: Shingrix recommended after age 6. Starting series today!  Pneumonia vaccines: Prevnar and Pneumovax not needed yet.    Tetanus booster: Tdap recommended every 10 years. Let me know when last one was!  Cancer screenings   Colon cancer screening: recommended for everyone age 31-75 - not due this year.   Breast cancer screening: mammogram annually after age 30. Ordered today.   Cervical cancer screening: Pap every 1 to 5 years depending on age and other risk factors. Can usually stop at age 37 or w/ hysterectomy. I'll message OBGYN re: bleeding w/ the medications. Will let you know if any further workup needed.    Lung cancer screening: not needed for non-smokers  Infection screenings . HIV: recommended screening at least once age 47-65, more often as needed. . Gonorrhea/Chlamydia: screening as needed. . Hepatitis C: recommended for anyone born 87-1966 . TB: certain at-risk populations, or depending on work requirements and/or travel history Other . Bone Density Test: recommended for women at age 60, sooner depending on risk factors (over 34 and smoker, fracture)        Visit summary with medication list and pertinent instructions was printed for patient to review. All questions at time of visit were answered - patient instructed to contact office with any additional concerns or updates. ER/RTC precautions were reviewed with the patient.      Please note: voice recognition software was used to produce this document, and typos may escape  review. Please contact Dr. Sheppard Coil for any needed clarifications.     Follow-up plan: Return in about 6 months (around 04/14/2019) for in office for second/last shingles shot, follow up blood pressure. See me sooner if needed! Marland Kitchen

## 2018-10-12 NOTE — Patient Instructions (Addendum)
General Preventive Care  Most recent routine screening lipids/other labs: ordered today. Cholesterol and Diabetes screening recommended annually for people with hypertension.   Tobacco: don't!   Alcohol: responsible moderation is ok for most adults - if you have concerns about your alcohol intake, please talk to me!   Exercise: as tolerated to reduce risk of cardiovascular disease and diabetes. Strength training will also prevent osteoporosis.   Mental health: if need for mental health care (medicines, counseling, other), or concerns about moods, please let me know!   Sexual health: if need for STD testing, or if concerns with libido/pain problems, please let me know!  Advanced Directive: Living Will and/or Healthcare Power of Attorney recommended for all adults, regardless of age or health.  Vaccines  Flu vaccine: recommended for almost everyone, every fall.   Shingles vaccine: Shingrix recommended after age 66. Starting series today!  Pneumonia vaccines: Prevnar and Pneumovax not needed yet.   Tetanus booster: Tdap recommended every 10 years. Let me know when last one was!  Cancer screenings   Colon cancer screening: recommended for everyone age 46-75 - not due this year.   Breast cancer screening: mammogram annually after age 46. Ordered today.   Cervical cancer screening: Pap every 1 to 5 years depending on age and other risk factors. Can usually stop at age 20 or w/ hysterectomy. I'll message OBGYN re: bleeding w/ the medications. Will let you know if any further workup needed.    Lung cancer screening: not needed for non-smokers  Infection screenings . HIV: recommended screening at least once age 66-65, more often as needed. . Gonorrhea/Chlamydia: screening as needed. . Hepatitis C: recommended for anyone born 70-1966 . TB: certain at-risk populations, or depending on work requirements and/or travel history Other . Bone Density Test: recommended for women at age 69,  sooner depending on risk factors (over 48 and smoker, fracture)

## 2018-10-12 NOTE — Addendum Note (Signed)
Addended by: Maryla Morrow on: 10/12/2018 10:12 AM   Modules accepted: Orders

## 2018-10-13 ENCOUNTER — Ambulatory Visit (INDEPENDENT_AMBULATORY_CARE_PROVIDER_SITE_OTHER): Payer: 59

## 2018-10-13 DIAGNOSIS — N950 Postmenopausal bleeding: Secondary | ICD-10-CM

## 2018-10-14 DIAGNOSIS — I10 Essential (primary) hypertension: Secondary | ICD-10-CM | POA: Diagnosis not present

## 2018-10-14 DIAGNOSIS — K760 Fatty (change of) liver, not elsewhere classified: Secondary | ICD-10-CM | POA: Diagnosis not present

## 2018-10-14 DIAGNOSIS — Z0000 Encounter for general adult medical examination without abnormal findings: Secondary | ICD-10-CM | POA: Diagnosis not present

## 2018-10-14 DIAGNOSIS — K828 Other specified diseases of gallbladder: Secondary | ICD-10-CM | POA: Diagnosis not present

## 2018-10-15 LAB — COMPLETE METABOLIC PANEL WITH GFR
AG Ratio: 1.5 (calc) (ref 1.0–2.5)
ALT: 25 U/L (ref 6–29)
AST: 33 U/L (ref 10–35)
Albumin: 4 g/dL (ref 3.6–5.1)
Alkaline phosphatase (APISO): 67 U/L (ref 37–153)
BUN: 11 mg/dL (ref 7–25)
CO2: 29 mmol/L (ref 20–32)
Calcium: 9.5 mg/dL (ref 8.6–10.4)
Chloride: 103 mmol/L (ref 98–110)
Creat: 0.68 mg/dL (ref 0.50–1.05)
GFR, Est African American: 112 mL/min/{1.73_m2} (ref >=60)
GFR, Est Non African American: 96 mL/min/{1.73_m2} (ref >=60)
Globulin: 2.7 g/dL (calc) (ref 1.9–3.7)
Glucose, Bld: 94 mg/dL (ref 65–99)
Potassium: 3.9 mmol/L (ref 3.5–5.3)
Sodium: 139 mmol/L (ref 135–146)
Total Bilirubin: 1.2 mg/dL (ref 0.2–1.2)
Total Protein: 6.7 g/dL (ref 6.1–8.1)

## 2018-10-15 LAB — LIPID PANEL
Cholesterol: 234 mg/dL — ABNORMAL HIGH (ref <200)
HDL: 56 mg/dL (ref >=50)
LDL Cholesterol (Calc): 148 mg/dL (calc) — ABNORMAL HIGH
Non-HDL Cholesterol (Calc): 178 mg/dL (calc) — ABNORMAL HIGH (ref <130)
Total CHOL/HDL Ratio: 4.2 (calc) (ref <5.0)
Triglycerides: 167 mg/dL — ABNORMAL HIGH (ref <150)

## 2018-10-15 LAB — CBC
HCT: 43.9 % (ref 35.0–45.0)
Hemoglobin: 14.6 g/dL (ref 11.7–15.5)
MCH: 28.6 pg (ref 27.0–33.0)
MCHC: 33.3 g/dL (ref 32.0–36.0)
MCV: 85.9 fL (ref 80.0–100.0)
MPV: 11.1 fL (ref 7.5–12.5)
Platelets: 214 10*3/uL (ref 140–400)
RBC: 5.11 10*6/uL — ABNORMAL HIGH (ref 3.80–5.10)
RDW: 12.8 % (ref 11.0–15.0)
WBC: 8.5 10*3/uL (ref 3.8–10.8)

## 2018-10-15 LAB — TSH: TSH: 1.18 mIU/L (ref 0.40–4.50)

## 2018-11-04 ENCOUNTER — Other Ambulatory Visit: Payer: Self-pay

## 2018-11-04 DIAGNOSIS — I10 Essential (primary) hypertension: Secondary | ICD-10-CM

## 2018-11-04 MED ORDER — LISINOPRIL-HYDROCHLOROTHIAZIDE 20-25 MG PO TABS: 0.5000 | ORAL_TABLET | Freq: Every day | ORAL | 1 refills | Status: DC

## 2018-11-04 MED FILL — LISINOPRIL-HCTZ 20-25 MG TA: 20-25 | 90 days supply | Qty: 45 | Fill #0

## 2018-11-23 ENCOUNTER — Ambulatory Visit (INDEPENDENT_AMBULATORY_CARE_PROVIDER_SITE_OTHER): Payer: 59

## 2018-11-23 ENCOUNTER — Other Ambulatory Visit: Payer: Self-pay

## 2018-11-23 DIAGNOSIS — Z0000 Encounter for general adult medical examination without abnormal findings: Secondary | ICD-10-CM

## 2018-11-23 DIAGNOSIS — Z1239 Encounter for other screening for malignant neoplasm of breast: Secondary | ICD-10-CM | POA: Diagnosis not present

## 2018-11-23 DIAGNOSIS — Z1231 Encounter for screening mammogram for malignant neoplasm of breast: Secondary | ICD-10-CM | POA: Diagnosis not present

## 2018-12-29 DIAGNOSIS — H524 Presbyopia: Secondary | ICD-10-CM | POA: Diagnosis not present

## 2019-02-08 DIAGNOSIS — L603 Nail dystrophy: Secondary | ICD-10-CM | POA: Diagnosis not present

## 2019-02-08 DIAGNOSIS — M79671 Pain in right foot: Secondary | ICD-10-CM | POA: Diagnosis not present

## 2019-02-08 DIAGNOSIS — R609 Edema, unspecified: Secondary | ICD-10-CM | POA: Diagnosis not present

## 2019-02-08 DIAGNOSIS — L608 Other nail disorders: Secondary | ICD-10-CM | POA: Diagnosis not present

## 2019-02-08 DIAGNOSIS — L602 Onychogryphosis: Secondary | ICD-10-CM | POA: Diagnosis not present

## 2019-02-08 DIAGNOSIS — M84374A Stress fracture, right foot, initial encounter for fracture: Secondary | ICD-10-CM | POA: Diagnosis not present

## 2019-02-13 MED FILL — LISINOPRIL-HCTZ 20-25 MG TA: 20-25 | 90 days supply | Qty: 45 | Fill #1

## 2019-02-28 DIAGNOSIS — L602 Onychogryphosis: Secondary | ICD-10-CM | POA: Diagnosis not present

## 2019-02-28 DIAGNOSIS — M84374D Stress fracture, right foot, subsequent encounter for fracture with routine healing: Secondary | ICD-10-CM | POA: Diagnosis not present

## 2019-03-01 MED FILL — HYDROCODON-APAP 5-325: 5-325 | 4 days supply | Qty: 16 | Fill #0

## 2019-03-01 MED FILL — CLINDAMYCIN HCL 300 MG CAPS: 300 | 7 days supply | Qty: 21 | Fill #0

## 2019-04-12 ENCOUNTER — Ambulatory Visit (INDEPENDENT_AMBULATORY_CARE_PROVIDER_SITE_OTHER): Payer: 59 | Admitting: Osteopathic Medicine

## 2019-04-12 ENCOUNTER — Other Ambulatory Visit: Payer: Self-pay

## 2019-04-12 VITALS — Temp 98.4°F | Ht 63.0 in | Wt 191.0 lb

## 2019-04-12 DIAGNOSIS — Z23 Encounter for immunization: Secondary | ICD-10-CM

## 2019-04-12 NOTE — Progress Notes (Signed)
Patient presents to clinic for her second shingles vaccine. She reports no negative side effects from the first vaccine. She tolerated the injection well in her left deltoid with no immediate complications. No other questions at this time.

## 2019-04-14 ENCOUNTER — Ambulatory Visit: Payer: 59

## 2019-05-02 ENCOUNTER — Telehealth: Payer: Self-pay | Admitting: *Deleted

## 2019-05-02 ENCOUNTER — Encounter: Payer: Self-pay | Admitting: Osteopathic Medicine

## 2019-05-02 ENCOUNTER — Telehealth (INDEPENDENT_AMBULATORY_CARE_PROVIDER_SITE_OTHER): Payer: 59 | Admitting: Osteopathic Medicine

## 2019-05-02 ENCOUNTER — Telehealth: Payer: Self-pay | Admitting: Osteopathic Medicine

## 2019-05-02 VITALS — Wt 196.0 lb

## 2019-05-02 DIAGNOSIS — Z0189 Encounter for other specified special examinations: Secondary | ICD-10-CM | POA: Diagnosis not present

## 2019-05-02 DIAGNOSIS — R0602 Shortness of breath: Secondary | ICD-10-CM | POA: Diagnosis not present

## 2019-05-02 DIAGNOSIS — F329 Major depressive disorder, single episode, unspecified: Secondary | ICD-10-CM

## 2019-05-02 DIAGNOSIS — F32A Depression, unspecified: Secondary | ICD-10-CM

## 2019-05-02 MED ORDER — VIIBRYD 40 MG PO TABS: 40.0000 mg | ORAL_TABLET | Freq: Every day | ORAL | 3 refills | Status: DC

## 2019-05-02 MED ORDER — VIIBRYD 10 MG PO TABS: ORAL_TABLET | ORAL | 0 refills | Status: DC

## 2019-05-02 NOTE — Telephone Encounter (Signed)
Left message,re: her new patient appointment.

## 2019-05-02 NOTE — Telephone Encounter (Signed)
-----   Message from Emeterio Reeve, DO sent at 05/02/2019 11:13 AM EST ----- Follow Up Instructions: Return in about 4 weeks (around 05/30/2019) for RECHECK [place] - VIRTUAL VISIT .

## 2019-05-02 NOTE — Progress Notes (Signed)
Virtual Visit via Video (App used: doximity) Note  I connected with      Nicole Osborne on 05/02/19 at 8:48 AM  by a telemedicine application and verified that I am speaking with the correct person using two identifiers.  Patient is at home I am in office   I discussed the limitations of evaluation and management by telemedicine and the availability of in person appointments. The patient expressed understanding and agreed to proceed.  History of Present Illness: Nicole Osborne is a 59 y.o. female who would like to discuss anxiety    Mental health:  Anxiety/stress Weight gain, not exercising Broke her foot (stress fracture) so hasn't been walking  Work has been stressful. 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  Would like to get back on Viibryd   Depression screen [facility] 2/9 05/02/2019 10/12/2018 03/12/2016  Decreased Interest 1 0 0  Down, Depressed, Hopeless 0 0 0  PHQ - 2 Score 1 0 0  Altered sleeping 0 1 -  Tired, decreased energy 3 1 -  Change in appetite 1 1 -  Feeling bad or failure about yourself  0 0 -  Trouble concentrating 0 0 -  Moving slowly or fidgety/restless 0 0 -  Suicidal thoughts 0 0 -  PHQ-9 Score 5 3 -  Difficult doing work/chores Not difficult at all Not difficult at all -   GAD 7 : Generalized Anxiety Score 05/02/2019 10/12/2018  Nervous, Anxious, on Edge 2 1  Control/stop worrying 2 1  Worry too much - different things 1 1  Trouble relaxing 0 0  Restless 0 0  Easily annoyed or irritable 1 1  Afraid - awful might happen 0 0  Total GAD 7 Score 6 4  Anxiety Difficulty Not difficult at all Not difficult at all     Cardiac:   Patient is also requesting referral to Dr. Alvester Chou for "women's cardiac issues" at the suggestion of one of her friends.  She is concerned that she is having some shortness of breath on exertion, thinks that she might need some sort of stress test.  No chest pain on exertion.           Observations/Objective: Wt 196 lb (88.9 kg)   BMI 34.72 kg/m  BP Readings from Last 3 Encounters:  10/12/18 131/85  03/28/18 113/73  09/28/17 105/62   Exam: Normal Speech.  NAD  Lab and Radiology Results No results found for this or any previous visit (from the past 72 hour(s)). No results found.     Assessment and Plan: 59 y.o. female with The primary encounter diagnosis was Short of breath on exertion. Diagnoses of Depression and Patient request for diagnostic testing were also pertinent to this visit.   PDMP not reviewed this encounter. Orders Placed This Encounter  Procedures  . Ambulatory referral to Cardiology    Referral Priority:   Routine    Referral Type:   Consultation    Referral Reason:   Specialty Services Required    Referred to Provider:   Lorretta Harp, MD    Requested Specialty:   Cardiology    Number of Visits Requested:   1   Meds ordered this encounter  Medications  . Vilazodone HCl (VIIBRYD) 10 MG TABS    Sig: One by mouth daily for 7 days then two by mouth daily for 7 days then begin 40mg  formulation.    Dispense:  21 tablet  Refill:  0  . Vilazodone HCl (VIIBRYD) 40 MG TABS    Sig: Take 1 tablet (40 mg total) by mouth daily. Begin after starting regimen.    Dispense:  90 tablet    Refill:  3      Follow Up Instructions: Return in about 4 weeks (around 05/30/2019) for RECHECK [place] - VIRTUAL VISIT .    I discussed the assessment and treatment plan with the patient. The patient was provided an opportunity to ask questions and all were answered. The patient agreed with the plan and demonstrated an understanding of the instructions.   The patient was advised to call back or seek an in-person evaluation if any new concerns, if symptoms worsen or if the condition fails to improve as anticipated.  30 minutes of non-face-to-face time was provided during this  encounter.      . . . . . . . . . . . . . Marland Kitchen                   Historical information moved to improve visibility of documentation.  Past Medical History:  Diagnosis Date  . Anemia   . Colon polyp   . Depression   . Diverticulosis   . Gallbladder sludge   . Hypertension   . Hypertriglyceridemia    Past Surgical History:  Procedure Laterality Date  . BREAST BIOPSY Right   . CESAREAN SECTION     x 3  . DILATION AND CURETTAGE OF UTERUS    . TUBAL LIGATION     Social History   Tobacco Use  . Smoking status: Never Smoker  . Smokeless tobacco: Never Used  Substance Use Topics  . Alcohol use: Yes    Comment: occasional   family history includes Breast cancer in her sister; Colon cancer in her maternal grandmother; Diabetes in her mother; Hypertension in her mother; Leukemia in her sister; Lung cancer in her father and sister.  Medications: Current Outpatient Medications  Medication Sig Dispense Refill  . lisinopril-hydrochlorothiazide (ZESTORETIC) 20-25 MG tablet Take 0.5 tablets by mouth daily. 90 tablet 1  . Vilazodone HCl (VIIBRYD) 10 MG TABS One by mouth daily for 7 days then two by mouth daily for 7 days then begin 40mg  formulation. 21 tablet 0  . Vilazodone HCl (VIIBRYD) 40 MG TABS Take 1 tablet (40 mg total) by mouth daily. Begin after starting regimen. 90 tablet 3   No current facility-administered medications for this visit.   No Known Allergies

## 2019-05-02 NOTE — Telephone Encounter (Signed)
Appointment has been made. No further questions at this time.  

## 2019-05-03 ENCOUNTER — Telehealth: Payer: Self-pay | Admitting: Osteopathic Medicine

## 2019-05-03 NOTE — Telephone Encounter (Signed)
Received fax for PA on Viibryd sent through cover my meds waiting on determination. - CF

## 2019-05-04 ENCOUNTER — Telehealth: Payer: Self-pay

## 2019-05-04 MED ORDER — VIIBRYD STARTER PACK 10 & 20 MG PO KIT: 1.0000 | PACK | ORAL | 0 refills | Status: DC

## 2019-05-04 MED FILL — VIIBRYD 10-20 MG STARTER PA: 10 & 20 | 30 days supply | Qty: 30 | Fill #0

## 2019-05-04 NOTE — Telephone Encounter (Signed)
[facility] called stating that insurance will not cover titrated rx for viibryd. Prior authorization was denied. Pharm Tech mentioned insurance will cover viibryd starter pack. Requesting an updated rx sent to the pharmacy. Pls advise, thanks.

## 2019-05-05 NOTE — Telephone Encounter (Signed)
Received fax from [place] is approved for 2 tablets per day.   PA Reference # [id number] - CF

## 2019-05-18 ENCOUNTER — Ambulatory Visit: Payer: 59 | Admitting: Cardiovascular Disease

## 2019-05-29 MED FILL — LISINOPRIL-HYDROCHLOROTHIAZ: 20-25 | 90 days supply | Qty: 45 | Fill #2

## 2019-05-30 ENCOUNTER — Encounter: Payer: Self-pay | Admitting: Osteopathic Medicine

## 2019-05-30 ENCOUNTER — Other Ambulatory Visit: Payer: Self-pay

## 2019-05-30 ENCOUNTER — Telehealth (INDEPENDENT_AMBULATORY_CARE_PROVIDER_SITE_OTHER): Payer: 59 | Admitting: Osteopathic Medicine

## 2019-05-30 ENCOUNTER — Encounter: Payer: Self-pay | Admitting: Cardiovascular Disease

## 2019-05-30 ENCOUNTER — Ambulatory Visit (INDEPENDENT_AMBULATORY_CARE_PROVIDER_SITE_OTHER): Payer: 59 | Admitting: Cardiovascular Disease

## 2019-05-30 VITALS — Wt 195.5 lb

## 2019-05-30 DIAGNOSIS — F419 Anxiety disorder, unspecified: Secondary | ICD-10-CM | POA: Diagnosis not present

## 2019-05-30 DIAGNOSIS — F329 Major depressive disorder, single episode, unspecified: Secondary | ICD-10-CM

## 2019-05-30 DIAGNOSIS — I10 Essential (primary) hypertension: Secondary | ICD-10-CM | POA: Diagnosis not present

## 2019-05-30 DIAGNOSIS — R072 Precordial pain: Secondary | ICD-10-CM | POA: Diagnosis not present

## 2019-05-30 DIAGNOSIS — E782 Mixed hyperlipidemia: Secondary | ICD-10-CM

## 2019-05-30 DIAGNOSIS — R0602 Shortness of breath: Secondary | ICD-10-CM | POA: Diagnosis not present

## 2019-05-30 MED ORDER — VIIBRYD 20 MG PO TABS: 20.0000 mg | ORAL_TABLET | Freq: Every day | ORAL | 1 refills | Status: DC

## 2019-05-30 MED ORDER — ATORVASTATIN CALCIUM 20 MG PO TABS: 20.0000 mg | ORAL_TABLET | Freq: Every day | ORAL | 3 refills | Status: DC

## 2019-05-30 MED FILL — VIIBRYD 20 MG TABLET: 20 | 90 days supply | Qty: 90 | Fill #0

## 2019-05-30 MED FILL — ATORVASTATIN 20 MG TABLET: 20 | 90 days supply | Qty: 90 | Fill #0

## 2019-05-30 NOTE — Progress Notes (Signed)
Virtual Visit via Video (App used: Doximity) Note  I connected with      [name] on 05/30/19 at 8:05 AM  by a telemedicine application and verified that I am speaking with the correct person using two identifiers.  Patient is at home I am in office   I discussed the limitations of evaluation and management by telemedicine and the availability of in person appointments. The patient expressed understanding and agreed to proceed.  History of Present Illness: Nicole Osborne is a 59 y.o. female who would like to discuss mental health follow-up   Last visit a month ago increased anxiety/stress, weight gain, difficulty coping with stressful work situation and working virtually. We restarted Viibryd with titration up from 10 to 20 to 40 mg. Feeling tired all the time but thinks might have to do with weather, lack of activity. Shes on the 20 mg Viibryd dose now.    Depression screen [facility] 2/9 05/30/2019 05/02/2019 10/12/2018  Decreased Interest 1 1 0  Down, Depressed, Hopeless 0 0 0  PHQ - 2 Score 1 1 0  Altered sleeping 1 0 1  Tired, decreased energy 3 3 1   Change in appetite 0 1 1  Feeling bad or failure about yourself  0 0 0  Trouble concentrating 0 0 0  Moving slowly or fidgety/restless 0 0 0  Suicidal thoughts 0 0 0  PHQ-9 Score 5 5 3   Difficult doing work/chores Not difficult at all Not difficult at all Not difficult at all   GAD 7 : Generalized Anxiety Score 05/30/2019 05/02/2019 10/12/2018  Nervous, Anxious, on Edge 1 2 1   Control/stop worrying 0 2 1  Worry too much - different things 1 1 1   Trouble relaxing 0 0 0  Restless 0 0 0  Easily annoyed or irritable 1 1 1   Afraid - awful might happen 0 0 0  Total GAD 7 Score 3 6 4   Anxiety Difficulty Not difficult at all Not difficult at all Not difficult at all       Observations/Objective: Wt 195 lb 8 oz (88.7 kg)   BMI 34.63 kg/m  BP Readings from Last 3 Encounters:  10/12/18 131/85  03/28/18 113/73  09/28/17  105/62   Exam: Normal Speech.  NAD  Lab and Radiology Results No results found for this or any previous visit (from the past 72 hour(s)). No results found.     Assessment and Plan: 59 y.o. female with The encounter diagnosis was Anxiety and depression.  Anxiety/mood improved Pt would like to stay on this Rx a bit longer, I think that's reasonable Let's continue the 20 mg for now MyChart reminder in 2 weeks to touch base OK to take in PM instead of AM  MyChart reminder to f/u virtual visit in 6-8 weeks Pt aware to call us sooner if needed!     Follow Up Instructions: Return in about 6 weeks (around 07/11/2019) for RECHECK ON NEW MEDICATION, VIRTUAL VISIT.    I discussed the assessment and treatment plan with the patient. The patient was provided an opportunity to ask questions and all were answered. The patient agreed with the plan and demonstrated an understanding of the instructions.   The patient was advised to call back or seek an in-person evaluation if any new concerns, if symptoms worsen or if the condition fails to improve as anticipated.  20 minutes of non-face-to-face time was provided during this encounter.      . . . . . . . . . . . . . Marland Kitchen  Historical information moved to improve visibility of documentation.  Past Medical History:  Diagnosis Date  . Anemia   . Colon polyp   . Depression   . Diverticulosis   . Gallbladder sludge   . Hypertension   . Hypertriglyceridemia    Past Surgical History:  Procedure Laterality Date  . BREAST BIOPSY Right   . CESAREAN SECTION     x 3  . DILATION AND CURETTAGE OF UTERUS    . TUBAL LIGATION     Social History   Tobacco Use  . Smoking status: Never Smoker  . Smokeless tobacco: Never Used  Substance Use Topics  . Alcohol use: Yes    Comment: occasional   family history includes Breast cancer in her sister; Colon cancer in her maternal grandmother; Diabetes in  her mother; Hypertension in her mother; Leukemia in her sister; Lung cancer in her father and sister.  Medications: Current Outpatient Medications  Medication Sig Dispense Refill  . CALCIUM PO Take by mouth.    Marland Kitchen lisinopril-hydrochlorothiazide (ZESTORETIC) 20-25 MG tablet Take 0.5 tablets by mouth daily. 90 tablet 1  . Prenatal Vit-Fe Fumarate-FA (M-VIT PO) Take by mouth.    Marland Kitchen VITAMIN D PO Take by mouth.     No current facility-administered medications for this visit.   No Known Allergies

## 2019-05-30 NOTE — Assessment & Plan Note (Signed)
History of hyperlipidemia currently not on statin therapy with lipid profile performed 10/14/2018 revealing total cholesterol 234, LDL 148 and HDL 56.  I am going to get her on atorvastatin 20 mg a day and co-Q10 200 mg a day.  We will recheck a lipid liver profile in 2 months

## 2019-05-30 NOTE — Assessment & Plan Note (Signed)
History of essential potential blood pressure measured today 108/66.  She is on lisinopril and hydrochlorothiazide.

## 2019-05-30 NOTE — Patient Instructions (Addendum)
Medication Instructions:  Start Atorvastatin 20mg  Daily  Start taking 200mg  CoQ10 daily  If you need a refill on your cardiac medications before your next appointment, please call your pharmacy.   Lab work: Fasting Lipids and Hepatic Function in 3 months prior to visit If you have labs (blood work) drawn today and your tests are completely normal, you will receive your results only by: [place] (if you have MyChart) OR A paper copy in the mail If you have any lab test that is abnormal or we need to change your treatment, we will call you to review the results.  Testing/Procedures: Your physician has requested that you have an echocardiogram. Echocardiography is a painless test that uses sound waves to create images of your heart. It provides your doctor with information about the size and shape of your heart and how well your heart's chambers and valves are working. This procedure takes approximately one hour. There are no restrictions for this procedure. [place] [address]  AND  Coronary Calcium Score  Follow-Up: At [facility], you and your health needs are our priority.  As part of our continuing mission to provide you with exceptional heart care, we have created designated Provider Care Teams.  These Care Teams include your primary Cardiologist (physician) and Advanced Practice Providers (APPs -  Physician Assistants and Nurse Practitioners) who all work together to provide you with the care you need, when you need it. You may see Dr. Gwenlyn Found or one of the following Advanced Practice Providers on your designated Care Team:    Kerin Ransom, PA-C  [place], [place]  Coletta Memos, [place]  Your physician wants you to follow-up in: 3 months with Dr. Gwenlyn Found  Any Other Special Instructions Will Be Listed Below (If Applicable).   Coronary Calcium Scan A coronary calcium scan is an imaging test used to look for deposits of plaque in the inner lining of the blood  vessels of the heart (coronary arteries). Plaque is made up of calcium, protein, and fatty substances. These deposits of plaque can partly clog and narrow the coronary arteries without producing any symptoms or warning signs. This puts a person at risk for a heart attack. This test is recommended for people who are at moderate risk for heart disease. The test can find plaque deposits before symptoms develop. Tell a health care provider about:  Any allergies you have.  All medicines you are taking, including vitamins, herbs, eye drops, creams, and over-the-counter medicines.  Any problems you or family members have had with anesthetic medicines.  Any blood disorders you have.  Any surgeries you have had.  Any medical conditions you have.  Whether you are pregnant or may be pregnant. What are the risks? Generally, this is a safe procedure. However, problems may occur, including:  Harm to a pregnant woman and her unborn baby. This test involves the use of radiation. Radiation exposure can be dangerous to a pregnant woman and her unborn baby. If you are pregnant or think you may be pregnant, you should not have this procedure done.  Slight increase in the risk of cancer. This is because of the radiation involved in the test. What happens before the procedure? Ask your health care provider for any specific instructions on how to prepare for this procedure. You may be asked to avoid products that contain caffeine, tobacco, or nicotine for 4 hours before the procedure. What happens during the procedure?   You will undress and remove any  jewelry from your neck or chest.  You will put on a hospital gown.  Sticky electrodes will be placed on your chest. The electrodes will be connected to an electrocardiogram (ECG) machine to record a tracing of the electrical activity of your heart.  You will lie down on a curved bed that is attached to the [place].  You may be given medicine to slow  down your heart rate so that clear pictures can be created.  You will be moved into the CT scanner, and the CT scanner will take pictures of your heart. During this time, you will be asked to lie still and hold your breath for 2-3 seconds at a time while each picture of your heart is being taken. The procedure may vary among health care providers and hospitals. What happens after the procedure?  You can get dressed.  You can return to your normal activities.  It is up to you to get the results of your procedure. Ask your health care provider, or the department that is doing the procedure, when your results will be ready. Summary  A coronary calcium scan is an imaging test used to look for deposits of plaque in the inner lining of the blood vessels of the heart (coronary arteries). Plaque is made up of calcium, protein, and fatty substances.  Generally, this is a safe procedure. Tell your health care provider if you are pregnant or may be pregnant.  Ask your health care provider for any specific instructions on how to prepare for this procedure.  A CT scanner will take pictures of your heart.  You can return to your normal activities after the scan is done. This information is not intended to replace advice given to you by your health care provider. Make sure you discuss any questions you have with your health care provider. Document Revised: 10/04/2018 Document Reviewed: 10/04/2018 Elsevier Patient Education  [place].

## 2019-05-30 NOTE — Assessment & Plan Note (Signed)
History of atypical chest pain occurring several times a month with left upper extremity radiation.  She thinks this is anxiety related.  She did have a negative GXT 04/17/2016 exercising 9 minutes.  I am going to get a coronary calcium score to further evaluate

## 2019-05-30 NOTE — Assessment & Plan Note (Signed)
Weeks history of episodic shortness of breath without prior history of tobacco abuse.  She feels this is anxiety related.  She does have a normal 2D echo which was performed 01/23/2016.  I will repeat this

## 2019-05-30 NOTE — Progress Notes (Signed)
05/30/2019 Nicole Osborne   1960/09/11  QN:[id number]  Primary Physician Emeterio Reeve, DO Primary Cardiologist: Lorretta Harp MD [name], [place]  HPI:  Nicole Osborne is a 59 y.o. moderately overweight married Caucasian female mother of 29, grandmother and 3 grandchildren referred by Dr. Sheppard Coil for evaluation of atypical chest pain and shortness of breath.  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  She has been here [facility] for last 6 years relocating from [place].  She is under a lot of stress in her current professional capacity on top of additional stress caused by COVID-19.  Her cardiac risk factor profile is notable for treated hypertension hyperlipidemia.  There is no family history for heart disease.  She is never had a heart attack or stroke.  She gets occasional chest pressure with left upper extremity radiation occurring several times a month lasting minutes at a time.  She also gets episodic shortness of breath.  She had negative GXT 04/17/2016 and normal 2D echo 01/22/2016.   Current Meds  Medication Sig  . CALCIUM PO Take by mouth.  Marland Kitchen lisinopril-hydrochlorothiazide (ZESTORETIC) 20-25 MG tablet Take 0.5 tablets by mouth daily.  . Multiple Vitamin (MULTIVITAMIN) tablet Take 1 tablet by mouth daily.  . Prenatal Vit-Fe Fumarate-FA (M-VIT PO) Take by mouth.  . Vilazodone HCl (VIIBRYD) 20 MG TABS Take 1 tablet (20 mg total) by mouth daily.  Marland Kitchen VITAMIN D PO Take by mouth.     No Known Allergies  Social History   Socioeconomic History  . Marital status: Married    Spouse name: Not on file  . Number of children: 3  . Years of education: Not on file  . Highest education level: Not on file  Occupational History  . Occupation: Therapist, sports  Tobacco Use  . Smoking status: Never Smoker  . Smokeless tobacco: Never Used  Substance and Sexual Activity  . Alcohol use: Yes    Comment: occasional  . Drug use: No  .  Sexual activity: Not on file  Other Topics Concern  . Not on file  Social History Narrative  . Not on file   Social Determinants of Health   Financial Resource Strain:   . Difficulty of Paying Living Expenses: Not on file  Food Insecurity:   . Worried About Charity fundraiser in the Last Year: Not on file  . Ran Out of Food in the Last Year: Not on file  Transportation Needs:   . Lack of Transportation (Medical): Not on file  . Lack of Transportation (Non-Medical): Not on file  Physical Activity:   . Days of Exercise per Week: Not on file  . Minutes of Exercise per Session: Not on file  Stress:   . Feeling of Stress : Not on file  Social Connections:   . Frequency of Communication with Friends and Family: Not on file  . Frequency of Social Gatherings with Friends and Family: Not on file  . Attends Religious Services: Not on file  . Active Member of Clubs or Organizations: Not on file  . Attends Archivist Meetings: Not on file  . Marital Status: Not on file  Intimate Partner Violence:   . Fear of Current or Ex-Partner: Not on file  . Emotionally Abused: Not on file  . Physically Abused: Not on file  . Sexually Abused: Not on file     Review of Systems: General: negative for chills, fever, night  sweats or weight changes.  Cardiovascular: negative for chest pain, dyspnea on exertion, edema, orthopnea, palpitations, paroxysmal nocturnal dyspnea or shortness of breath Dermatological: negative for rash Respiratory: negative for cough or wheezing Urologic: negative for hematuria Abdominal: negative for nausea, vomiting, diarrhea, bright red blood per rectum, melena, or hematemesis Neurologic: negative for visual changes, syncope, or dizziness All other systems reviewed and are otherwise negative except as noted above.    Blood pressure 108/66, pulse 68, temperature 97.6 F (36.4 C), height 5\' 4"  (1.626 m), weight 196 lb (88.9 kg).  General appearance: alert and  no distress Neck: no adenopathy, no carotid bruit, no JVD, supple, symmetrical, trachea midline and thyroid not enlarged, symmetric, no tenderness/mass/nodules Lungs: clear to auscultation bilaterally Heart: regular rate and rhythm, S1, S2 normal, no murmur, click, rub or gallop Extremities: extremities normal, atraumatic, no cyanosis or edema Pulses: 2+ and symmetric Skin: Skin color, texture, turgor normal. No rashes or lesions Neurologic: Alert and oriented X 3, normal strength and tone. Normal symmetric reflexes. Normal coordination and gait  EKG sinus rhythm at 68 without ST or T wave changes.  Personally reviewed reviewed this EKG.  ASSESSMENT AND PLAN:   Hypertension History of essential potential blood pressure measured today 108/66.  She is on lisinopril and hydrochlorothiazide.  Hyperlipidemia History of hyperlipidemia currently not on statin therapy with lipid profile performed 10/14/2018 revealing total cholesterol 234, LDL 148 and HDL 56.  I am going to get her on atorvastatin 20 mg a day and co-Q10 200 mg a day.  We will recheck a lipid liver profile in 2 months  Chest pain History of atypical chest pain occurring several times a month with left upper extremity radiation.  She thinks this is anxiety related.  She did have a negative GXT 04/17/2016 exercising 9 minutes.  I am going to get a coronary calcium score to further evaluate  Shortness of breath Weeks history of episodic shortness of breath without prior history of tobacco abuse.  She feels this is anxiety related.  She does have a normal 2D echo which was performed 01/23/2016.  I will repeat this      Lorretta Harp MD [facility], [facility] 05/30/2019 1:34 PM

## 2019-06-16 ENCOUNTER — Ambulatory Visit (INDEPENDENT_AMBULATORY_CARE_PROVIDER_SITE_OTHER)
Admission: RE | Admit: 2019-06-16 | Discharge: 2019-06-16 | Disposition: A | Discharge status: Home / Self Care | Payer: Self-pay | Source: Ambulatory Visit | Attending: Cardiovascular Disease | Admitting: Cardiovascular Disease

## 2019-06-16 ENCOUNTER — Ambulatory Visit (HOSPITAL_COMMUNITY): Payer: 59 | Attending: Cardiovascular Disease

## 2019-06-16 ENCOUNTER — Other Ambulatory Visit: Payer: Self-pay

## 2019-06-16 DIAGNOSIS — R918 Other nonspecific abnormal finding of lung field: Secondary | ICD-10-CM | POA: Diagnosis not present

## 2019-06-16 DIAGNOSIS — E782 Mixed hyperlipidemia: Secondary | ICD-10-CM

## 2019-06-16 DIAGNOSIS — I10 Essential (primary) hypertension: Secondary | ICD-10-CM | POA: Diagnosis not present

## 2019-06-16 DIAGNOSIS — R072 Precordial pain: Secondary | ICD-10-CM

## 2019-06-16 DIAGNOSIS — R0602 Shortness of breath: Secondary | ICD-10-CM

## 2019-08-22 ENCOUNTER — Other Ambulatory Visit: Payer: Self-pay | Admitting: Cardiovascular Disease

## 2019-08-22 ENCOUNTER — Encounter: Payer: Self-pay | Admitting: Cardiovascular Disease

## 2019-08-22 DIAGNOSIS — R0602 Shortness of breath: Secondary | ICD-10-CM | POA: Diagnosis not present

## 2019-08-22 DIAGNOSIS — R072 Precordial pain: Secondary | ICD-10-CM | POA: Diagnosis not present

## 2019-08-22 DIAGNOSIS — E782 Mixed hyperlipidemia: Secondary | ICD-10-CM | POA: Diagnosis not present

## 2019-08-22 DIAGNOSIS — I10 Essential (primary) hypertension: Secondary | ICD-10-CM | POA: Diagnosis not present

## 2019-08-30 ENCOUNTER — Other Ambulatory Visit: Payer: Self-pay | Admitting: Cardiovascular Disease

## 2019-08-30 ENCOUNTER — Encounter: Payer: Self-pay | Admitting: Cardiovascular Disease

## 2019-08-30 ENCOUNTER — Ambulatory Visit: Payer: 59 | Admitting: Cardiovascular Disease

## 2019-08-30 ENCOUNTER — Other Ambulatory Visit: Payer: Self-pay

## 2019-08-30 DIAGNOSIS — I10 Essential (primary) hypertension: Secondary | ICD-10-CM | POA: Diagnosis not present

## 2019-08-30 DIAGNOSIS — E782 Mixed hyperlipidemia: Secondary | ICD-10-CM

## 2019-08-30 DIAGNOSIS — R079 Chest pain, unspecified: Secondary | ICD-10-CM

## 2019-08-30 MED ORDER — ATORVASTATIN CALCIUM 20 MG PO TABS: 20.0000 mg | ORAL_TABLET | Freq: Every day | ORAL | 3 refills | Status: DC

## 2019-08-30 MED FILL — ATORVASTATIN 20 MG TABLET: 20 | 90 days supply | Qty: 90 | Fill #0

## 2019-08-30 NOTE — Assessment & Plan Note (Signed)
History of essential hypertension a blood pressure measured today at 110/78.  She is on lisinopril and hydrochlorothiazide.

## 2019-08-30 NOTE — Progress Notes (Signed)
08/30/2019 Nicole Osborne   06/23/1960  [id number]  Primary Physician Emeterio Reeve, DO Primary Cardiologist: Lorretta Harp MD Nicole Osborne, [place]  HPI:  Nicole Osborne is a 59 y.o.  moderately overweight married Caucasian female mother of 83, grandmother and 3 grandchildren referred by Dr. Sheppard Coil for evaluation of atypical chest pain and shortness of breath.  I last saw her in the office 05/30/2019. 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  She has been here [facility] for last 6 years relocating from [place].  She is under a lot of stress in her current professional capacity on top of additional stress caused by COVID-19.  Her cardiac risk factor profile is notable for treated hypertension hyperlipidemia.  There is no family history for heart disease.  She is never had a heart attack or stroke.  She gets occasional chest pressure with left upper extremity radiation occurring several times a month lasting minutes at a time.  She also gets episodic shortness of breath.  She had negative GXT 04/17/2016 and normal 2D echo 01/22/2016.  Since I saw her 3 months ago her chest pain is gotten significantly better.  Her 2D echo was normal and a coronary calcium score was 2 with no evidence of CAD.  To begin her on atorvastatin 20 mg a day for hyperlipidemia and she had repeat labs done last week which we are in the process of tracking down.    Current Meds  Medication Sig  . atorvastatin (LIPITOR) 20 MG tablet Take 1 tablet (20 mg total) by mouth daily.  Marland Kitchen CALCIUM PO Take by mouth.  Marland Kitchen lisinopril-hydrochlorothiazide (ZESTORETIC) 20-25 MG tablet Take 0.5 tablets by mouth daily.  . Multiple Vitamin (MULTIVITAMIN) tablet Take 1 tablet by mouth daily.  . Prenatal Vit-Fe Fumarate-FA (M-VIT PO) Take by mouth.  . Vilazodone HCl (VIIBRYD) 20 MG TABS Take 1 tablet (20 mg total) by mouth daily.  Marland Kitchen VITAMIN D PO Take by mouth.  .  [DISCONTINUED] atorvastatin (LIPITOR) 20 MG tablet Take 1 tablet (20 mg total) by mouth daily.     No Known Allergies  Social History   Socioeconomic History  . Marital status: Married    Spouse name: Not on file  . Number of children: 3  . Years of education: Not on file  . Highest education level: Not on file  Occupational History  . Occupation: Therapist, sports  Tobacco Use  . Smoking status: Never Smoker  . Smokeless tobacco: Never Used  Substance and Sexual Activity  . Alcohol use: Yes    Comment: occasional  . Drug use: No  . Sexual activity: Not on file  Other Topics Concern  . Not on file  Social History Narrative  . Not on file   Social Determinants of Health   Financial Resource Strain:   . Difficulty of Paying Living Expenses:   Food Insecurity:   . Worried About Charity fundraiser in the Last Year:   . Arboriculturist in the Last Year:   Transportation Needs:   . Film/video editor (Medical):   Marland Kitchen Lack of Transportation (Non-Medical):   Physical Activity:   . Days of Exercise per Week:   . Minutes of Exercise per Session:   Stress:   . Feeling of Stress :   Social Connections:   . Frequency of Communication with Friends and Family:   . Frequency of Social Gatherings with Friends and Family:   .  Attends Religious Services:   . Active Member of Clubs or Organizations:   . Attends Archivist Meetings:   Marland Kitchen Marital Status:   Intimate Partner Violence:   . Fear of Current or Ex-Partner:   . Emotionally Abused:   Marland Kitchen Physically Abused:   . Sexually Abused:      Review of Systems: General: negative for chills, fever, night sweats or weight changes.  Cardiovascular: negative for chest pain, dyspnea on exertion, edema, orthopnea, palpitations, paroxysmal nocturnal dyspnea or shortness of breath Dermatological: negative for rash Respiratory: negative for cough or wheezing Urologic: negative for hematuria Abdominal: negative for nausea, vomiting, diarrhea,  bright red blood per rectum, melena, or hematemesis Neurologic: negative for visual changes, syncope, or dizziness All other systems reviewed and are otherwise negative except as noted above.    Blood pressure 110/78, pulse 64, height 5\' 4"  (1.626 m), weight 186 lb (84.4 kg), SpO2 96 %.  General appearance: alert and no distress Neck: no adenopathy, no carotid bruit, no JVD, supple, symmetrical, trachea midline and thyroid not enlarged, symmetric, no tenderness/mass/nodules Lungs: clear to auscultation bilaterally Heart: regular rate and rhythm, S1, S2 normal, no murmur, click, rub or gallop Extremities: extremities normal, atraumatic, no cyanosis or edema Pulses: 2+ and symmetric Skin: Skin color, texture, turgor normal. No rashes or lesions Neurologic: Alert and oriented X 3, normal strength and tone. Normal symmetric reflexes. Normal coordination and gait  EKG not performed today  ASSESSMENT AND PLAN:   Hypertension History of essential hypertension a blood pressure measured today at 110/78.  She is on lisinopril and hydrochlorothiazide.  Hyperlipidemia History of hyperlipidemia recently started on atorvastatin 20 mg a day and had repeat labs done last week which we will track down.  Chest pain History of atypical chest pain probably stress related with a recent 2D echo that was normal and a coronary calcium score that was 2 with no evidence of CAD.  Her pain has improved since I saw her last.      Lorretta Harp MD [facility], [facility] 08/30/2019 10:37 AM

## 2019-08-30 NOTE — Patient Instructions (Signed)
Medication Instructions:  Your physician recommends that you continue on your current medications as directed. Please refer to the Current Medication list given to you today.  *If you need a refill on your cardiac medications before your next appointment, please call your pharmacy*   Follow-Up: At [facility], you and your health needs are our priority.  As part of our continuing mission to provide you with exceptional heart care, we have created designated Provider Care Teams.  These Care Teams include your primary Cardiologist (physician) and Advanced Practice Providers (APPs -  Physician Assistants and Nurse Practitioners) who all work together to provide you with the care you need, when you need it.  We recommend signing up for the patient portal called "MyChart".  Sign up information is provided on this After Visit Summary.  MyChart is used to connect with patients for Virtual Visits (Telemedicine).  Patients are able to view lab/test results, encounter notes, upcoming appointments, etc.  Non-urgent messages can be sent to your provider as well.   To learn more about what you can do with MyChart, go to [email or web address].    Your next appointment:   AS NEEDED with Dr. Berry 

## 2019-08-30 NOTE — Assessment & Plan Note (Signed)
History of atypical chest pain probably stress related with a recent 2D echo that was normal and a coronary calcium score that was 2 with no evidence of CAD.  Her pain has improved since I saw her last.

## 2019-08-30 NOTE — Assessment & Plan Note (Signed)
History of hyperlipidemia recently started on atorvastatin 20 mg a day and had repeat labs done last week which we will track down.

## 2019-08-31 DIAGNOSIS — L821 Other seborrheic keratosis: Secondary | ICD-10-CM | POA: Diagnosis not present

## 2019-08-31 DIAGNOSIS — D225 Melanocytic nevi of trunk: Secondary | ICD-10-CM | POA: Diagnosis not present

## 2019-08-31 DIAGNOSIS — L814 Other melanin hyperpigmentation: Secondary | ICD-10-CM | POA: Diagnosis not present

## 2019-08-31 DIAGNOSIS — L570 Actinic keratosis: Secondary | ICD-10-CM | POA: Diagnosis not present

## 2019-08-31 DIAGNOSIS — L820 Inflamed seborrheic keratosis: Secondary | ICD-10-CM | POA: Diagnosis not present

## 2019-08-31 DIAGNOSIS — D2239 Melanocytic nevi of other parts of face: Secondary | ICD-10-CM | POA: Diagnosis not present

## 2019-08-31 MED FILL — LISINOPRIL-HYDROCHLOROTHIAZ: 20-25 | 90 days supply | Qty: 45 | Fill #3

## 2019-09-07 ENCOUNTER — Other Ambulatory Visit: Payer: Self-pay | Admitting: Nurse Practitioner

## 2019-09-07 ENCOUNTER — Encounter: Payer: Self-pay | Admitting: Nurse Practitioner

## 2019-09-07 ENCOUNTER — Other Ambulatory Visit: Payer: Self-pay

## 2019-09-07 ENCOUNTER — Ambulatory Visit (INDEPENDENT_AMBULATORY_CARE_PROVIDER_SITE_OTHER): Payer: 59 | Admitting: Nurse Practitioner

## 2019-09-07 VITALS — BP 113/78 | HR 71 | Temp 98.2°F | Ht 64.0 in | Wt 185.9 lb

## 2019-09-07 DIAGNOSIS — R0789 Other chest pain: Secondary | ICD-10-CM | POA: Diagnosis not present

## 2019-09-07 DIAGNOSIS — I10 Essential (primary) hypertension: Secondary | ICD-10-CM

## 2019-09-07 DIAGNOSIS — E782 Mixed hyperlipidemia: Secondary | ICD-10-CM

## 2019-09-07 DIAGNOSIS — F324 Major depressive disorder, single episode, in partial remission: Secondary | ICD-10-CM | POA: Diagnosis not present

## 2019-09-07 DIAGNOSIS — R0602 Shortness of breath: Secondary | ICD-10-CM

## 2019-09-07 LAB — COMPLETE METABOLIC PANEL WITHOUT GFR
AG Ratio: 1.9 (calc) (ref 1.0–2.5)
ALT: 22 U/L (ref 6–29)
AST: 31 U/L (ref 10–35)
Albumin: 4.5 g/dL (ref 3.6–5.1)
Alkaline phosphatase (APISO): 89 U/L (ref 37–153)
BUN: 14 mg/dL (ref 7–25)
CO2: 31 mmol/L (ref 20–32)
Calcium: 10.3 mg/dL (ref 8.6–10.4)
Chloride: 102 mmol/L (ref 98–110)
Creat: 0.89 mg/dL (ref 0.50–1.05)
GFR, Est African American: 82 mL/min/1.73m2 (ref >=60)
GFR, Est Non African American: 71 mL/min/1.73m2 (ref >=60)
Globulin: 2.4 g/dL (ref 1.9–3.7)
Glucose, Bld: 100 mg/dL — ABNORMAL HIGH (ref 65–99)
Potassium: 4.6 mmol/L (ref 3.5–5.3)
Sodium: 140 mmol/L (ref 135–146)
Total Bilirubin: 1.6 mg/dL — ABNORMAL HIGH (ref 0.2–1.2)
Total Protein: 6.9 g/dL (ref 6.1–8.1)

## 2019-09-07 MED ORDER — VIIBRYD 20 MG PO TABS: 20.0000 mg | ORAL_TABLET | Freq: Every day | ORAL | 1 refills | Status: DC

## 2019-09-07 MED ORDER — LISINOPRIL-HYDROCHLOROTHIAZIDE 20-25 MG PO TABS: 0.5000 | ORAL_TABLET | Freq: Every day | ORAL | 1 refills | Status: DC

## 2019-09-07 NOTE — Patient Instructions (Signed)
Exercising to Stay Healthy To become healthy and stay healthy, it is recommended that you do moderate-intensity and vigorous-intensity exercise. You can tell that you are exercising at a moderate intensity if your heart starts beating faster and you start breathing faster but can still hold a conversation. You can tell that you are exercising at a vigorous intensity if you are breathing much harder and faster and cannot hold a conversation while exercising. Exercising regularly is important. It has many health benefits, such as:  Improving overall fitness, flexibility, and endurance.  Increasing bone density.  Helping with weight control.  Decreasing body fat.  Increasing muscle strength.  Reducing stress and tension.  Improving overall health. How often should I exercise? Choose an activity that you enjoy, and set realistic goals. Your health care provider can help you make an activity plan that works for you. Exercise regularly as told by your health care provider. This may include:  Doing strength training two times a week, such as: ? Lifting weights. ? Using resistance bands. ? Push-ups. ? Sit-ups. ? Yoga.  Doing a certain intensity of exercise for a given amount of time. Choose from these options: ? A total of 150 minutes of moderate-intensity exercise every week. ? A total of 75 minutes of vigorous-intensity exercise every week. ? A mix of moderate-intensity and vigorous-intensity exercise every week. Children, pregnant women, people who have not exercised regularly, people who are overweight, and older adults may need to talk with a health care provider about what activities are safe to do. If you have a medical condition, be sure to talk with your health care provider before you start a new exercise program. What are some exercise ideas? Moderate-intensity exercise ideas include:  Walking 1 mile (1.6 km) in about 15  minutes.  Biking.  Hiking.  Golfing.  Dancing.  Water aerobics. Vigorous-intensity exercise ideas include:  Walking 4.5 miles (7.2 km) or more in about 1 hour.  Jogging or running 5 miles (8 km) in about 1 hour.  Biking 10 miles (16.1 km) or more in about 1 hour.  Lap swimming.  Roller-skating or in-line skating.  Cross-country skiing.  Vigorous competitive sports, such as football, basketball, and soccer.  Jumping rope.  Aerobic dancing. What are some everyday activities that can help me to get exercise?  Yard work, such as: ? Pushing a lawn mower. ? Raking and bagging leaves.  Washing your car.  Pushing a stroller.  Shoveling snow.  Gardening.  Washing windows or floors. How can I be more active in my day-to-day activities?  Use stairs instead of an elevator.  Take a walk during your lunch break.  If you drive, park your car farther away from your work or school.  If you take public transportation, get off one stop [place] and walk the rest of the way.  Stand up or walk around during all of your indoor phone calls.  Get up, stretch, and walk around every 30 minutes throughout the day.  Enjoy exercise with a friend. Support to continue exercising will help you keep a regular routine of activity. What guidelines can I follow while exercising?  Before you start a new exercise program, talk with your health care provider.  Do not exercise so much that you hurt yourself, feel dizzy, or get very short of breath.  Wear comfortable clothes and wear shoes with good support.  Drink plenty of water while you exercise to prevent dehydration or heat stroke.  Work out until your breathing   and your heartbeat get faster. Where to find more information  [place] Department of Health and Human Services: [employer]  Centers for Disease Control and Prevention (CDC): http://www.wolf.info/ Summary  Exercising regularly is important. It will improve your overall fitness,  flexibility, and endurance.  Regular exercise also will improve your overall health. It can help you control your weight, reduce stress, and improve your bone density.  Do not exercise so much that you hurt yourself, feel dizzy, or get very short of breath.  Before you start a new exercise program, talk with your health care provider. This information is not intended to replace advice given to you by your health care provider. Make sure you discuss any questions you have with your health care provider. Document Revised: 02/26/2017 Document Reviewed: 02/04/2017 Elsevier Patient Education  [place].   High Cholesterol  High cholesterol is a condition in which the blood has high levels of a white, waxy, fat-like substance (cholesterol). The human body needs small amounts of cholesterol. The liver makes all the cholesterol that the body needs. Extra (excess) cholesterol comes from the food that we eat. Cholesterol is carried from the liver by the blood through the blood vessels. If you have high cholesterol, deposits (plaques) may build up on the walls of your blood vessels (arteries). Plaques make the arteries narrower and stiffer. Cholesterol plaques increase your risk for heart attack and stroke. Work with your health care provider to keep your cholesterol levels in a healthy range. What increases the risk? This condition is more likely to develop in people who:  Eat foods that are high in animal fat (saturated fat) or cholesterol.  Are overweight.  Are not getting enough exercise.  Have a family history of high cholesterol. What are the signs or symptoms? There are no symptoms of this condition. How is this diagnosed? This condition may be diagnosed from the results of a blood test.  If you are older than age 60, your health care provider may check your cholesterol every 4-6 years.  You may be checked more often if you already have high cholesterol or other risk factors for  heart disease. The blood test for cholesterol measures:  "Bad" cholesterol (LDL cholesterol). This is the main type of cholesterol that causes heart disease. The desired level for LDL is less than 100.  "Good" cholesterol (HDL cholesterol). This type helps to protect against heart disease by cleaning the arteries and carrying the LDL away. The desired level for HDL is 60 or higher.  Triglycerides. These are fats that the body can store or burn for energy. The desired number for triglycerides is lower than 150.  Total cholesterol. This is a measure of the total amount of cholesterol in your blood, including LDL cholesterol, HDL cholesterol, and triglycerides. A healthy number is less than 200. How is this treated? This condition is treated with diet changes, lifestyle changes, and medicines. Diet changes  This may include eating more whole grains, fruits, vegetables, nuts, and fish.  This may also include cutting back on red meat and foods that have a lot of added sugar. Lifestyle changes  Changes may include getting at least 40 minutes of aerobic exercise 3 times a week. Aerobic exercises include walking, biking, and swimming. Aerobic exercise along with a healthy diet can help you maintain a healthy weight.  Changes may also include quitting smoking. Medicines  Medicines are usually given if diet and lifestyle changes have failed to reduce your cholesterol to healthy levels.  Your health care provider may prescribe a statin medicine. Statin medicines have been shown to reduce cholesterol, which can reduce the risk of heart disease. Follow these instructions at home: Eating and drinking If told by your health care provider:  Eat chicken (without skin), fish, veal, shellfish, ground [place] breast, and round or loin cuts of red meat.  Do not eat fried foods or fatty meats, such as hot dogs and salami.  Eat plenty of fruits, such as apples.  Eat plenty of vegetables, such as  broccoli, potatoes, and carrots.  Eat beans, peas, and lentils.  Eat grains such as barley, rice, couscous, and bulgur wheat.  Eat pasta without cream sauces.  Use skim or nonfat milk, and eat low-fat or nonfat yogurt and cheeses.  Do not eat or drink whole milk, cream, ice cream, egg yolks, or hard cheeses.  Do not eat stick margarine or tub margarines that contain trans fats (also called partially hydrogenated oils).  Do not eat saturated tropical oils, such as coconut oil and palm oil.  Do not eat cakes, cookies, crackers, or other baked goods that contain trans fats.  General instructions  Exercise as directed by your health care provider. Increase your activity level with activities such as gardening, walking, and taking the stairs.  Take over-the-counter and prescription medicines only as told by your health care provider.  Do not use any products that contain nicotine or tobacco, such as cigarettes and e-cigarettes. If you need help quitting, ask your health care provider.  Keep all follow-up visits as told by your health care provider. This is important. Contact a health care provider if:  You are struggling to maintain a healthy diet or weight.  You need help to start on an exercise program.  You need help to stop smoking. Get help right away if:  You have chest pain.  You have trouble breathing. This information is not intended to replace advice given to you by your health care provider. Make sure you discuss any questions you have with your health care provider. Document Revised: 03/19/2017 Document Reviewed: 09/14/2015 Elsevier Patient Education  [place].   Hypertension, Adult High blood pressure (hypertension) is when the force of blood pumping through the arteries is too strong. The arteries are the blood vessels that carry blood from the heart throughout the body. Hypertension forces the heart to work harder to pump blood and may cause arteries  to become narrow or stiff. Untreated or uncontrolled hypertension can cause a heart attack, heart failure, a stroke, kidney disease, and other problems. A blood pressure reading consists of a higher number over a lower number. Ideally, your blood pressure should be below 120/80. The first ("top") number is called the systolic pressure. It is a measure of the pressure in your arteries as your heart beats. The second ("bottom") number is called the diastolic pressure. It is a measure of the pressure in your arteries as the heart relaxes. What are the causes? The exact cause of this condition is not known. There are some conditions that result in or are related to high blood pressure. What increases the risk? Some risk factors for high blood pressure are under your control. The following factors may make you more likely to develop this condition:  Smoking.  Having type 2 diabetes mellitus, high cholesterol, or both.  Not getting enough exercise or physical activity.  Being overweight.  Having too much fat, sugar, calories, or salt (sodium) in your diet.  Drinking too  much alcohol. Some risk factors for high blood pressure may be difficult or impossible to change. Some of these factors include:  Having chronic kidney disease.  Having a family history of high blood pressure.  Age. Risk increases with age.  Race. You may be at higher risk if you are African American.  Gender. Men are at higher risk than women before age 82. After age 80, women are at higher risk than men.  Having obstructive sleep apnea.  Stress. What are the signs or symptoms? High blood pressure may not cause symptoms. Very high blood pressure (hypertensive crisis) may cause:  Headache.  Anxiety.  Shortness of breath.  Nosebleed.  Nausea and vomiting.  Vision changes.  Severe chest pain.  Seizures. How is this diagnosed? This condition is diagnosed by measuring your blood pressure while you are seated,  with your arm resting on a flat surface, your legs uncrossed, and your feet flat on the floor. The cuff of the blood pressure monitor will be placed directly against the skin of your upper arm at the level of your heart. It should be measured at least twice using the same arm. Certain conditions can cause a difference in blood pressure between your right and left arms. Certain factors can cause blood pressure readings to be lower or higher than normal for a short period of time:  When your blood pressure is higher when you are in a health care provider's office than when you are at home, this is called white coat hypertension. Most people with this condition do not need medicines.  When your blood pressure is higher at home than when you are in a health care provider's office, this is called masked hypertension. Most people with this condition may need medicines to control blood pressure. If you have a high blood pressure reading during one visit or you have normal blood pressure with other risk factors, you may be asked to:  Return on a different day to have your blood pressure checked again.  Monitor your blood pressure at home for 1 week or longer. If you are diagnosed with hypertension, you may have other blood or imaging tests to help your health care provider understand your overall risk for other conditions. How is this treated? This condition is treated by making healthy lifestyle changes, such as eating healthy foods, exercising more, and reducing your alcohol intake. Your health care provider may prescribe medicine if lifestyle changes are not enough to get your blood pressure under control, and if:  Your systolic blood pressure is above 130.  Your diastolic blood pressure is above 80. Your personal target blood pressure may vary depending on your medical conditions, your age, and other factors. Follow these instructions at home: Eating and drinking   Eat a diet that is high in fiber  and potassium, and low in sodium, added sugar, and fat. An example eating plan is called the DASH (Dietary Approaches to Stop Hypertension) diet. To eat this way: ? Eat plenty of fresh fruits and vegetables. Try to fill one half of your plate at each meal with fruits and vegetables. ? Eat whole grains, such as whole-wheat pasta, brown rice, or whole-grain bread. Fill about one fourth of your plate with whole grains. ? Eat or drink low-fat dairy products, such as skim milk or low-fat yogurt. ? Avoid fatty cuts of meat, processed or cured meats, and poultry with skin. Fill about one fourth of your plate with lean proteins, such as fish, chicken without  skin, beans, eggs, or tofu. ? Avoid pre-made and processed foods. These tend to be higher in sodium, added sugar, and fat.  Reduce your daily sodium intake. Most people with hypertension should eat less than 1,500 mg of sodium a day.  Do not drink alcohol if: ? Your health care provider tells you not to drink. ? You are pregnant, may be pregnant, or are planning to become pregnant.  If you drink alcohol: ? Limit how much you use to:  0-1 drink a day for women.  0-2 drinks a day for men. ? Be aware of how much alcohol is in your drink. In the [place] one drink equals one 12 oz bottle of beer (355 mL), one 5 oz glass of wine (148 mL), or one 1 oz glass of hard liquor (44 mL). Lifestyle   Work with your health care provider to maintain a healthy body weight or to lose weight. Ask what an ideal weight is for you.  Get at least 30 minutes of exercise most days of the week. Activities may include walking, swimming, or biking.  Include exercise to strengthen your muscles (resistance exercise), such as Pilates or lifting weights, as part of your weekly exercise routine. Try to do these types of exercises for 30 minutes at least 3 days a week.  Do not use any products that contain nicotine or tobacco, such as cigarettes, e-cigarettes, and chewing  tobacco. If you need help quitting, ask your health care provider.  Monitor your blood pressure at home as told by your health care provider.  Keep all follow-up visits as told by your health care provider. This is important. Medicines  Take over-the-counter and prescription medicines only as told by your health care provider. Follow directions carefully. Blood pressure medicines must be taken as prescribed.  Do not skip doses of blood pressure medicine. Doing this puts you at risk for problems and can make the medicine less effective.  Ask your health care provider about side effects or reactions to medicines that you should watch for. Contact a health care provider if you:  Think you are having a reaction to a medicine you are taking.  Have headaches that keep coming back (recurring).  Feel dizzy.  Have swelling in your ankles.  Have trouble with your vision. Get help right away if you:  Develop a severe headache or confusion.  Have unusual weakness or numbness.  Feel faint.  Have severe pain in your chest or abdomen.  Vomit repeatedly.  Have trouble breathing. Summary  Hypertension is when the force of blood pumping through your arteries is too strong. If this condition is not controlled, it may put you at risk for serious complications.  Your personal target blood pressure may vary depending on your medical conditions, your age, and other factors. For most people, a normal blood pressure is less than 120/80.  Hypertension is treated with lifestyle changes, medicines, or a combination of both. Lifestyle changes include losing weight, eating a healthy, low-sodium diet, exercising more, and limiting alcohol. This information is not intended to replace advice given to you by your health care provider. Make sure you discuss any questions you have with your health care provider. Document Revised: 11/24/2017 Document Reviewed: 11/24/2017 Elsevier Patient Education  2020  [employer].

## 2019-09-07 NOTE — Progress Notes (Signed)
Established Patient Office Visit  Subjective:  Patient ID: Nicole Osborne, female    DOB: [birth year]  Age: 59 y.o. MRN: [id number]  CC:  Chief Complaint  Patient presents with   Depression   Anxiety    HPI Nicole Osborne presents for follow-up for depression and hypertension and refill of medications.   She reports she is doing well with her current medication regimen.  She reports that she has lost weight lately by doing weight watchers and plans to continue.  DEPRESSION Taking Viibryd 20mg  daily Mood status: controlled Satisfied with current treatment?: yes Symptom severity: mild  Duration of current treatment : months Side effects: no Medication compliance: excellent compliance Depressed mood: no Anxious mood: no Anhedonia: no Significant weight loss or gain: no Insomnia: no  Fatigue: yes, but this is chronic and related to occupation Feelings of worthlessness or guilt: no Impaired concentration/indecisiveness: no Suicidal ideations: no Hopelessness: no Crying spells: no Depression screen [facility] 2/9 09/07/2019 05/30/2019 05/02/2019 10/12/2018 03/12/2016  Decreased Interest 0 1 1 0 0  Down, Depressed, Hopeless 0 0 0 0 0  PHQ - 2 Score 0 1 1 0 0  Altered sleeping 1 1 0 1 -  Tired, decreased energy 1 3 3 1  -  Change in appetite 0 0 1 1 -  Feeling bad or failure about yourself  0 0 0 0 -  Trouble concentrating 0 0 0 0 -  Moving slowly or fidgety/restless 0 0 0 0 -  Suicidal thoughts 0 0 0 0 -  PHQ-9 Score 2 5 5 3  -  Difficult doing work/chores Not difficult at all Not difficult at all Not difficult at all Not difficult at all -   HYPERTENSION Taking Lisinopril-HCTZ 10/12.5mg  daily Hypertension status: controlled  Satisfied with current treatment? yes Duration of hypertension: chronic BP monitoring frequency:  weekly BP range: 110-120/70-80 BP medication side effects:  no Medication compliance: excellent compliance Aspirin: no Recurrent headaches: no Visual  changes: no Palpitations: no Dyspnea: no Chest pain: no Lower extremity edema: no Dizzy/lightheaded: no   CHEST PAIN-SHORTNESS OF BREATH-HYPERLIDEMIA Taking Lipitor 20mg  daily She is seen by Dr. Gwenlyn Found with cardiology for her atypical chest pain, shortness of breath, and hyperlipidemia.  She reports the symptoms have significantly improved over the past few months and she does feel that they were most likely related to anxiety and stress.  At her most recent visit she was started on Lipitor. Her most recent Echocardiogram was negative for acute changes or findings and her coronary calcium score was 2 with no evidence of CAD. She will follow-up with him on an as needed basis for these problems.   She did have labs drawn at [facility] but these have not resulted into the chart.  The 10-year ASCVD risk score Nicole Osborne DC [name]., et al., 2013) is: 3.5%   Values used to calculate the score:     Age: 15 years     Sex: Female     Is Non-Hispanic African American: No     Diabetic: No     Tobacco smoker: No     Systolic Blood Pressure: 539 mmHg     Is BP treated: Yes     HDL Cholesterol: 56 mg/dL     Total Cholesterol: 234 mg/dL   Past Medical History:  Diagnosis Date   Anemia    Colon polyp    Depression    Diverticulosis    Gallbladder sludge    Hypertension    Hypertriglyceridemia  Past Surgical History:  Procedure Laterality Date   BREAST BIOPSY Right    CESAREAN SECTION     x 3   DILATION AND CURETTAGE OF UTERUS     TUBAL LIGATION      Family History  Problem Relation Age of Onset   Hypertension Mother    Diabetes Mother    Lung cancer Father    Breast cancer Sister    Leukemia Sister    Colon cancer Maternal Grandmother    Lung cancer Sister    CAD Neg Hx     Social History   Socioeconomic History   Marital status: Married    Spouse name: Not on file   Number of children: 3   Years of education: Not on file   Highest education  level: Not on file  Occupational History   Occupation: Therapist, sports  Tobacco Use   Smoking status: Never Smoker   Smokeless tobacco: Never Used  Substance and Sexual Activity   Alcohol use: Yes    Comment: occasional   Drug use: No   Sexual activity: Not on file  Other Topics Concern   Not on file  Social History Narrative   Not on file   Social Determinants of Health   Financial Resource Strain:    Difficulty of Paying Living Expenses:   Food Insecurity:    Worried About Charity fundraiser in the Last Year:    Arboriculturist in the Last Year:   Transportation Needs:    Film/video editor (Medical):    Lack of Transportation (Non-Medical):   Physical Activity:    Days of Exercise per Week:    Minutes of Exercise per Session:   Stress:    Feeling of Stress :   Social Connections:    Frequency of Communication with Friends and Family:    Frequency of Social Gatherings with Friends and Family:    Attends Religious Services:    Active Member of Clubs or Organizations:    Attends Music therapist:    Marital Status:   Intimate Partner Violence:    Fear of Current or Ex-Partner:    Emotionally Abused:    Physically Abused:    Sexually Abused:     Outpatient Medications Prior to Visit  Medication Sig Dispense Refill   atorvastatin (LIPITOR) 20 MG tablet Take 1 tablet (20 mg total) by mouth daily. 90 tablet 3   CALCIUM PO Take by mouth.     Multiple Vitamin (MULTIVITAMIN) tablet Take 1 tablet by mouth daily.     Prenatal Vit-Fe Fumarate-FA (M-VIT PO) Take by mouth.     VITAMIN D PO Take by mouth.     lisinopril-hydrochlorothiazide (ZESTORETIC) 20-25 MG tablet Take 0.5 tablets by mouth daily. 90 tablet 1   Vilazodone HCl (VIIBRYD) 20 MG TABS Take 1 tablet (20 mg total) by mouth daily. 90 tablet 1   No facility-administered medications prior to visit.    No Known Allergies  ROS Review of Systems  Constitutional: Negative  for activity change, appetite change, chills, fatigue, fever and unexpected weight change.  Eyes: Negative for visual disturbance.  Respiratory: Negative for cough, chest tightness and shortness of breath.   Cardiovascular: Negative for chest pain, palpitations and leg swelling.  Gastrointestinal: Negative for abdominal distention, constipation, diarrhea, nausea and vomiting.  Neurological: Negative for dizziness, weakness, light-headedness and headaches.  Psychiatric/Behavioral: Negative for behavioral problems, decreased concentration, dysphoric mood, self-injury, sleep disturbance and suicidal ideas. The patient is not  nervous/anxious.       Objective:    Physical Exam Vitals and nursing note reviewed.  Constitutional:      Appearance: Normal appearance.  HENT:     Head: Normocephalic.  Eyes:     Extraocular Movements: Extraocular movements intact.     Conjunctiva/sclera: Conjunctivae normal.     Pupils: Pupils are equal, round, and reactive to light.     Funduscopic exam:    Right eye: No AV nicking or papilledema. Red reflex present.        Left eye: No AV nicking or papilledema. Red reflex present. Neck:     Vascular: No carotid bruit.  Cardiovascular:     Rate and Rhythm: Normal rate and regular rhythm.     Pulses: Normal pulses.     Heart sounds: Normal heart sounds.  Pulmonary:     Effort: Pulmonary effort is normal.     Breath sounds: Normal breath sounds.  Abdominal:     General: Abdomen is flat.     Palpations: Abdomen is soft.  Musculoskeletal:        General: Normal range of motion.     Cervical back: Normal range of motion.     Right lower leg: No edema.     Left lower leg: No edema.  Skin:    General: Skin is warm and dry.     Capillary Refill: Capillary refill takes less than 2 seconds.  Neurological:     General: No focal deficit present.     Mental Status: She is alert and oriented to person, place, and time.  Psychiatric:        Mood and Affect:  Mood normal.        Behavior: Behavior normal.        Thought Content: Thought content normal.        Judgment: Judgment normal.     BP 113/78    Pulse 71    Temp 98.2 F (36.8 C) (Oral)    Ht 5\' 4"  (1.626 m)    Wt 185 lb 14.4 oz (84.3 kg)    SpO2 96%    BMI 31.91 kg/m  Wt Readings from Last 3 Encounters:  09/07/19 185 lb 14.4 oz (84.3 kg)  08/30/19 186 lb (84.4 kg)  05/30/19 196 lb (88.9 kg)     There are no preventive care reminders to display for this patient.  There are no preventive care reminders to display for this patient.  Lab Results  Component Value Date   TSH 1.18 10/14/2018   Lab Results  Component Value Date   WBC 8.5 10/14/2018   HGB 14.6 10/14/2018   HCT 43.9 10/14/2018   MCV 85.9 10/14/2018   PLT 214 10/14/2018   Lab Results  Component Value Date   NA 139 10/14/2018   K 3.9 10/14/2018   CO2 29 10/14/2018   GLUCOSE 94 10/14/2018   BUN 11 10/14/2018   CREATININE 0.68 10/14/2018   BILITOT 1.2 10/14/2018   ALKPHOS 77 09/08/2016   AST 33 10/14/2018   ALT 25 10/14/2018   PROT 6.7 10/14/2018   ALBUMIN 3.8 09/08/2016   CALCIUM 9.5 10/14/2018   ANIONGAP 10 01/22/2016   Lab Results  Component Value Date   CHOL 234 (H) 10/14/2018   Lab Results  Component Value Date   HDL 56 10/14/2018   Lab Results  Component Value Date   LDLCALC 148 (H) 10/14/2018   Lab Results  Component Value Date   TRIG 167 (H)  10/14/2018   Lab Results  Component Value Date   CHOLHDL 4.2 10/14/2018   Lab Results  Component Value Date   HGBA1C 5.7 (H) 03/12/2016      Assessment & Plan:  1. Essential hypertension Taking lisinopril-hydrochlorothiazide daily to manage hypertension.  This is currently well controlled.  She is also lost weight recently which I am sure is significantly helping her overall health and her hypertension.  No changes to medication or the plan of care today.  Will obtain CMP to monitor for electrolyte and kidney function.  PLAN: -Obtain  CMP today -Continue lisinopril-HCTZ daily for blood pressure -Monitor your blood pressure at home at least once a week and report any elevations in blood pressure -Monitor for new or changing symptoms including headache, dizziness, vision changes, chest pain, or weakness. -Follow-up in 1 year or sooner if needed   2. Depression, major, single episode, in partial remission ([place]) Taking Viibryd daily to manage depression. She was started on this medication in February of this year.  Her depression is well controlled at this time with the current medication regimen.  No changes to the medication today.  PLAN: -Continue Viibryd 20 mg/day -Monitor for signs and symptoms of worsening depression or anxiety -Follow-up if symptoms are no longer controlled with current regimen, worsen.  Otherwise plan to follow-up in approximately 1 year.  3. Mixed hyperlipidemia Started on Lipitor 20mg  by Dr. Gwenlyn Found with cardiology last week. We can plan to help manage this with labs and refills as appropriate, if she chooses.  She did have labs performed at [facility] however these have not resulted into the chart.  I will check into this to see if we can have these placed in her chart for evaluation.  PLAN: -Chose a diet low in saturated fat and cholesterol -Continue with current weight loss measures-doing a fantastic job!  Keep up the great work -Exercise to get your heart rate up at least 30 minutes at a time for at least 5 days a week -Continue medication as directed by cardiology.  -Follow-up labs in approximately 12 months to evaluate the effectiveness of the medication and monitor for liver function changes  4. Atypical chest pain Followed by Dr. Gwenlyn Found with cardiology for atypical chest pain. This has improved since she was referred and she is no longer experiencing symptoms. No evidence of structural abnormality or CAD with recent ECHO and Coronary Calcium Score.   PLAN: -Choose a diet low in  saturated fat and cholesterol -Exercise to get your heart rate up at least 30 minutes at a time for at least 5 days a week -Continue with your weight loss plan -Continue medications for cholesterol and blood pressure management -If you begin to have worsening or new symptoms of chest pain, chest pressure, shortness of breath, dizziness, radiating shoulder, neck, jaw, or back pain please seek emergency evaluation immediately.  -Follow-up with cardiology as directed or with this office as needed  5. Shortness of breath Followed by Dr. Gwenlyn Found with cardiology for shortness of breath. This has improved since she was referred. No evidence of structural abnormality or CAD on recent ECHO and Coronary Calcium Score.   PLAN: -Choose a diet low in saturated fat and cholesterol -Exercise to get your heart rate up at least 30 minutes at a time for at least 5 days a week -Continue with your weight loss plan -Continue medications for cholesterol and blood pressure management -If you begin to have worsening or new symptoms of chest pain,  chest pressure, shortness of breath, dizziness, radiating shoulder, neck, jaw, or back pain please seek emergency evaluation immediately.  -Follow-up with cardiology as directed or with this office as needed.   Return in about 1 year (around 09/06/2020) for HTN/HLD/Mood.   Orma Render, NP

## 2019-09-12 LAB — LIPID PANEL
Cholesterol: 157 mg/dL (ref <200)
HDL: 57 mg/dL (ref >=50)
LDL Cholesterol (Calc): 77 mg/dL (calc)
Non-HDL Cholesterol (Calc): 100 mg/dL (calc) (ref <130)
Total CHOL/HDL Ratio: 2.8 (calc) (ref <5.0)
Triglycerides: 126 mg/dL (ref <150)

## 2019-09-12 LAB — HEPATIC FUNCTION PANEL
AG Ratio: 2 (calc) (ref 1.0–2.5)
ALT: 25 U/L (ref 6–29)
AST: 30 U/L (ref 10–35)
Albumin: 4.1 g/dL (ref 3.6–5.1)
Alkaline phosphatase (APISO): 88 U/L (ref 37–153)
Bilirubin, Direct: 0.2 mg/dL (ref 0.0–0.2)
Globulin: 2.1 g/dL (calc) (ref 1.9–3.7)
Indirect Bilirubin: 0.6 mg/dL (calc) (ref 0.2–1.2)
Total Bilirubin: 0.8 mg/dL (ref 0.2–1.2)
Total Protein: 6.2 g/dL (ref 6.1–8.1)

## 2019-12-15 DIAGNOSIS — L723 Sebaceous cyst: Secondary | ICD-10-CM | POA: Diagnosis not present

## 2019-12-15 MED FILL — FLUCONAZOLE 150 MG TABS: 150 | 3 days supply | Qty: 3 | Fill #0

## 2019-12-15 MED FILL — SULFAMETHOXAZOLE-TMP DS TAB: 800-160 | 10 days supply | Qty: 20 | Fill #0

## 2019-12-15 MED FILL — LISINOPRIL-HCTZ 20-25 MG TA: 20-25 | 90 days supply | Qty: 45 | Fill #0

## 2019-12-15 MED FILL — ATORVASTATIN CALCIUM 20 MG: 20 | 90 days supply | Qty: 90 | Fill #1

## 2019-12-15 MED FILL — VIIBRYD 20 MG TABLET: 20 | 90 days supply | Qty: 90 | Fill #1

## 2019-12-26 DIAGNOSIS — H524 Presbyopia: Secondary | ICD-10-CM | POA: Diagnosis not present

## 2019-12-26 DIAGNOSIS — H52221 Regular astigmatism, right eye: Secondary | ICD-10-CM | POA: Diagnosis not present

## 2020-01-29 ENCOUNTER — Other Ambulatory Visit: Payer: Self-pay | Admitting: Osteopathic Medicine

## 2020-01-29 DIAGNOSIS — Z1231 Encounter for screening mammogram for malignant neoplasm of breast: Secondary | ICD-10-CM

## 2020-03-06 IMAGING — US US PELVIS COMPLETE
1 series · 13 of 25 positions shown · non-contrast
Comparison: None

CLINICAL DATA: Postmenopausal bleeding

EXAM:
TRANSABDOMINAL AND TRANSVAGINAL ULTRASOUND OF PELVIS
TECHNIQUE: Both transabdominal and transvaginal ultrasound examinations of the
pelvis were performed. Transabdominal technique was performed for
global imaging of the pelvis including uterus, ovaries, adnexal
regions, and pelvic cul-de-sac. It was necessary to proceed with
endovaginal exam following the transabdominal exam to visualize the
endometrium and ovaries.

[Series 1: us pelvis complete · 0.24mm/px · 13 of 139 slices shown]
[im 1/139]
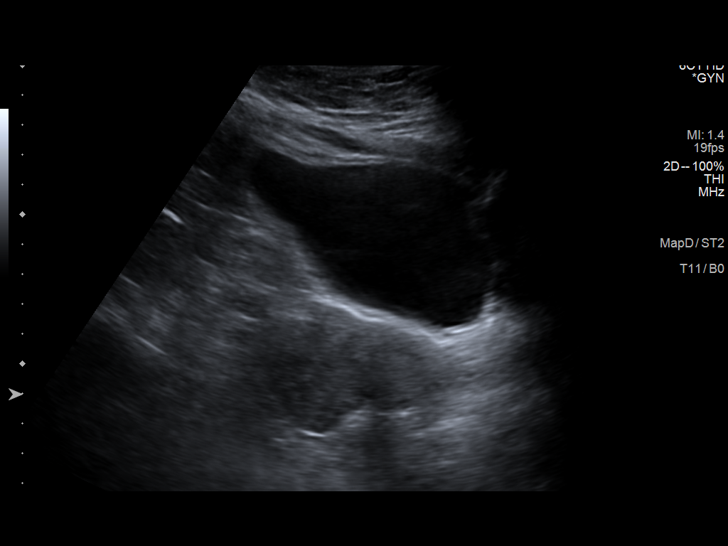
[im 12/139]
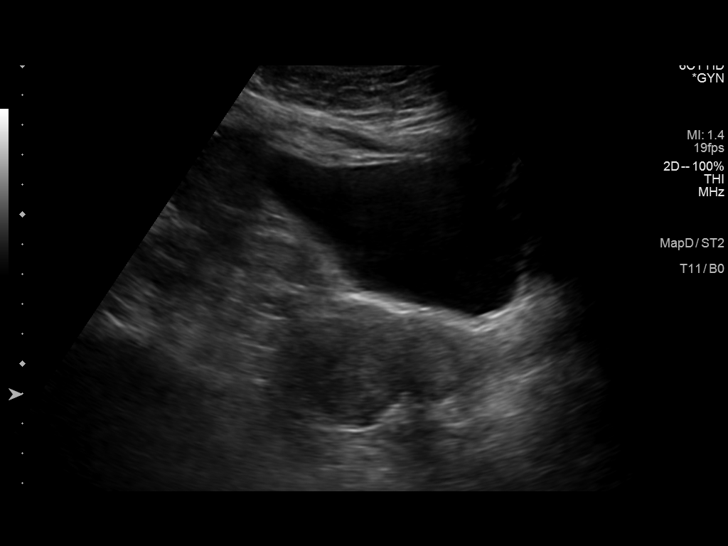
[im 24/139]
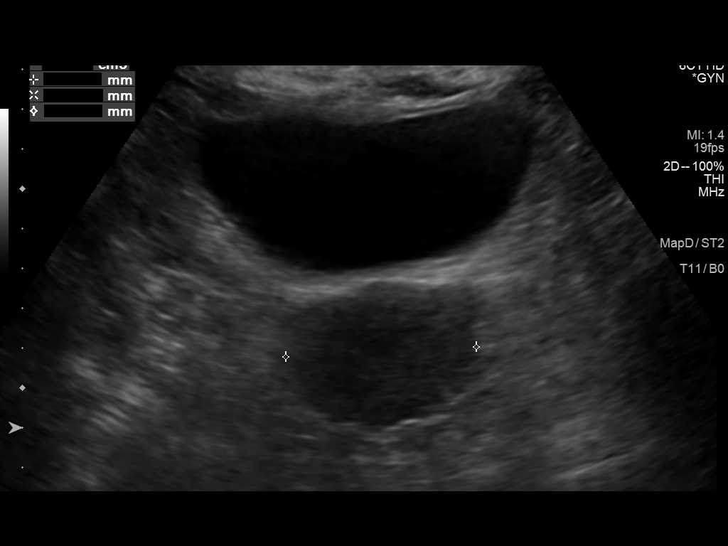
[im 35/139]
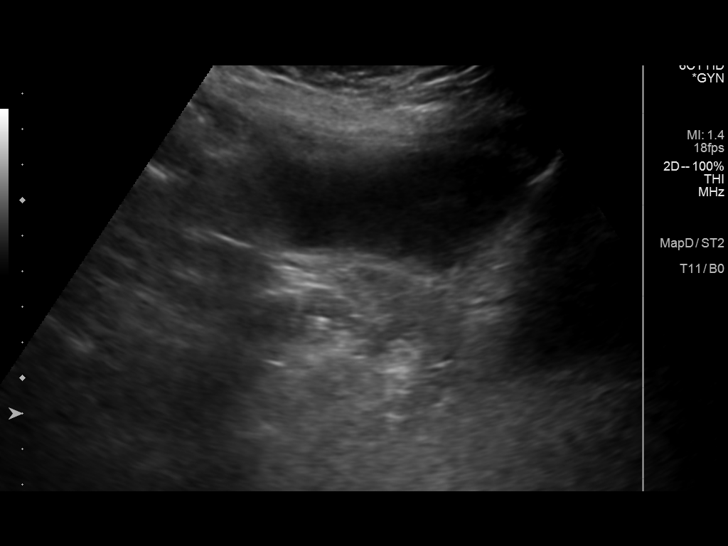
[im 47/139]
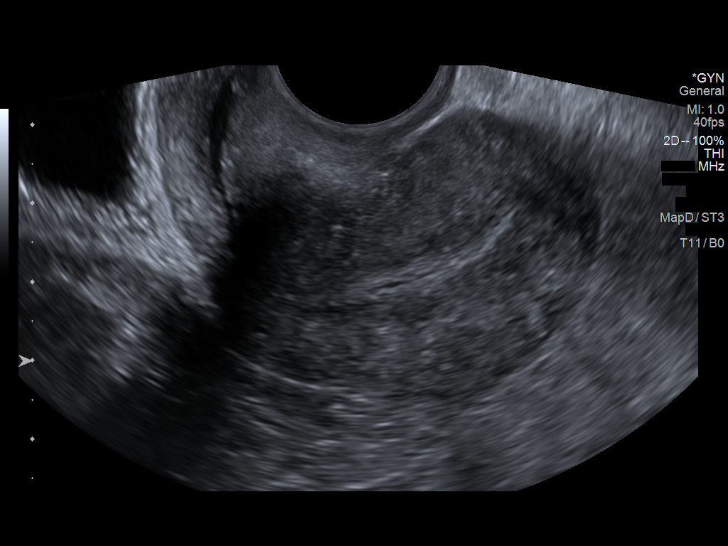
[im 58/139]
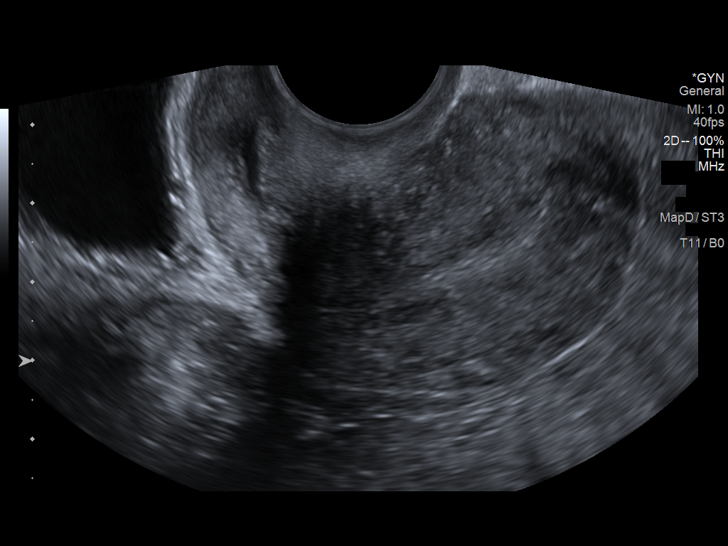
[im 70/139]
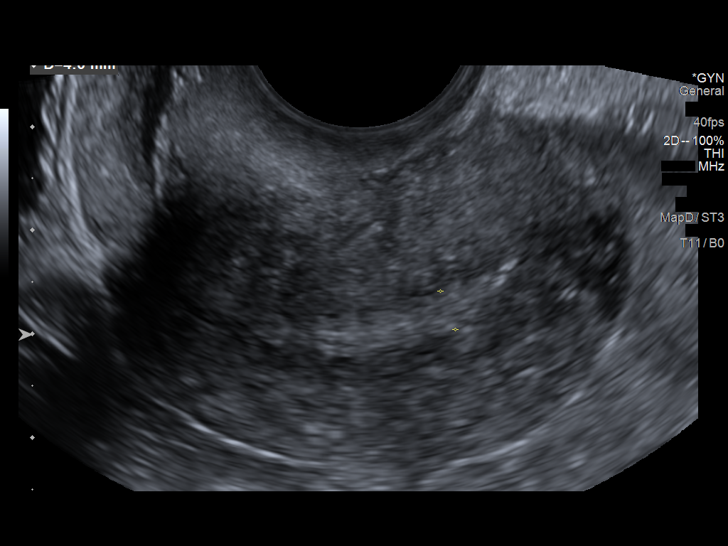
[im 81/139]
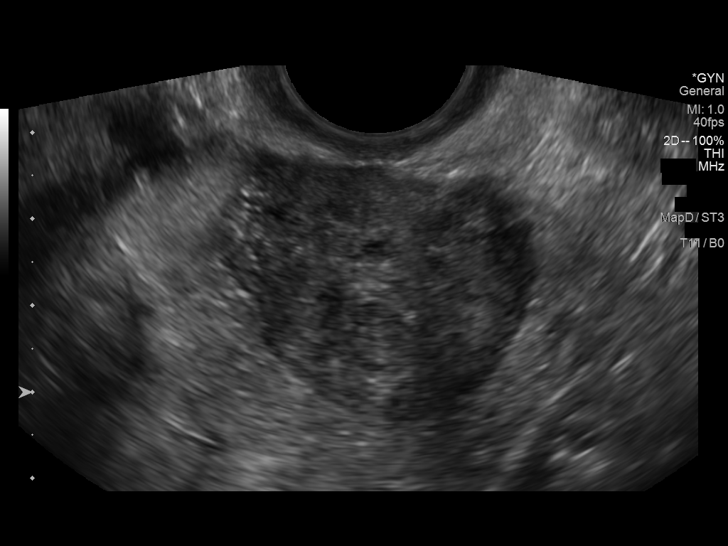
[im 93/139]
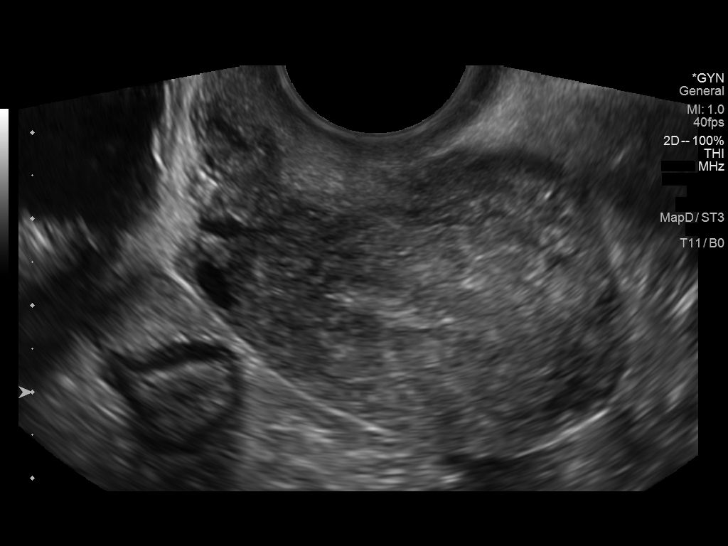
[im 104/139]
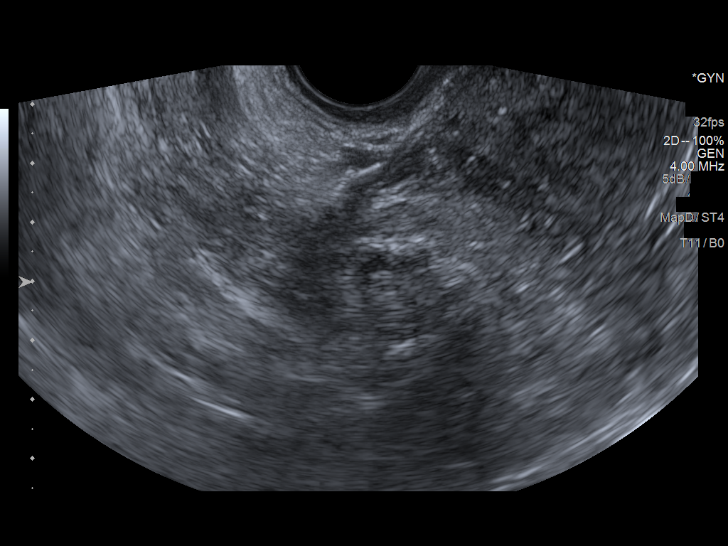
[im 116/139]
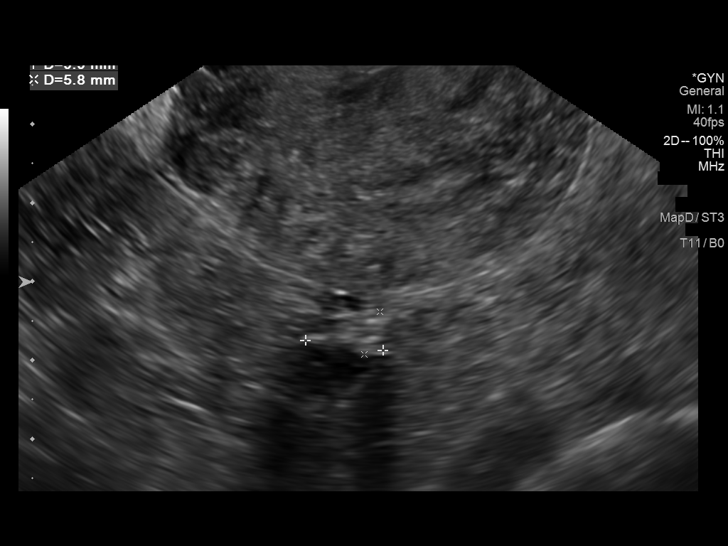
[im 127/139]
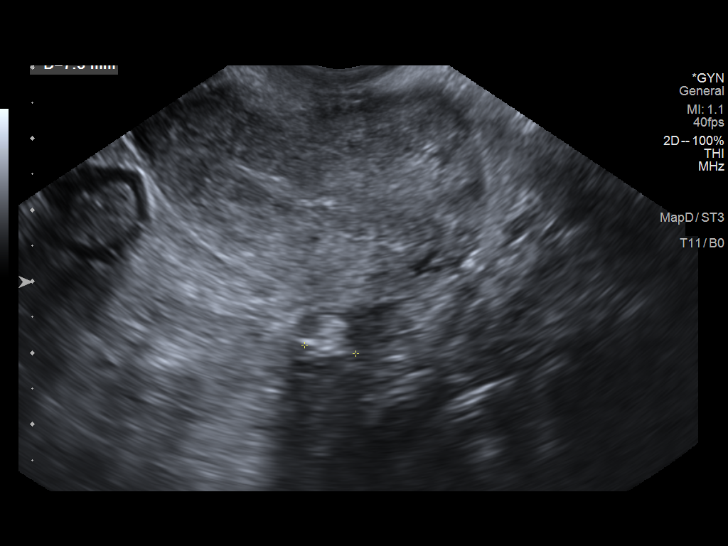
[im 139/139]
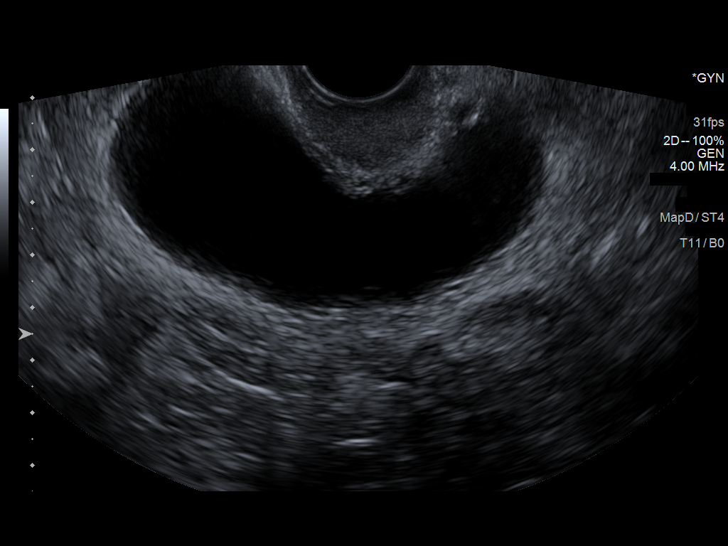

[13 of 25 positions shown; findings below may reference images not displayed]

FINDINGS: Uterus

Measurements: 5.7 x 3.1 x 4.5 cm = volume: 41 mL. No masses. There
is a small amount of fluid in the cervix consistent with history of
bleeding.

Endometrium

Thickness: 4.4 mm.  No focal abnormality visualized.

Right ovary

Not visualized.

Left ovary

Measurements: 2.7 x 1.3 x 1.8 cm = volume: 3 mL mL. There is a small
10 mm region of increased echogenicity in the left ovary of doubtful
significance.

Other findings

Urine remains in the bladder after voiding.
IMPRESSION: 1. The endometrium is normal for age. In the setting of
post-menopausal bleeding, this is consistent with a benign etiology
such as endometrial atrophy. If bleeding remains unresponsive to
hormonal or medical therapy, sonohysterogram should be considered
for focal lesion work-up. (Ref: Radiological Reasoning: Algorithmic
Workup of Abnormal Vaginal Bleeding with Endovaginal Sonography and
Sonohysterography. AJR 1886; 191:S68-73)
2. There is a small amount of fluid in the cervix, consistent with
history of bleeding.
3. Urine remains in the bladder after voiding.

## 2020-03-20 ENCOUNTER — Ambulatory Visit: Payer: 59

## 2020-04-01 MED FILL — ATORVASTATIN CALCIUM 20 MG: 20 | 90 days supply | Qty: 90 | Fill #2

## 2020-04-01 MED FILL — LISINOPRIL-HCTZ 20-25 MG TA: 20-25 | 90 days supply | Qty: 45 | Fill #1

## 2020-05-02 ENCOUNTER — Ambulatory Visit (INDEPENDENT_AMBULATORY_CARE_PROVIDER_SITE_OTHER): Payer: 59

## 2020-05-02 ENCOUNTER — Other Ambulatory Visit: Payer: Self-pay

## 2020-05-02 DIAGNOSIS — Z1231 Encounter for screening mammogram for malignant neoplasm of breast: Secondary | ICD-10-CM | POA: Diagnosis not present

## 2020-06-06 ENCOUNTER — Encounter: Payer: Self-pay | Admitting: Osteopathic Medicine

## 2020-06-06 ENCOUNTER — Ambulatory Visit (INDEPENDENT_AMBULATORY_CARE_PROVIDER_SITE_OTHER): Payer: 59 | Admitting: Osteopathic Medicine

## 2020-06-06 ENCOUNTER — Other Ambulatory Visit: Payer: Self-pay

## 2020-06-06 ENCOUNTER — Ambulatory Visit (INDEPENDENT_AMBULATORY_CARE_PROVIDER_SITE_OTHER): Payer: 59

## 2020-06-06 VITALS — BP 135/75 | HR 67 | Temp 97.8°F | Wt 190.1 lb

## 2020-06-06 DIAGNOSIS — M7061 Trochanteric bursitis, right hip: Secondary | ICD-10-CM | POA: Diagnosis not present

## 2020-06-06 DIAGNOSIS — R0683 Snoring: Secondary | ICD-10-CM

## 2020-06-06 DIAGNOSIS — M25561 Pain in right knee: Secondary | ICD-10-CM

## 2020-06-06 DIAGNOSIS — G8929 Other chronic pain: Secondary | ICD-10-CM | POA: Diagnosis not present

## 2020-06-06 DIAGNOSIS — M25551 Pain in right hip: Secondary | ICD-10-CM

## 2020-06-06 DIAGNOSIS — B070 Plantar wart: Secondary | ICD-10-CM

## 2020-06-06 MED ORDER — MELOXICAM 15 MG PO TABS: 15.0000 mg | ORAL_TABLET | Freq: Every day | ORAL | 0 refills | Status: DC

## 2020-06-06 MED FILL — MELOXICAM 15 MG TABLET: 15 | 30 days supply | Qty: 30 | Fill #0

## 2020-06-06 NOTE — Patient Instructions (Signed)
Plan:  See printed instructions for home exercises / stretches  Anti-inflammatory (meloxicam) take daily for 5-7 days then can keep on had for use as-needed after that  refer for formal physical therapy  Please contact our office for sports medicine appointment with Dr T if PT not helping

## 2020-06-06 NOTE — Progress Notes (Signed)
OPENED IN ERROR

## 2020-06-06 NOTE — Progress Notes (Signed)
Nicole Osborne is a 60 y.o. female who presents to  [place] [facility]  today, 06/06/20, seeking care for the following:  . R leg and hip pain - R hip tender laterally, no injury. R knee had injections in 2015 which helped, dx meniscal injury at that time, notes it's a bit worse on and off recently. Soreness in R hip more recently w/ ski trip.  . Concern for plantar wart on R foot . Requests sleep study: significant snoring, fatigue      ASSESSMENT & PLAN with other pertinent findings:  The primary encounter diagnosis was Trochanteric bursitis of right hip. Diagnoses of Chronic pain of right knee, Right hip pain, Snoring, and Plantar wart were also pertinent to this visit.   1. Trochanteric bursitis of right hip 3. Right hip pain --> PT, NSAID, XR  2. Chronic pain of right knee --> PT, NSAID, XR  4. Snoring --> sleep study  5. Plantar wart --> informed consent today for cryotherapy applied to plantar aspect R foot pt tolerated well advised RTC prn repeat treatment    Patient Instructions  Plan:  See printed instructions for home exercises / stretches  Anti-inflammatory (meloxicam) take daily for 5-7 days then can keep on had for use as-needed after that  refer for formal physical therapy  Please contact our office for sports medicine appointment with Dr T if PT not helping      Orders Placed This Encounter  Procedures  . DG Hip Unilat W OR W/O Pelvis 2-3 Views Right  . DG Knee Complete 4 Views Right  . Ambulatory referral to Physical Therapy  . Home sleep test    Meds ordered this encounter  Medications  . meloxicam (MOBIC) 15 MG tablet    Sig: Take 1 tablet (15 mg total) by mouth daily.    Dispense:  30 tablet    Refill:  0     See below for relevant physical exam findings  See below for recent lab and imaging results reviewed  Medications, allergies, PMH, PSH, SocH, FamH reviewed  below    Follow-up instructions: Return for VISIT WITH SPORTS MEDICINE FOR HIP/KNEE EVALUATION IF PAIN PERSISTS.                                        Exam:  BP 135/75 (BP Location: Left Arm, Patient Position: Sitting, Cuff Size: Normal)   Pulse 67   Temp 97.8 F (36.6 C) (Oral)   Wt 190 lb 1.9 oz (86.2 kg)   BMI 32.63 kg/m   Constitutional: VS see above. General Appearance: alert, well-developed, well-nourished, NAD  Neck: No masses, trachea midline.   Respiratory: Normal respiratory effort.  Musculoskeletal: Gait normal. Symmetric and independent movement of all extremities. Tenderness at R greater trochanter, neg FABER/FADIR, neg log roll, normal strength hip flex/ext/abd/adduct. No knee crepitus, neg meniscal signes on R   Neurological: Normal balance/coordination. No tremor.  Skin: warm, dry, intact. Plantar wart R foot, small <0.2 cm  Psychiatric: Normal judgment/insight. Normal mood and affect. Oriented x3.   Current Meds  Medication Sig  . CALCIUM PO Take by mouth.  Marland Kitchen lisinopril-hydrochlorothiazide (ZESTORETIC) 20-25 MG tablet Take 0.5 tablets by mouth daily.  . meloxicam (MOBIC) 15 MG tablet Take 1 tablet (15 mg total) by mouth daily.  . Multiple Vitamin (MULTIVITAMIN) tablet Take 1 tablet by mouth  daily.  . Prenatal Vit-Fe Fumarate-FA (M-VIT PO) Take by mouth.  . Vilazodone HCl (VIIBRYD) 20 MG TABS Take 1 tablet (20 mg total) by mouth daily.  Marland Kitchen VITAMIN D PO Take by mouth.    No Known Allergies  Patient Active Problem List   Diagnosis Date Noted  . Shortness of breath 05/30/2019  . Colicky RUQ abdominal pain 03/28/2018  . Fatty liver disease, nonalcoholic 22/33/6122  . Gallbladder sludge 03/28/2018  . Atypical chest pain 01/22/2016  . Hyperglycemia 07/23/2014  . Elevated LFTs 04/25/2014  . History of colonoscopy 03/28/2014  . Hypertension 03/02/2014  . Hyperlipidemia 03/02/2014  . Depression, major, single episode,  in partial remission ([place]) 03/02/2014    Family History  Problem Relation Age of Onset  . Hypertension Mother   . Diabetes Mother   . Lung cancer Father   . Breast cancer Sister 76  . Leukemia Sister   . Colon cancer Maternal Grandmother   . Lung cancer Sister   . CAD Neg Hx     Social History   Tobacco Use  Smoking Status Never Smoker  Smokeless Tobacco Never Used    Past Surgical History:  Procedure Laterality Date  . BREAST BIOPSY Right   . BREAST EXCISIONAL BIOPSY Right 2012  . CESAREAN SECTION     x 3  . DILATION AND CURETTAGE OF UTERUS    . TUBAL LIGATION      Immunization History  Administered Date(s) Administered  . Influenza-Unspecified 12/29/2014, 12/29/2015, 12/28/2016, 01/28/2019  . PFIZER(Purple Top)SARS-COV-2 Vaccination 05/24/2019, 06/21/2019  . Zoster Recombinat (Shingrix) 10/12/2018, 04/12/2019    No results found for this or any previous visit (from the past 2160 hour(s)).  No results found.     All questions at time of visit were answered - patient instructed to contact office with any additional concerns or updates. ER/RTC precautions were reviewed with the patient as applicable.   Please note: manual typing as well as voice recognition software may have been used to produce this document - typos may escape review. Please contact Dr. Sheppard Coil for any needed clarifications.

## 2020-06-06 NOTE — Addendum Note (Signed)
Addended by: Maryla Morrow on: 06/06/2020 05:22 PM   Modules accepted: Orders

## 2020-06-11 ENCOUNTER — Ambulatory Visit (INDEPENDENT_AMBULATORY_CARE_PROVIDER_SITE_OTHER): Payer: 59 | Admitting: Rehabilitative and Restorative Service Providers"

## 2020-06-11 ENCOUNTER — Other Ambulatory Visit: Payer: Self-pay

## 2020-06-11 DIAGNOSIS — M6281 Muscle weakness (generalized): Secondary | ICD-10-CM

## 2020-06-11 DIAGNOSIS — M546 Pain in thoracic spine: Secondary | ICD-10-CM

## 2020-06-11 DIAGNOSIS — M25551 Pain in right hip: Secondary | ICD-10-CM

## 2020-06-11 NOTE — Patient Instructions (Signed)
Access Code: [email or web address] URL: [email or web address] Date: 06/11/2020 Prepared by: Rudell Cobb  Exercises Prone Quadriceps Stretch with Strap - 2 x daily - 7 x weekly - 1 sets - 3 reps - 30 seconds hold sidelying low back stretch - 2 x daily - 7 x weekly - 1 sets - 3 reps - 30 seconds hold Sidelying Open Book Thoracic Lumbar Rotation and Extension - 2 x daily - 7 x weekly - 1 sets - 10 reps Sidelying Hip Abduction - 2 x daily - 7 x weekly - 1 sets - 10 reps

## 2020-06-11 NOTE — Therapy (Signed)
[place], [place], [address] Phone: [phone]   Fax:  [phone]  Physical Therapy Evaluation  Patient Details  Name: Nicole Osborne MRN: [id number] Date of Birth: [birth year] Referring Provider (PT): Emeterio Reeve, DO   Encounter Date: 06/11/2020   PT End of Session - 06/11/20 0932    Visit Number 1    Number of Visits 12    Date for PT Re-Evaluation 07/23/20    Authorization Type [facility]    PT Start Time 5102    PT Stop Time 0938    PT Time Calculation (min) 46 min    Activity Tolerance Patient tolerated treatment well    Behavior During Therapy [facility] for tasks assessed/performed           Past Medical History:  Diagnosis Date  . Anemia   . Colon polyp   . Depression   . Diverticulosis   . Gallbladder sludge   . Hypertension   . Hypertriglyceridemia     Past Surgical History:  Procedure Laterality Date  . BREAST BIOPSY Right   . BREAST EXCISIONAL BIOPSY Right 2012  . CESAREAN SECTION     x 3  . DILATION AND CURETTAGE OF UTERUS    . TUBAL LIGATION      There were no vitals filed for this visit.    Subjective Assessment - 06/11/20 0855    Subjective The patient reports onset of R hip pain 6 months ago.  She noticed some pain after having to wear the cam walker for a foot fracture.  She also reports new onset of more significant back pain after using pressure washer 06/01/20.    Patient Stated Goals pain free, better movement    Currently in Pain? Yes    Pain Score 5     Pain Location Hip    Pain Orientation Right    Pain Descriptors / Indicators Sore;Tightness    Pain Onset More than a month ago    Pain Frequency Intermittent    Aggravating Factors  laying on the right side    Pain Relieving Factors repositioning    Multiple Pain Sites Yes    Pain Score 7    Pain Location Back    Pain Orientation Mid    Pain Descriptors / Indicators Sharp    Pain Onset  1 to 4 weeks ago    Pain Frequency Constant    Aggravating Factors  "takes your breath away"; worse with rotation    Pain Relieving Factors rest              [facility] PT Assessment - 06/11/20 0900      Assessment   Medical Diagnosis trochanteric bursisit of the right hip; mid back pain    Referring Provider (PT) Emeterio Reeve, DO    Onset Date/Surgical Date 06/01/20    Prior Therapy none      Precautions   Precautions None      Balance Screen   Has the patient fallen in the past 6 months No    Has the patient had a decrease in activity level because of a fear of falling?  No    Is the patient reluctant to leave their home because of a fear of falling?  No      Home Social worker Private residence    Living Arrangements Spouse/significant other      Prior Function   Level of [place]  Observation/Other Assessments   Focus on Therapeutic Outcomes (FOTO)  63%      Sensation   Light Touch Appears Intact      Posture/Postural Control   Posture Comments rounded spine with tightness in upper thoracic spine and forward head      ROM / Strength   AROM / PROM / Strength AROM;Strength      AROM   Overall AROM Comments --    AROM Assessment Site Lumbar;Hip    Right/Left Hip Right;Left    Right Hip Flexion --   pain with active R hip ROM   Lumbar Flexion 25% limitation   pain in R hip   Lumbar Extension 25% limitation   mid back pain   Lumbar - Right Side Bend WNLs   R hip pain   Lumbar - Left Side Bend WNLs   mid back pain   Lumbar - Right Rotation 25% limitation   pain in mid back   Lumbar - Left Rotation 25% limitation   pain in mid back     Strength   Overall Strength Comments --    Strength Assessment Site Hip;Knee;Ankle    Right/Left Hip Right;Left    Right Hip Flexion 5/5   pain   Right Hip Extension 4/5    Right Hip ABduction 4-/5    Left Hip Flexion 5/5    Left Hip Extension 4/5    Left Hip ABduction 5/5       Palpation   Spinal mobility significant hypomobility throughout spine more painful and with mid thoracic CPA mobs and UPA mobs    Palpation comment point tender to palpation R quads, R QL, R IT Band, thoracic multifidi, thoracic musculature                      Objective measurements completed on examination: See above findings.       [place] Adult PT Treatment/Exercise - 06/11/20 0900      Exercises   Exercises Neck;Lumbar;Knee/Hip      Neck Exercises: Sidelying   Other Sidelying Exercise thoracic rotation/ open book      Lumbar Exercises: Stretches   Other Lumbar Stretch Exercise QL stretch L sidelying x 30 seconds with passive overpressure      Lumbar Exercises: Sidelying   Hip Abduction Right;10 reps      Knee/Hip Exercises: Stretches   Sports administrator Right;Left;2 reps;30 seconds      Modalities   Modalities Electrical Stimulation;Moist Heat      Moist Heat Therapy   Number Minutes Moist Heat 12 Minutes    Moist Heat Location --   thoracic spine     Electrical Stimulation   Electrical Stimulation Location thoracic spine    Electrical Stimulation Action interferential    Electrical Stimulation Parameters to tolerance    Electrical Stimulation Goals Pain                  PT Education - 06/11/20 0931    Education Details HEP    Person(s) Educated Patient    Methods Explanation;Demonstration;Handout    Comprehension Verbalized understanding;Returned demonstration               PT Long Term Goals - 06/11/20 0933      PT LONG TERM GOAL #1   Title The patient will be indep with HEP for R hip strength, thoracic mobility, LE flexibility.    Time 6    Period Weeks    Target Date 07/23/20  PT LONG TERM GOAL #2   Title The patient will improve functional status score from 63% up to 69%.    Time 6    Period Weeks    Target Date 07/23/20      PT LONG TERM GOAL #3   Title The patient will report mid back pain < or equal to 2/10.    Time  6    Period Weeks    Target Date 07/23/20      PT LONG TERM GOAL #4   Title The patient will report no pain with sidelying in the bed in the R hip.    Time 6    Period Weeks    Target Date 07/23/20      PT LONG TERM GOAL #5   Title The patient will improve LE strength to 5/5 for R hip abduction and extension.    Time 6    Period Weeks    Target Date 07/23/20                  Plan - 06/11/20 0941    Clinical Impression Statement The patient is a 60yo female presenting to OP rehab with referral for R trochanteric bursitis.  At today's eval, her primary area of pain is the mid back that is an acute onset of pain after using the pressure washer on 06/01/20.  PT assessed R hip and mid back during the evaluation.  The patient presents with impairments in R hip strength, muscle shortening in R quad, myofascial trigger points, hypomobility in mid thoracic spine, spinal muscle guarding.  Impairments are leading to difficulty positioning to sleep, decreased participation in exercise, discomfort during work and home tasks.  PT to address deficits to return to prior functional status.    Personal Factors and Comorbidities Comorbidity 2    Comorbidities HTN, back and hip pain    Examination-Activity Limitations Locomotion Level;Sleep;Squat;Lift;Reach Overhead    Examination-Participation Restrictions Driving;Occupation;Cleaning;Community Activity    Stability/Clinical Decision Making Stable/Uncomplicated    Clinical Decision Making Low    Rehab Potential Good    PT Frequency 2x / week    PT Duration 6 weeks    PT Treatment/Interventions ADLs/Self Care Home Management;Taping;Patient/family education;Dry needling;Manual techniques;Therapeutic exercise;Therapeutic activities;Iontophoresis 4mg /ml Dexamethasone;Moist Heat;Electrical Stimulation;Cryotherapy;Aquatic Therapy    PT Next Visit Plan thoracic mobilization (manual and mobilization with movement), STM/DN for soft tissue restrictions, glut  med strength, foam rolling quad, continue HEP, modalities for pain prn    PT Home Exercise Plan [email or web address]    Consulted and Agree with Plan of Care Patient           Patient will benefit from skilled therapeutic intervention in order to improve the following deficits and impairments:  Hypomobility,Impaired flexibility,Pain,Decreased strength,Decreased mobility,Postural dysfunction,Improper body mechanics,Decreased range of motion,Increased fascial restricitons  Visit Diagnosis: Pain in right hip  Pain in thoracic spine  Muscle weakness (generalized)     Problem List Patient Active Problem List   Diagnosis Date Noted  . Shortness of breath 05/30/2019  . Colicky RUQ abdominal pain 03/28/2018  . Fatty liver disease, nonalcoholic 16/12/9602  . Gallbladder sludge 03/28/2018  . Atypical chest pain 01/22/2016  . Hyperglycemia 07/23/2014  . Elevated LFTs 04/25/2014  . History of colonoscopy 03/28/2014  . Hypertension 03/02/2014  . Hyperlipidemia 03/02/2014  . Depression, major, single episode, in partial remission ([place]) 03/02/2014    Larisha Vencill 06/11/2020, 10:39 AM  [facility] [place], [place], [address] Phone: [phone]  Fax:  [phone]  Name: KELLEN HOVER MRN: [id number] Date of Birth: 11-02-1960

## 2020-06-14 ENCOUNTER — Ambulatory Visit (INDEPENDENT_AMBULATORY_CARE_PROVIDER_SITE_OTHER): Payer: 59 | Admitting: Physical Therapy

## 2020-06-14 ENCOUNTER — Other Ambulatory Visit: Payer: Self-pay

## 2020-06-14 ENCOUNTER — Encounter: Payer: Self-pay | Admitting: Physical Therapy

## 2020-06-14 DIAGNOSIS — M25551 Pain in right hip: Secondary | ICD-10-CM

## 2020-06-14 DIAGNOSIS — M6281 Muscle weakness (generalized): Secondary | ICD-10-CM | POA: Diagnosis not present

## 2020-06-14 DIAGNOSIS — M546 Pain in thoracic spine: Secondary | ICD-10-CM | POA: Diagnosis not present

## 2020-06-14 NOTE — Therapy (Signed)
[place], [place], [address] Phone: [phone]   Fax:  [phone]  Physical Therapy Treatment  Patient Details  Name: Nicole Osborne MRN: [id number] Date of Birth: Feb 10, 1961 Referring Provider (PT): Emeterio Reeve, DO   Encounter Date: 06/14/2020   PT End of Session - 06/14/20 0844    Visit Number 2    Number of Visits 12    Date for PT Re-Evaluation 07/23/20    Authorization Type [facility]    PT Start Time 0848    PT Stop Time 0938    PT Time Calculation (min) 50 min    Activity Tolerance Patient tolerated treatment well    Behavior During Therapy [facility] for tasks assessed/performed           Past Medical History:  Diagnosis Date  . Anemia   . Colon polyp   . Depression   . Diverticulosis   . Gallbladder sludge   . Hypertension   . Hypertriglyceridemia     Past Surgical History:  Procedure Laterality Date  . BREAST BIOPSY Right   . BREAST EXCISIONAL BIOPSY Right 2012  . CESAREAN SECTION     x 3  . DILATION AND CURETTAGE OF UTERUS    . TUBAL LIGATION      There were no vitals filed for this visit.   Subjective Assessment - 06/14/20 0848    Subjective Pt reports she has less pain than last visit.  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  Yesterday, hip was very achy after work.    Patient Stated Goals pain free, better movement    Currently in Pain? Yes    Pain Score 4     Pain Location Back    Pain Orientation Mid    Pain Descriptors / Indicators Aching    Pain Onset More than a month ago    Pain Onset 1 to 4 weeks ago              [facility] PT Assessment - 06/14/20 0001      Assessment   Medical Diagnosis trochanteric bursisit of the right hip; mid back pain    Referring Provider (PT) Emeterio Reeve, DO    Onset Date/Surgical Date 06/01/20    Prior Therapy none              [facility] Adult PT Treatment/Exercise - 06/14/20 0001      Self-Care   Self-Care Other Self-Care Comments    Other Self-Care Comments  Pt instructed in self massage to LE with roller stick and ball to midback to decrease fascial restrictions; pt returned demo with cues.      Lumbar Exercises: Stretches   Lower Trunk Rotation 2 reps;10 seconds   arms in Y   ITB Stretch Right;1 rep;30 seconds   per HEP (sideling LB stretch)   Other Lumbar Stretch Exercise thoracic ext over back of chair x 1 rep of 10 sec, hands supporting head.  Thoracic ext over yoga mat with knees bent x 10 sec (limited tolerance for thoracic ext)      Lumbar Exercises: Aerobic   Nustep L4: legs only x 5 min      Lumbar Exercises: Sidelying   Hip Abduction Right;10 reps   2 sets; good form with minimal cues.   Other Sidelying Lumbar Exercises open book x 6 reps each side.  Knee/Hip Exercises: Stretches   Sports administrator Right;3 reps   prone with strap   Hip Flexor Stretch Right;20 seconds;1 rep   seated   Piriformis Stretch Right;Left;1 rep;30 seconds   supine, fig 4 pulling legs up   Other Knee/Hip Stretches Rt/Lt ITB stretch in hooklying with LTR x 20 sec each      Moist Heat Therapy   Number Minutes Moist Heat 10 Minutes    Moist Heat Location --   thoracic     Electrical Stimulation   Electrical Stimulation Location mid thoracic paraspinals    Electrical Stimulation Action IFC    Electrical Stimulation Parameters 10 min, intensity to tolerance    Electrical Stimulation Goals Pain                  PT Education - 06/14/20 0932    Education Details issued samples of rock sauce and biofreeze.    Person(s) Educated Patient    Methods Explanation    Comprehension Verbalized understanding               PT Long Term Goals - 06/11/20 0933      PT LONG TERM GOAL #1   Title The patient will be indep with HEP for R hip strength, thoracic mobility, LE flexibility.    Time 6    Period Weeks    Target  Date 07/23/20      PT LONG TERM GOAL #2   Title The patient will improve functional status score from 63% up to 69%.    Time 6    Period Weeks    Target Date 07/23/20      PT LONG TERM GOAL #3   Title The patient will report mid back pain < or equal to 2/10.    Time 6    Period Weeks    Target Date 07/23/20      PT LONG TERM GOAL #4   Title The patient will report no pain with sidelying in the bed in the R hip.    Time 6    Period Weeks    Target Date 07/23/20      PT LONG TERM GOAL #5   Title The patient will improve LE strength to 5/5 for R hip abduction and extension.    Time 6    Period Weeks    Target Date 07/23/20                 Plan - 06/14/20 0845    Clinical Impression Statement Tightness noted in Rt quad and midback.  LImited tolerance for thoracic ext in sitting and hookling.  Reviewed HEP and trialed additional stretches.  Positive response to stretches in HEP so far. Pt reported reduction of pain in back with MHP/estim at end of session.  Goals are ongoing.    Rehab Potential Good    PT Frequency 2x / week    PT Duration 6 weeks    PT Treatment/Interventions ADLs/Self Care Home Management;Taping;Patient/family education;Dry needling;Manual techniques;Therapeutic exercise;Therapeutic activities;Iontophoresis 4mg /ml Dexamethasone;Moist Heat;Electrical Stimulation;Cryotherapy;Aquatic Therapy    PT Next Visit Plan thoracic mobilization (manual and mobilization with movement), STM/DN for soft tissue restrictions, glut med strength, foam rolling quad, continue HEP, modalities for pain prn    PT Home Exercise Plan [email or web address]    Consulted and Agree with Plan of Care Patient           Patient will benefit from skilled therapeutic intervention in order to improve the following deficits and impairments:  Hypomobility,Impaired flexibility,Pain,Decreased strength,Decreased mobility,Postural dysfunction,Improper body mechanics,Decreased range of motion,Increased fascial  restricitons  Visit Diagnosis: Pain in right hip  Pain in thoracic spine  Muscle weakness (generalized)     Problem List Patient Active Problem List   Diagnosis Date Noted  . Shortness of breath 05/30/2019  . Colicky RUQ abdominal pain 03/28/2018  . Fatty liver disease, nonalcoholic 08/67/6195  . Gallbladder sludge 03/28/2018  . Atypical chest pain 01/22/2016  . Hyperglycemia 07/23/2014  . Elevated LFTs 04/25/2014  . History of colonoscopy 03/28/2014  . Hypertension 03/02/2014  . Hyperlipidemia 03/02/2014  . Depression, major, single episode, in partial remission ([place]) 03/02/2014   Kerin Perna, PTA 06/14/20 9:33 AM  [facility] [place], [place], [address] Phone: [phone]   Fax:  [phone]  Name: NOU CHARD MRN: [id number] Date of Birth: 06-13-1960

## 2020-06-17 ENCOUNTER — Ambulatory Visit (INDEPENDENT_AMBULATORY_CARE_PROVIDER_SITE_OTHER): Payer: 59 | Admitting: Physical Therapy

## 2020-06-17 ENCOUNTER — Other Ambulatory Visit: Payer: Self-pay

## 2020-06-17 ENCOUNTER — Encounter: Payer: Self-pay | Admitting: Physical Therapy

## 2020-06-17 DIAGNOSIS — M546 Pain in thoracic spine: Secondary | ICD-10-CM | POA: Diagnosis not present

## 2020-06-17 DIAGNOSIS — M6281 Muscle weakness (generalized): Secondary | ICD-10-CM | POA: Diagnosis not present

## 2020-06-17 DIAGNOSIS — M25551 Pain in right hip: Secondary | ICD-10-CM

## 2020-06-17 NOTE — Therapy (Signed)
[place], [place], [address] Phone: [phone]   Fax:  [phone]  Physical Therapy Treatment  Patient Details  Name: Nicole Osborne MRN: [id number] Date of Birth: 1960-12-24 Referring Provider (PT): Emeterio Reeve, DO   Encounter Date: 06/17/2020   PT End of Session - 06/17/20 1155    Visit Number 3    Number of Visits 12    Date for PT Re-Evaluation 07/23/20    Authorization Type [facility]    PT Start Time [phone]    PT Stop Time 1018    PT Time Calculation (min) 44 min    Activity Tolerance Patient tolerated treatment well    Behavior During Therapy [facility] for tasks assessed/performed           Past Medical History:  Diagnosis Date  . Anemia   . Colon polyp   . Depression   . Diverticulosis   . Gallbladder sludge   . Hypertension   . Hypertriglyceridemia     Past Surgical History:  Procedure Laterality Date  . BREAST BIOPSY Right   . BREAST EXCISIONAL BIOPSY Right 2012  . CESAREAN SECTION     x 3  . DILATION AND CURETTAGE OF UTERUS    . TUBAL LIGATION      There were no vitals filed for this visit.   Subjective Assessment - 06/17/20 0941    Subjective "I'm tired of the pain". Pt reports she strained her Rt thigh while moving a pressure washer up a ramp this weekend.  Work has been stressful.    Patient Stated Goals pain free, better movement    Currently in Pain? Yes    Pain Score 6     Pain Location Back    Pain Orientation Mid    Pain Descriptors / Indicators Aching    Pain Score 5   only with movement   Pain Location Leg   thigh   Pain Orientation Right    Pain Descriptors / Indicators Tightness;Aching              [facility] PT Assessment - 06/17/20 0001      Assessment   Medical Diagnosis trochanteric bursisit of the right hip; mid back pain    Referring Provider (PT) Emeterio Reeve, DO    Onset Date/Surgical Date 06/01/20    Prior Therapy none             [facility] Adult PT Treatment/Exercise - 06/17/20 0001      Lumbar Exercises: Aerobic   Nustep L5: legs/arms x 6 min (for warm up)      Lumbar Exercises: Standing   Row Strengthening;Both;10 reps    Theraband Level (Row) Level 3 (Green)      Lumbar Exercises: Sidelying   Other Sidelying Lumbar Exercises open book x 8 reps each side.      Lumbar Exercises: Prone   Other Prone Lumbar Exercises scap squeeze, shoulder ext x 5 sec x 5 reps.      Knee/Hip Exercises: Stretches   Land reps;Left;1 rep;30 seconds   prone with strap   Quad Stretch Limitations shown seated version with foot under chair x 20 sec RLE.      Shoulder Exercises: Stretch   Corner Stretch 2 reps;20 seconds    Other Shoulder Stretches midlevel doorway stretch x 20 sec - switched to corner      Moist Heat Therapy   Number Minutes Moist Heat 10 Minutes  Moist Heat Location --   thoracic     Electrical Stimulation   Electrical Stimulation Location mid thoracic paraspinals    Electrical Stimulation Action TENS    Electrical Stimulation Parameters 10 min, intensity to tolerance    Electrical Stimulation Goals Pain      Manual Therapy   Manual Therapy Soft tissue mobilization;Myofascial release    Soft tissue mobilization cross fiber friction to thoracic paraspinals to decrease fascial restrictions and pain.    Myofascial Release MFR to thoracic paraspinals                  PT Education - 06/17/20 1012    Education Details TENS info    Person(s) Educated Patient    Methods Explanation;Handout    Comprehension Verbalized understanding               PT Long Term Goals - 06/11/20 0933      PT LONG TERM GOAL #1   Title The patient will be indep with HEP for R hip strength, thoracic mobility, LE flexibility.    Time 6    Period Weeks    Target Date 07/23/20      PT LONG TERM GOAL #2   Title The patient will improve functional status score from 63% up to 69%.    Time  6    Period Weeks    Target Date 07/23/20      PT LONG TERM GOAL #3   Title The patient will report mid back pain < or equal to 2/10.    Time 6    Period Weeks    Target Date 07/23/20      PT LONG TERM GOAL #4   Title The patient will report no pain with sidelying in the bed in the R hip.    Time 6    Period Weeks    Target Date 07/23/20      PT LONG TERM GOAL #5   Title The patient will improve LE strength to 5/5 for R hip abduction and extension.    Time 6    Period Weeks    Target Date 07/23/20                 Plan - 06/17/20 1013    Clinical Impression Statement Pt required minor cues to relax shoulders down during doorway/corner stretches and row.  Pt tolerated exercises well, without increase in pain.  Pt reported reduction of back pain with STM and estim.  Pt making gradual progress towards goals.    Rehab Potential Good    PT Frequency 2x / week    PT Duration 6 weeks    PT Treatment/Interventions ADLs/Self Care Home Management;Taping;Patient/family education;Dry needling;Manual techniques;Therapeutic exercise;Therapeutic activities;Iontophoresis 4mg /ml Dexamethasone;Moist Heat;Electrical Stimulation;Cryotherapy;Aquatic Therapy    PT Next Visit Plan thoracic mobilization (manual and mobilization with movement), STM/DN for soft tissue restrictions, glut med strength, continue HEP, modalities for pain prn    PT Home Exercise Plan [email or web address]    Consulted and Agree with Plan of Care Patient           Patient will benefit from skilled therapeutic intervention in order to improve the following deficits and impairments:  Hypomobility,Impaired flexibility,Pain,Decreased strength,Decreased mobility,Postural dysfunction,Improper body mechanics,Decreased range of motion,Increased fascial restricitons  Visit Diagnosis: Pain in thoracic spine  Pain in right hip  Muscle weakness (generalized)     Problem List Patient Active Problem List   Diagnosis Date Noted  .  Shortness of breath 05/30/2019  .  Colicky RUQ abdominal pain 03/28/2018  . Fatty liver disease, nonalcoholic 40/98/1191  . Gallbladder sludge 03/28/2018  . Atypical chest pain 01/22/2016  . Hyperglycemia 07/23/2014  . Elevated LFTs 04/25/2014  . History of colonoscopy 03/28/2014  . Hypertension 03/02/2014  . Hyperlipidemia 03/02/2014  . Depression, major, single episode, in partial remission ([place]) 03/02/2014   Kerin Perna, PTA 06/17/20 11:57 AM  [place], [place], [address] Phone: [phone]   Fax:  [phone]  Name: Nicole Osborne MRN: [id number] Date of Birth: 1960/11/09

## 2020-06-17 NOTE — Patient Instructions (Signed)

## 2020-06-20 ENCOUNTER — Encounter: Payer: Self-pay | Admitting: Rehabilitative and Restorative Service Providers"

## 2020-06-20 ENCOUNTER — Other Ambulatory Visit: Payer: Self-pay

## 2020-06-20 ENCOUNTER — Ambulatory Visit (INDEPENDENT_AMBULATORY_CARE_PROVIDER_SITE_OTHER): Payer: 59 | Admitting: Rehabilitative and Restorative Service Providers"

## 2020-06-20 DIAGNOSIS — M6281 Muscle weakness (generalized): Secondary | ICD-10-CM

## 2020-06-20 DIAGNOSIS — M25551 Pain in right hip: Secondary | ICD-10-CM

## 2020-06-20 DIAGNOSIS — M546 Pain in thoracic spine: Secondary | ICD-10-CM

## 2020-06-20 NOTE — Patient Instructions (Signed)
Access Code: [email or web address] URL: [email or web address] Date: 06/20/2020 Prepared by: Rudell Cobb  Exercises Prone Quadriceps Stretch with Strap - 2 x daily - 7 x weekly - 1 sets - 3 reps - 30 seconds hold Sidelying Open Book Thoracic Lumbar Rotation and Extension - 2 x daily - 7 x weekly - 1 sets - 10 reps Supine Thoracic Mobilization Towel Roll Horizontal with Arm Stretch - 2 x daily - 7 x weekly - 1 sets - 1 reps - 1-2 minutes hold Corner Pec [place] Stretch - 2 x daily - 7 x weekly - 1 sets - 3 reps - 30 sec hold Standing with Forearms Thoracic Rotation - 2 x daily - 7 x weekly - 1 sets - 10 reps

## 2020-06-20 NOTE — Therapy (Addendum)
[place]  [place], [place], [address] Phone: [phone]   Fax:  [phone]  Physical Therapy Treatment  Patient Details  Name: Nicole Osborne MRN: [id number] Date of Birth: 1961/02/07 Referring Provider (PT): Emeterio Reeve, DO   Encounter Date: 06/20/2020   PT End of Session - 06/20/20 1033    Visit Number 4    Number of Visits 12    Date for PT Re-Evaluation 07/23/20    Authorization Type [employer]    PT Start Time [phone]    PT Stop Time 1020    PT Time Calculation (min) 45 min    Activity Tolerance Patient tolerated treatment well    Behavior During Therapy [facility] for tasks assessed/performed           Past Medical History:  Diagnosis Date  . Anemia   . Colon polyp   . Depression   . Diverticulosis   . Gallbladder sludge   . Hypertension   . Hypertriglyceridemia     Past Surgical History:  Procedure Laterality Date  . BREAST BIOPSY Right   . BREAST EXCISIONAL BIOPSY Right 2012  . CESAREAN SECTION     x 3  . DILATION AND CURETTAGE OF UTERUS    . TUBAL LIGATION      There were no vitals filed for this visit.   Subjective Assessment - 06/20/20 0936    Subjective The patient reports that she did not have pain after last session or the next day, however today has 5/10 pain in her thoracic spine.  She sat all day yesterday and feels pain is worse.  She is getting sharp, catching pain with specific movements like rotating in the car to check her blind spot, or getting up.  She can change positions and pain decreases.    Patient Stated Goals pain free, better movement    Currently in Pain? Yes    Pain Score 5     Pain Location Back    Pain Orientation Mid    Pain Descriptors / Indicators Aching    Pain Onset More than a month ago    Pain Frequency Intermittent    Aggravating Factors  driving, getting up    Pain Relieving Factors repositioning              [facility] PT  Assessment - 06/20/20 0938      Assessment   Medical Diagnosis trochanteric bursisit of the right hip; mid back pain    Referring Provider (PT) Emeterio Reeve, DO    Onset Date/Surgical Date 06/01/20                         [facility] Adult PT Treatment/Exercise - 06/20/20 0938      Exercises   Exercises Neck;Lumbar;Shoulder      Neck Exercises: Machines for Strengthening   UBE (Upper Arm Bike) L2 x 2.5 minutes forward, 1 minute back      Neck Exercises: Supine   Other Supine Exercise thoracic mobilization with towel roll into extension with arms overhead moving from upper t-spine to mid t-spine      Lumbar Exercises: Stretches   Lower Trunk Rotation 5 reps    Lower Trunk Rotation Limitations with towel roll under thoracic spine      Shoulder Exercises: Prone   Retraction Strengthening;Both;10 reps    Retraction Limitations prone scap retraction    Other Prone Exercises elbow propping for thoracic extension  Shoulder Exercises: Standing   Retraction Strengthening;Both;12 reps    Retraction Limitations with pool noodle      Shoulder Exercises: Stretch   Corner Stretch 2 reps;30 seconds    Corner Stretch Limitations cues for shoulder depression      Modalities   Modalities Moist Heat      Moist Heat Therapy   Number Minutes Moist Heat 12 Minutes    Moist Heat Location --   back-- ended with heat after DN, no charge for hot pack (not captured in minutes)     Manual Therapy   Manual Therapy Joint mobilization;Soft tissue mobilization    Manual therapy comments skilled palpation to assess response to STM and DN    Joint Mobilization to reduce spinal hypomobility;grade II-III CPA and lateral glides    Soft tissue mobilization thoracic paraspinal STM and rhomboids    Myofascial Release MFT to thoracic paraspinals            Trigger Point Dry Needling - 06/20/20 1038    Consent Given? Yes    Education Handout Provided Yes    Muscles Treated Back/Hip  Thoracic multifidi    Dry Needling Comments bilaterally    Other Dry Needling felt bony backdrop during multifidi DN    Thoracic multifidi response Palpable increased muscle length                PT Education - 06/20/20 1018    Education Details HEP    Person(s) Educated Patient    Methods Explanation;Demonstration;Handout    Comprehension Verbalized understanding;Returned demonstration               PT Long Term Goals - 06/11/20 0933      PT LONG TERM GOAL #1   Title The patient will be indep with HEP for R hip strength, thoracic mobility, LE flexibility.    Time 6    Period Weeks    Target Date 07/23/20      PT LONG TERM GOAL #2   Title The patient will improve functional status score from 63% up to 69%.    Time 6    Period Weeks    Target Date 07/23/20      PT LONG TERM GOAL #3   Title The patient will report mid back pain < or equal to 2/10.    Time 6    Period Weeks    Target Date 07/23/20      PT LONG TERM GOAL #4   Title The patient will report no pain with sidelying in the bed in the R hip.    Time 6    Period Weeks    Target Date 07/23/20      PT LONG TERM GOAL #5   Title The patient will improve LE strength to 5/5 for R hip abduction and extension.    Time 6    Period Weeks    Target Date 07/23/20                 Plan - 06/20/20 1037    Clinical Impression Statement The patient continues with pain in mid thoracic region and no pain in R hip (original referral).  She has increased soreness today after being on a webex for 8 hours yesterday.  PT performed ther ex and STM/DN to address paraspinal tightness.  Plan to progress to patient tolerance.    PT Treatment/Interventions ADLs/Self Care Home Management;Taping;Patient/family education;Dry needling;Manual techniques;Therapeutic exercise;Therapeutic activities;Iontophoresis 4mg /ml Dexamethasone;Moist Heat;Electrical Stimulation;Cryotherapy;Aquatic Therapy  PT Next Visit Plan thoracic  mobilization (manual and mobilization with movement), STM/DN for soft tissue restrictions, focus on t-spine ther ex as hip improved, progress HEP, modalities for pain prn    PT Home Exercise Plan [email or web address]    Consulted and Agree with Plan of Care Patient           Patient will benefit from skilled therapeutic intervention in order to improve the following deficits and impairments:     Visit Diagnosis: Pain in thoracic spine  Pain in right hip  Muscle weakness (generalized)     Problem List Patient Active Problem List   Diagnosis Date Noted  . Shortness of breath 05/30/2019  . Colicky RUQ abdominal pain 03/28/2018  . Fatty liver disease, nonalcoholic 37/94/4461  . Gallbladder sludge 03/28/2018  . Atypical chest pain 01/22/2016  . Hyperglycemia 07/23/2014  . Elevated LFTs 04/25/2014  . History of colonoscopy 03/28/2014  . Hypertension 03/02/2014  . Hyperlipidemia 03/02/2014  . Depression, major, single episode, in partial remission ([place]) 03/02/2014    Bryleigh Ottaway,PT 06/20/2020, 10:42 AM  [facility] [place], [place], [address] Phone: [phone]   Fax:  [phone]  Name: Nicole Osborne MRN: [id number] Date of Birth: 09-12-1960

## 2020-06-25 ENCOUNTER — Encounter: Payer: Self-pay | Admitting: Rehabilitative and Restorative Service Providers"

## 2020-06-25 ENCOUNTER — Other Ambulatory Visit: Payer: Self-pay

## 2020-06-25 ENCOUNTER — Ambulatory Visit (INDEPENDENT_AMBULATORY_CARE_PROVIDER_SITE_OTHER): Payer: 59 | Admitting: Rehabilitative and Restorative Service Providers"

## 2020-06-25 DIAGNOSIS — M25551 Pain in right hip: Secondary | ICD-10-CM | POA: Diagnosis not present

## 2020-06-25 DIAGNOSIS — M546 Pain in thoracic spine: Secondary | ICD-10-CM

## 2020-06-25 DIAGNOSIS — M6281 Muscle weakness (generalized): Secondary | ICD-10-CM

## 2020-06-25 NOTE — Patient Instructions (Signed)
Access Code: [email or web address] URL: [email or web address] Date: 06/25/2020 Prepared by: Rudell Cobb  Exercises Prone Quadriceps Stretch with Strap - 2 x daily - 7 x weekly - 1 sets - 3 reps - 30 seconds hold Sidelying Open Book Thoracic Lumbar Rotation and Extension - 2 x daily - 7 x weekly - 1 sets - 10 reps Supine Thoracic Mobilization Towel Roll Horizontal with Arm Stretch - 2 x daily - 7 x weekly - 1 sets - 1 reps - 1-2 minutes hold Corner Pec Major Stretch - 2 x daily - 7 x weekly - 1 sets - 3 reps - 30 sec hold Standing with Forearms Thoracic Rotation - 2 x daily - 7 x weekly - 1 sets - 10 reps Seated Shoulder Horizontal Abduction with Resistance - Thumbs Up - 2 x daily - 7 x weekly - 1 sets - 10 reps Seated Thoracic Extension and Rotation with Reach - 2 x daily - 7 x weekly - 1 sets - 5 reps Staggered Stance Scapular Retraction and Depression with Resistance - 2 x daily - 7 x weekly - 1 sets - 10 reps

## 2020-06-25 NOTE — Therapy (Signed)
[place], [place], [address] Phone: [phone]   Fax:  [phone]  Physical Therapy Treatment  Patient Details  Name: Nicole Osborne MRN: [id number] Date of Birth: October 15, 1960 Referring Provider (PT): Emeterio Reeve, DO   Encounter Date: 06/25/2020   PT End of Session - 06/25/20 0809    Visit Number 5    Number of Visits 12    Date for PT Re-Evaluation 07/23/20    Authorization Type [facility]    PT Start Time 0803    PT Stop Time 0845    PT Time Calculation (min) 42 min    Activity Tolerance Patient tolerated treatment well    Behavior During Therapy [facility] for tasks assessed/performed           Past Medical History:  Diagnosis Date  . Anemia   . Colon polyp   . Depression   . Diverticulosis   . Gallbladder sludge   . Hypertension   . Hypertriglyceridemia     Past Surgical History:  Procedure Laterality Date  . BREAST BIOPSY Right   . BREAST EXCISIONAL BIOPSY Right 2012  . CESAREAN SECTION     x 3  . DILATION AND CURETTAGE OF UTERUS    . TUBAL LIGATION      There were no vitals filed for this visit.   Subjective Assessment - 06/25/20 0805    Subjective The patient was busy over the weekend due to a wedding shower.  She also drove 7 hours in the car.  She is more sore today.    Patient Stated Goals pain free, better movement    Currently in Pain? Yes    Pain Score 8     Pain Location Back    Pain Orientation Mid    Pain Descriptors / Indicators Aching    Pain Type Chronic pain    Pain Onset More than a month ago    Pain Frequency Intermittent    Pain Relieving Factors repositioning              [facility] PT Assessment - 06/25/20 0810      Assessment   Medical Diagnosis trochanteric bursisit of the right hip; mid back pain    Referring Provider (PT) Emeterio Reeve, DO    Onset Date/Surgical Date 06/01/20                         [facility]  Adult PT Treatment/Exercise - 06/25/20 0810      Self-Care   Self-Care Other Self-Care Comments    Other Self-Care Comments  tennis ball roll for rhomboid STM      Exercises   Exercises Neck;Lumbar      Neck Exercises: Standing   Other Standing Exercises --      Neck Exercises: Seated   Lateral Flexion Right;Left    Lateral Flexion Limitations stretching    Shoulder ABduction Both;10 reps   2 sets   Shoulder Abduction Limitations red band    Other Seated Exercise seated thoracic rotation    Other Seated Exercise seated horizontal shoulder abduction x 10 reps, then added red band 2 sets x 10 reps      Neck Exercises: Supine   Shoulder ABduction Both;5 reps    Shoulder Abduction Limitations over foam roller for greater ROM    Other Supine Exercise supine foam roller thoracic mobilization into extension    Other Supine Exercise foam roller  vertical  direction with chest opening for stretch      Neck Exercises: Prone   Other Prone Exercise prone on elbows alternating UE reaching for thoracic mobility    Other Prone Exercise thoracic extension reaching towards feet      Lumbar Exercises: Standing   Row Strengthening;Both;10 reps    Theraband Level (Row) Level 2 (Red)      Lumbar Exercises: Quadruped   [place]/Old Horse 10 reps    Opposite Arm/Leg Raise Right arm/Left leg;Left arm/Right leg;5 reps    Other Quadruped Lumbar Exercises q-ped trunk rotation R and L sides c 5 reps      Shoulder Exercises: Prone   Other Prone Exercises elbow propping for thoracic extension with alternating UE reaching      Manual Therapy   Manual Therapy Joint mobilization;Soft tissue mobilization    Manual therapy comments self mobilization with foam roller; to decrease pain and improve mobility    Joint Mobilization thoracic spine prone CPA and [employer] grade II-III for pain and for mobility; mobilization with movement into thoracic extension    Soft tissue mobilization thoracic paraspinal STM and  rhomboids                  PT Education - 06/25/20 0831    Education Details HEP    Person(s) Educated Patient    Methods Explanation;Demonstration;Handout    Comprehension Verbalized understanding;Returned demonstration               PT Long Term Goals - 06/25/20 0831      PT LONG TERM GOAL #1   Title The patient will be indep with HEP for R hip strength, thoracic mobility, LE flexibility.    Time 6    Period Weeks      PT LONG TERM GOAL #2   Title The patient will improve functional status score from 63% up to 69%.    Time 6    Period Weeks      PT LONG TERM GOAL #3   Title The patient will report mid back pain < or equal to 2/10.    Time 6    Period Weeks      PT LONG TERM GOAL #4   Title The patient will report no pain with sidelying in the bed in the R hip.    Time 6    Period Weeks    Status Achieved      PT LONG TERM GOAL #5   Title The patient will improve LE strength to 5/5 for R hip abduction and extension.    Time 6    Period Weeks                 Plan - 06/25/20 1012    Clinical Impression Statement The patient's pain was worse upon arrival today after driving 7 hours yesterday and spending hours in the kitchen prepping food over the weekend.  PT focused on mobilization of thoracic spine with improved motion mid t-spine and continued tightness T3-T4 with paraspinal musculature tenderness.  Plan to progress to patient tolerance.    PT Treatment/Interventions ADLs/Self Care Home Management;Taping;Patient/family education;Dry needling;Manual techniques;Therapeutic exercise;Therapeutic activities;Iontophoresis 4mg /ml Dexamethasone;Moist Heat;Electrical Stimulation;Cryotherapy;Aquatic Therapy    PT Next Visit Plan thoracic mobilization (manual and mobilization with movement), STM/DN for soft tissue restrictions, focus on t-spine ther ex as hip improved, progress HEP, modalities for pain prn    PT Home Exercise Plan [email or web address]    Consulted and  Agree with Plan of Care Patient  Patient will benefit from skilled therapeutic intervention in order to improve the following deficits and impairments:     Visit Diagnosis: Pain in thoracic spine  Pain in right hip  Muscle weakness (generalized)     Problem List Patient Active Problem List   Diagnosis Date Noted  . Shortness of breath 05/30/2019  . Colicky RUQ abdominal pain 03/28/2018  . Fatty liver disease, nonalcoholic 15/94/5859  . Gallbladder sludge 03/28/2018  . Atypical chest pain 01/22/2016  . Hyperglycemia 07/23/2014  . Elevated LFTs 04/25/2014  . History of colonoscopy 03/28/2014  . Hypertension 03/02/2014  . Hyperlipidemia 03/02/2014  . Depression, major, single episode, in partial remission ([place]) 03/02/2014    [place], PT 06/25/2020, 10:15 AM  [facility] [place], [place], [address] Phone: [phone]   Fax:  [phone]  Name: Nicole Osborne MRN: [id number] Date of Birth: 03/18/1961

## 2020-06-28 ENCOUNTER — Encounter: Payer: 59 | Admitting: Physical Therapy

## 2020-07-01 ENCOUNTER — Encounter: Payer: 59 | Admitting: Physical Therapy

## 2020-07-02 ENCOUNTER — Encounter: Payer: Self-pay | Admitting: Osteopathic Medicine

## 2020-07-02 ENCOUNTER — Other Ambulatory Visit: Payer: Self-pay

## 2020-07-02 ENCOUNTER — Other Ambulatory Visit (HOSPITAL_BASED_OUTPATIENT_CLINIC_OR_DEPARTMENT_OTHER): Payer: Self-pay

## 2020-07-02 ENCOUNTER — Ambulatory Visit (INDEPENDENT_AMBULATORY_CARE_PROVIDER_SITE_OTHER): Payer: 59 | Admitting: Osteopathic Medicine

## 2020-07-02 ENCOUNTER — Ambulatory Visit (INDEPENDENT_AMBULATORY_CARE_PROVIDER_SITE_OTHER): Payer: 59

## 2020-07-02 VITALS — BP 140/64 | HR 72 | Temp 97.7°F | Wt 194.0 lb

## 2020-07-02 DIAGNOSIS — M4804 Spinal stenosis, thoracic region: Secondary | ICD-10-CM | POA: Diagnosis not present

## 2020-07-02 DIAGNOSIS — M546 Pain in thoracic spine: Secondary | ICD-10-CM | POA: Diagnosis not present

## 2020-07-02 DIAGNOSIS — M7918 Myalgia, other site: Secondary | ICD-10-CM

## 2020-07-02 DIAGNOSIS — M4184 Other forms of scoliosis, thoracic region: Secondary | ICD-10-CM | POA: Diagnosis not present

## 2020-07-02 MED ORDER — CYCLOBENZAPRINE HCL 10 MG PO TABS
5.0000 mg | ORAL_TABLET | Freq: Three times a day (TID) | ORAL | 1 refills | Status: DC | PRN
  Filled 2020-07-02: qty 60, 20d supply, fill #0

## 2020-07-02 MED ORDER — PREDNISONE 20 MG PO TABS
20.0000 mg | ORAL_TABLET | Freq: Two times a day (BID) | ORAL | 0 refills | Status: DC
  Filled 2020-07-02: qty 10, 5d supply, fill #0

## 2020-07-02 NOTE — Patient Instructions (Addendum)
Plan: Xray today See printed instructions for home exercises / stretches Other prescriptions:  Steroids (predisone) burst for 5 days  Anti-inflammatory (meloxicam) take daily for 5-7 days then can keep on had for use as-needed after that  Muscle relaxer (cyclobenzaprine) as needed for muscle spasm - may help to take in the evening / bedtime  Continue physical therapy - applying heat prior to PT appointments may help Please contact our office for sports medicine appointment with Dr T / orthopedics if medications and PT still not helping

## 2020-07-02 NOTE — Progress Notes (Signed)
Nicole Osborne is a 60 y.o. female who presents to  [place] at [facility]  today, 07/02/20, seeking care for the following:  . Concern for right-sided upper back pain, has really been bothering her over the course of the past week or so.  No previous injury to this area.  No numbness/tingling down into the arm, no neck pain.     ASSESSMENT & PLAN with other pertinent findings:  The primary encounter diagnosis was Acute right-sided thoracic back pain. A diagnosis of Rhomboid pain was also pertinent to this visit.    Patient Instructions  Plan: Xray today See printed instructions for home exercises / stretches Other prescriptions:  Steroids (predisone) burst for 5 days  Anti-inflammatory (meloxicam) take daily for 5-7 days then can keep on had for use as-needed after that  Muscle relaxer (cyclobenzaprine) as needed for muscle spasm - may help to take in the evening / bedtime  Continue physical therapy - applying heat prior to PT appointments may help Please contact our office for sports medicine appointment with Dr T / orthopedics if medications and PT still not helping       Orders Placed This Encounter  Procedures  . DG Thoracic Spine W/Swimmers   DG Thoracic Spine W/Swimmers  Result Date: 07/03/2020 CLINICAL DATA:  Dorsalgia EXAM: THORACIC SPINE - 3 VIEWS COMPARISON:  None. FINDINGS: Standing frontal, standing lateral, and standing swimmer's views were obtained. There is slight thoracolumbar levoscoliosis. There is no fracture or spondylolisthesis. There is mild to moderate disc space narrowing at several levels in the upper to mid thoracic region. No erosive change or paraspinous lesion. Visualized lungs clear. IMPRESSION: Negative slight lower thoracic scoliosis. Disc space narrowing at several levels noted. No fracture or spondylolisthesis. Electronically Signed   By: Lowella Grip III M.D.   On: 07/03/2020 08:32   DG  Knee Complete 4 Views Right  Result Date: 06/07/2020 CLINICAL DATA:  Chronic intermittent right knee pain. EXAM: RIGHT KNEE - COMPLETE 4+ VIEW COMPARISON:  None. FINDINGS: No acute fracture or dislocation. No joint effusion. Mild tricompartmental joint space narrowing with small marginal osteophytes. Bone mineralization is normal. Soft tissues are unremarkable. IMPRESSION: 1. Mild tricompartmental osteoarthritis. Electronically Signed   By: Titus Dubin M.D.   On: 06/07/2020 09:22   MM 3D SCREEN BREAST BILATERAL  Result Date: 05/03/2020 CLINICAL DATA:  Screening. EXAM: DIGITAL SCREENING BILATERAL MAMMOGRAM WITH TOMOSYNTHESIS AND CAD COMPARISON:  Previous exam(s). ACR Breast Density Category b: There are scattered areas of fibroglandular density. FINDINGS: There are no findings suspicious for malignancy. The images were evaluated with computer-aided detection. IMPRESSION: No mammographic evidence of malignancy. A result letter of this screening mammogram will be mailed directly to the patient. RECOMMENDATION: Screening mammogram in one year. (Code:SM-B-01Y) BI-RADS CATEGORY  1: Negative. Electronically Signed   By: Ammie Ferrier M.D.   On: 05/03/2020 15:15   DG Hip Unilat W OR W/O Pelvis 2-3 Views Right  Result Date: 06/07/2020 CLINICAL DATA:  Lateral right hip pain. EXAM: DG HIP (WITH OR WITHOUT PELVIS) 2-3V RIGHT COMPARISON:  None. FINDINGS: There is no evidence of hip fracture or dislocation. There is no evidence of arthropathy or other focal bone abnormality. Degenerative sclerosis of the pubic symphysis. Soft tissues are unremarkable. IMPRESSION: 1. Negative. Electronically Signed   By: Titus Dubin M.D.   On: 06/07/2020 09:21    Meds ordered this encounter  Medications  . cyclobenzaprine (FLEXERIL) 10 MG tablet    Sig:  Take 1/2 - 1 tablet (5-10 mg total) by mouth 3 (three) times daily as needed for muscle spasms. Caution: can cause drowsiness    Dispense:  60 tablet    Refill:  1  .  predniSONE (DELTASONE) 20 MG tablet    Sig: Take 1 tablet (20 mg total) by mouth 2 (two) times daily with a meal.    Dispense:  10 tablet    Refill:  0     See below for relevant physical exam findings  See below for recent lab and imaging results reviewed  Medications, allergies, PMH, PSH, SocH, FamH reviewed below    Follow-up instructions: Return for VISIT WITH SPORTS MEDICINE FOR ORTHOPEDIC ISSUES IF NEEDED .                                        Exam:  BP 140/64 (BP Location: Left Arm, Patient Position: Sitting, Cuff Size: Large)   Pulse 72   Temp 97.7 F (36.5 C) (Oral)   Wt 194 lb 0.6 oz (88 kg)   BMI 33.31 kg/m   Constitutional: VS see above. General Appearance: alert, well-developed, well-nourished, NAD  Neck: No masses, trachea midline.   Respiratory: Normal respiratory effort.   Musculoskeletal: Gait normal. Symmetric and independent movement of all extremities. (+) Tenderness on right rhomboid, consistent with muscle spasm.  Abdominal: non-tender, non-distended, no appreciable organomegaly, neg Murphy's, BS WNLx4  Neurological: Normal balance/coordination. No tremor.  Skin: warm, dry, intact.   Psychiatric: Normal judgment/insight. Normal mood and affect. Oriented x3.   Current Meds  Medication Sig  . atorvastatin (LIPITOR) 20 MG tablet TAKE 1 TABLET (20 MG TOTAL) BY MOUTH DAILY.  Marland Kitchen CALCIUM PO Take by mouth.  . cyclobenzaprine (FLEXERIL) 10 MG tablet Take 1/2 - 1 tablet (5-10 mg total) by mouth 3 (three) times daily as needed for muscle spasms. Caution: can cause drowsiness  . meloxicam (MOBIC) 15 MG tablet TAKE 1 TABLET (15 MG TOTAL) BY MOUTH DAILY.  Marland Kitchen predniSONE (DELTASONE) 20 MG tablet Take 1 tablet (20 mg total) by mouth 2 (two) times daily with a meal.    No Known Allergies  Patient Active Problem List   Diagnosis Date Noted  . Shortness of breath 05/30/2019  . Colicky RUQ abdominal pain 03/28/2018  . Fatty  liver disease, nonalcoholic 30/09/6224  . Gallbladder sludge 03/28/2018  . Atypical chest pain 01/22/2016  . Hyperglycemia 07/23/2014  . Elevated LFTs 04/25/2014  . History of colonoscopy 03/28/2014  . Hypertension 03/02/2014  . Hyperlipidemia 03/02/2014  . Depression, major, single episode, in partial remission ([place]) 03/02/2014    Family History  Problem Relation Age of Onset  . Hypertension Mother   . Diabetes Mother   . Lung cancer Father   . Breast cancer Sister 74  . Leukemia Sister   . Colon cancer Maternal Grandmother   . Lung cancer Sister   . CAD Neg Hx     Social History   Tobacco Use  Smoking Status Never Smoker  Smokeless Tobacco Never Used    Past Surgical History:  Procedure Laterality Date  . BREAST BIOPSY Right   . BREAST EXCISIONAL BIOPSY Right 2012  . CESAREAN SECTION     x 3  . DILATION AND CURETTAGE OF UTERUS    . TUBAL LIGATION      Immunization History  Administered Date(s) Administered  . Influenza-Unspecified 12/29/2014, 12/29/2015, 12/28/2016, 01/28/2019  .  PFIZER(Purple Top)SARS-COV-2 Vaccination 05/24/2019, 06/21/2019  . Zoster Recombinat (Shingrix) 10/12/2018, 04/12/2019    No results found for this or any previous visit (from the past 2160 hour(s)).      All questions at time of visit were answered - patient instructed to contact office with any additional concerns or updates. ER/RTC precautions were reviewed with the patient as applicable.   Please note: manual typing as well as voice recognition software may have been used to produce this document - typos may escape review. Please contact Dr. Sheppard Coil for any needed clarifications.

## 2020-07-03 ENCOUNTER — Ambulatory Visit (INDEPENDENT_AMBULATORY_CARE_PROVIDER_SITE_OTHER): Payer: 59 | Admitting: Rehabilitative and Restorative Service Providers"

## 2020-07-03 DIAGNOSIS — M6281 Muscle weakness (generalized): Secondary | ICD-10-CM

## 2020-07-03 DIAGNOSIS — M546 Pain in thoracic spine: Secondary | ICD-10-CM | POA: Diagnosis not present

## 2020-07-03 NOTE — Therapy (Signed)
[place], [place], [address] Phone: [phone]   Fax:  [phone]  Physical Therapy Treatment  Patient Details  Name: NEVEYAH GARZON MRN: [id number] Date of Birth: 1960/11/11 Referring Provider (PT): Emeterio Reeve, DO   Encounter Date: 07/03/2020   PT End of Session - 07/03/20 0810    Visit Number 6    Number of Visits 12    Date for PT Re-Evaluation 07/23/20    Authorization Type [facility]    PT Start Time 0804    PT Stop Time 0834    PT Time Calculation (min) 30 min    Activity Tolerance Patient tolerated treatment well    Behavior During Therapy [facility] for tasks assessed/performed           Past Medical History:  Diagnosis Date  . Anemia   . Colon polyp   . Depression   . Diverticulosis   . Gallbladder sludge   . Hypertension   . Hypertriglyceridemia     Past Surgical History:  Procedure Laterality Date  . BREAST BIOPSY Right   . BREAST EXCISIONAL BIOPSY Right 2012  . CESAREAN SECTION     x 3  . DILATION AND CURETTAGE OF UTERUS    . TUBAL LIGATION      There were no vitals filed for this visit.   Subjective Assessment - 07/03/20 0806    Subjective The patient reports she was out of breath from pain on Friday and Monday.  Yesterday was much better.  She hurts most with turns and twisting.    Patient Stated Goals pain free, better movement    Currently in Pain? Yes    Pain Score 5    None at rest; 5/10 getting into the car;   Pain Location Back    Pain Orientation Mid    Pain Descriptors / Indicators Aching;Sharp    Pain Type Chronic pain    Pain Onset More than a month ago    Pain Frequency Intermittent    Pain Score 0    Pain Location Leg                             [facility] Adult PT Treatment/Exercise - 07/03/20 0812      Exercises   Exercises Neck;Lumbar      Neck Exercises: Machines for Strengthening   UBE (Upper Arm Bike) L2 x 2  minutes forward, 1 minute back      Neck Exercises: Standing   Other Standing Exercises standing L and W x 12 reps    Other Standing Exercises Row x 10 reps red band with elbows flexed, and then pull downs emphasizing scapular depression x 10 reps with red band      Shoulder Exercises: Prone   Retraction Strengthening;Both;10 reps    Other Prone Exercises Prone row x 10 reps bilaterally      Manual Therapy   Manual Therapy Soft tissue mobilization    Manual therapy comments worked with soft tissue today-- no joint mobs due to recent imaging ordered-- awaiting results    Soft tissue mobilization rhomboid and upper trap soft tissue mobilization            Trigger Point Dry Needling - 07/03/20 0818    Consent Given? Yes    Education Handout Provided Previously provided    Muscles Treated Head and Neck Upper trapezius;Levator scapulae    Muscles Treated  Upper Quadrant Rhomboids    Dry Needling Comments bilaterally    Upper Trapezius Response Twitch reponse elicited;Palpable increased muscle length    Levator Scapulae Response Twitch response elicited;Palpable increased muscle length    Rhomboids Response Palpable increased muscle length                     PT Long Term Goals - 06/25/20 0831      PT LONG TERM GOAL #1   Title The patient will be indep with HEP for R hip strength, thoracic mobility, LE flexibility.    Time 6    Period Weeks      PT LONG TERM GOAL #2   Title The patient will improve functional status score from 63% up to 69%.    Time 6    Period Weeks      PT LONG TERM GOAL #3   Title The patient will report mid back pain < or equal to 2/10.    Time 6    Period Weeks      PT LONG TERM GOAL #4   Title The patient will report no pain with sidelying in the bed in the R hip.    Time 6    Period Weeks    Status Achieved      PT LONG TERM GOAL #5   Title The patient will improve LE strength to 5/5 for R hip abduction and extension.    Time 6     Period Weeks                 Plan - 07/03/20 0840    Clinical Impression Statement The patient saw MD due to continued intermittent pain in thoracic region-- it was worse on Monday, somewhat improved yesterday and today.  She does not have pain at rest, but does get pain with rolling, getting into car, getting out of bed.  PT treated more conservatively today due to recent imaging results not yet in chart.  Worked with soft tissue avoiding jointmobs.  Plan to continue to LTGs as pain allows.    PT Treatment/Interventions ADLs/Self Care Home Management;Taping;Patient/family education;Dry needling;Manual techniques;Therapeutic exercise;Therapeutic activities;Iontophoresis 4mg /ml Dexamethasone;Moist Heat;Electrical Stimulation;Cryotherapy;Aquatic Therapy    PT Next Visit Plan * CHECK IMAGING results first--thoracic mobilization (manual and mobilization with movement), STM/DN for soft tissue restrictions, focus on t-spine ther ex as hip improved, progress HEP, modalities for pain prn    PT Home Exercise Plan [id number]    Consulted and Agree with Plan of Care Patient           Patient will benefit from skilled therapeutic intervention in order to improve the following deficits and impairments:     Visit Diagnosis: Pain in thoracic spine  Muscle weakness (generalized)     Problem List Patient Active Problem List   Diagnosis Date Noted  . Shortness of breath 05/30/2019  . Colicky RUQ abdominal pain 03/28/2018  . Fatty liver disease, nonalcoholic 39/05/90  . Gallbladder sludge 03/28/2018  . Atypical chest pain 01/22/2016  . Hyperglycemia 07/23/2014  . Elevated LFTs 04/25/2014  . History of colonoscopy 03/28/2014  . Hypertension 03/02/2014  . Hyperlipidemia 03/02/2014  . Depression, major, single episode, in partial remission ([place]) 03/02/2014    [place], [place] 07/03/2020, 8:42 AM  [facility] [place], [place], [address] Phone: [phone]   Fax:  [phone]  Name: EVIN CHIRCO MRN: [id number] Date of Birth: 03-16-1961

## 2020-07-05 ENCOUNTER — Other Ambulatory Visit: Payer: Self-pay

## 2020-07-05 ENCOUNTER — Ambulatory Visit (INDEPENDENT_AMBULATORY_CARE_PROVIDER_SITE_OTHER): Payer: 59 | Admitting: Physical Therapy

## 2020-07-05 ENCOUNTER — Encounter: Payer: Self-pay | Admitting: Physical Therapy

## 2020-07-05 DIAGNOSIS — M546 Pain in thoracic spine: Secondary | ICD-10-CM

## 2020-07-05 DIAGNOSIS — M25551 Pain in right hip: Secondary | ICD-10-CM

## 2020-07-05 DIAGNOSIS — M6281 Muscle weakness (generalized): Secondary | ICD-10-CM | POA: Diagnosis not present

## 2020-07-05 NOTE — Therapy (Signed)
[place], [place], [address] Phone: [phone]   Fax:  [phone]  Physical Therapy Treatment  Patient Details  Name: Nicole Osborne MRN: [id number] Date of Birth: March 18, 1961 Referring Provider (PT): Emeterio Reeve, DO   Encounter Date: 07/05/2020   PT End of Session - 07/05/20 0802    Visit Number 7    Number of Visits 12    Date for PT Re-Evaluation 07/23/20    Authorization Type [facility]    PT Start Time 0802    PT Stop Time 0848    PT Time Calculation (min) 46 min           Past Medical History:  Diagnosis Date  . Anemia   . Colon polyp   . Depression   . Diverticulosis   . Gallbladder sludge   . Hypertension   . Hypertriglyceridemia     Past Surgical History:  Procedure Laterality Date  . BREAST BIOPSY Right   . BREAST EXCISIONAL BIOPSY Right 2012  . CESAREAN SECTION     x 3  . DILATION AND CURETTAGE OF UTERUS    . TUBAL LIGATION      There were no vitals filed for this visit.   Subjective Assessment - 07/05/20 0807    Subjective Pt reports she was started on Prednisone 3 days ago.  This has helped; pain is more intermittent instead of constant.  She was able to walk around the block last night without a lot of pain. Hip has been fine.    Patient Stated Goals pain free, better movement    Currently in Pain? Yes    Pain Score 4     Pain Location Thoracic    Pain Orientation Mid    Pain Descriptors / Indicators Aching    Pain Onset More than a month ago    Aggravating Factors  getting in/out of car (turning / twisting)    Pain Relieving Factors repositioning.              [facility] PT Assessment - 07/05/20 0001      Assessment   Medical Diagnosis trochanteric bursisit of the right hip; mid back pain    Referring Provider (PT) Emeterio Reeve, DO    Onset Date/Surgical Date 06/01/20    Prior Therapy none             [facility] Adult PT  Treatment/Exercise - 07/05/20 0001      Self-Care   Self-Care Posture;Other Self-Care Comments    Posture Encouraged pt to use lumbar support when driving for improved posture/ decreased back pain.    Other Self-Care Comments  Encouraged to trial "sit and swivel" to get in/out of car with less twisting of spine.      Neck Exercises: Machines for Strengthening   UBE (Upper Arm Bike) L1: 1.5 min each direction      Lumbar Exercises: Standing   Other Standing Lumbar Exercises standing L stretch holding standing desk x 10 sec      Lumbar Exercises: Supine   Other Supine Lumbar Exercises bilat horiz abdct with green band x 10 reps, Diagonals with green band x 10 each side.  bilat shoulder ER with green band x 10 reps      Lumbar Exercises: Sidelying   Other Sidelying Lumbar Exercises open book x 5 reps each side, with hand behind head.      Lumbar Exercises: Prone   Opposite Arm/Leg Raise Right arm/Left  leg;Left arm/Right leg;10 reps   with axial ext     Lumbar Exercises: Quadruped   Madcat/Old Horse 5 reps   and wag the tail.   Other Quadruped Lumbar Exercises childs pose x 2 reps      Moist Heat Therapy   Number Minutes Moist Heat 10 Minutes    Moist Heat Location --   thoracic     Electrical Stimulation   Electrical Stimulation Location mid thoracic paraspinals    Electrical Stimulation Action IFC    Electrical Stimulation Parameters 10 min, intensity to tolerance    Electrical Stimulation Goals Pain                       PT Long Term Goals - 06/25/20 0831      PT LONG TERM GOAL #1   Title The patient will be indep with HEP for R hip strength, thoracic mobility, LE flexibility.    Time 6    Period Weeks      PT LONG TERM GOAL #2   Title The patient will improve functional status score from 63% up to 69%.    Time 6    Period Weeks      PT LONG TERM GOAL #3   Title The patient will report mid back pain < or equal to 2/10.    Time 6    Period Weeks       PT LONG TERM GOAL #4   Title The patient will report no pain with sidelying in the bed in the R hip.    Time 6    Period Weeks    Status Achieved      PT LONG TERM GOAL #5   Title The patient will improve LE strength to 5/5 for R hip abduction and extension.    Time 6    Period Weeks                 Plan - 07/05/20 4034    Clinical Impression Statement Pt tolerated exercises well without increase in pain. Discussed self care and body mechanics to consider to keep pain level lower.  Pt reported elimination of pain by end of session. Progressing towards goals.    PT Treatment/Interventions ADLs/Self Care Home Management;Taping;Patient/family education;Dry needling;Manual techniques;Therapeutic exercise;Therapeutic activities;Iontophoresis 4mg /ml Dexamethasone;Moist Heat;Electrical Stimulation;Cryotherapy;Aquatic Therapy    PT Next Visit Plan MMT Rt hip.  continue postural strengthening.    PT Home Exercise Plan [id number]    Consulted and Agree with Plan of Care Patient           Patient will benefit from skilled therapeutic intervention in order to improve the following deficits and impairments:     Visit Diagnosis: Pain in thoracic spine  Muscle weakness (generalized)  Pain in right hip     Problem List Patient Active Problem List   Diagnosis Date Noted  . Shortness of breath 05/30/2019  . Colicky RUQ abdominal pain 03/28/2018  . Fatty liver disease, nonalcoholic 74/25/9563  . Gallbladder sludge 03/28/2018  . Atypical chest pain 01/22/2016  . Hyperglycemia 07/23/2014  . Elevated LFTs 04/25/2014  . History of colonoscopy 03/28/2014  . Hypertension 03/02/2014  . Hyperlipidemia 03/02/2014  . Depression, major, single episode, in partial remission ([place]) 03/02/2014   Nicole Osborne, PTA 07/05/20 10:40 AM  [place], [place], [address] Phone: [phone]   Fax:   [phone]  Name: Nicole Osborne MRN: [id number] Date of Birth:  10/22/1960   

## 2020-07-09 ENCOUNTER — Other Ambulatory Visit: Payer: Self-pay

## 2020-07-09 ENCOUNTER — Ambulatory Visit (INDEPENDENT_AMBULATORY_CARE_PROVIDER_SITE_OTHER): Payer: 59 | Admitting: Physical Therapy

## 2020-07-09 DIAGNOSIS — M6281 Muscle weakness (generalized): Secondary | ICD-10-CM

## 2020-07-09 DIAGNOSIS — M546 Pain in thoracic spine: Secondary | ICD-10-CM

## 2020-07-09 NOTE — Therapy (Addendum)
[place], [place], [address] Phone: [phone]   Fax:  [phone]  Physical Therapy Treatment and Discharge Summary  Patient Details  Name: Nicole Osborne MRN: [id number] Date of Birth: [birth year] Referring Provider (PT): Emeterio Reeve, DO   PHYSICAL THERAPY DISCHARGE SUMMARY  Visits from Start of Care: 8  Current functional level related to goals / functional outcomes: See goals below--partially met.   Remaining deficits: See note below for current status--patient did not return.   Education / Equipment: HEP  Plan: Patient agrees to discharge.  Patient goals were partially met. Patient is being discharged due to not returning since the last visit.  ?????         Thank you for the referral of this patient. Rudell Cobb, MPT  Encounter Date: 07/09/2020   PT End of Session - 07/09/20 0834    Visit Number 8    Number of Visits 12    Date for PT Re-Evaluation 07/23/20    Authorization Type [facility]    PT Start Time 0803    PT Stop Time 0835    PT Time Calculation (min) 32 min    Activity Tolerance Patient tolerated treatment well;No increased pain    Behavior During Therapy WFL for tasks assessed/performed           Past Medical History:  Diagnosis Date  . Anemia   . Colon polyp   . Depression   . Diverticulosis   . Gallbladder sludge   . Hypertension   . Hypertriglyceridemia     Past Surgical History:  Procedure Laterality Date  . BREAST BIOPSY Right   . BREAST EXCISIONAL BIOPSY Right 2012  . CESAREAN SECTION     x 3  . DILATION AND CURETTAGE OF UTERUS    . TUBAL LIGATION      There were no vitals filed for this visit.   Subjective Assessment - 07/09/20 0804    Subjective Pt has finished prednisone.  She complains of tightness, but no pain this morning.  She tried some of the stretches during her work day.    Currently in Pain? No/denies     Pain Score 0-No pain              [facility] PT Assessment - 07/09/20 0001      Assessment   Medical Diagnosis trochanteric bursisit of the right hip; mid back pain    Referring Provider (PT) Emeterio Reeve, DO    Onset Date/Surgical Date 06/01/20    Prior Therapy none      Strength   Right Hip Extension 5/5    Right Hip ABduction 5/5            [facility] Adult PT Treatment/Exercise - 07/09/20 0001      Self-Care   Other Self-Care Comments  reviewed self massage with ball to midback musculature; pt returned demo with cues      Neck Exercises: Machines for Strengthening   UBE (Upper Arm Bike) L4: 1 min each direction      Lumbar Exercises: Standing   Other Standing Lumbar Exercises Thoracic rotation with forearms on wall x 4 reps each side.      Lumbar Exercises: Sidelying   Other Sidelying Lumbar Exercises open book x 10 reps each side, with hand behind head.      Lumbar Exercises: Prone   Opposite Arm/Leg Raise Right arm/Left leg;Left arm/Right leg;5 reps   2 sets, with axial ext  Other Prone Lumbar Exercises POE:  cervical flexion and levator stretch each direction.      Lumbar Exercises: Quadruped   Madcat/Old Horse 5 reps   and wag the tail.   Other Quadruped Lumbar Exercises childs pose x 2 reps then moving to POE x 2     Shoulder Exercises: Seated   Other Seated Exercises seated bilat shoulder flex with thoracic rotation (per HEP) x 5 reps each side.      Shoulder Exercises: Standing   Extension Strengthening;Both;5 reps    Theraband Level (Shoulder Extension) Level 3 (Green)    Row Strengthening;Both;10 reps    Theraband Level (Shoulder Row) Level 3 (Green)      Shoulder Exercises: Psychologist, sport and exercise 1 rep;20 seconds   and knees LTR x 1   Other Shoulder Stretches midlevel and high doorway stretch x 15 sec x 3 reps each.  low door bicep streth    Other Shoulder Stretches thoracic ext over small towel (at bra strap) with hands behind head (limited  tolerance)                       PT Long Term Goals - 07/09/20 0815      PT LONG TERM GOAL #1   Title The patient will be indep with HEP for R hip strength, thoracic mobility, LE flexibility.    Time 6    Period Weeks    Status On-going      PT LONG TERM GOAL #2   Title The patient will improve functional status score from 63% up to 69%.    Time 6    Period Weeks      PT LONG TERM GOAL #3   Title The patient will report mid back pain < or equal to 2/10.    Time 6    Period Weeks    Status On-going      PT LONG TERM GOAL #4   Title The patient will report no pain with sidelying in the bed in the R hip.    Time 6    Period Weeks    Status Achieved      PT LONG TERM GOAL #5   Title The patient will improve LE strength to 5/5 for R hip abduction and extension.    Time 6    Period Weeks    Status Achieved                 Plan - 07/09/20 7902    Clinical Impression Statement Pt tolerated all exercises well, reporting decreased stiffness afterward. Minor cues for head position and posture during exercises.  Pt has met her Rt hip goals.  Pt will work on HEP/self care and return in 2 wks to follow up/finalize HEP.    Rehab Potential Good    PT Frequency 2x / week    PT Duration 6 weeks    PT Treatment/Interventions ADLs/Self Care Home Management;Taping;Patient/family education;Dry needling;Manual techniques;Therapeutic exercise;Therapeutic activities;Iontophoresis 51m/ml Dexamethasone;Moist Heat;Electrical Stimulation;Cryotherapy;Aquatic Therapy    PT Next Visit Plan continue postural strengthening.    PT Home Exercise Plan [id number]    Consulted and Agree with Plan of Care Patient           Patient will benefit from skilled therapeutic intervention in order to improve the following deficits and impairments:  Hypomobility,Impaired flexibility,Pain,Decreased strength,Decreased mobility,Postural dysfunction,Improper body mechanics,Decreased range of  motion,Increased fascial restricitons  Visit Diagnosis: Pain in thoracic spine  Muscle weakness (  generalized)     Problem List Patient Active Problem List   Diagnosis Date Noted  . Shortness of breath 05/30/2019  . Colicky RUQ abdominal pain 03/28/2018  . Fatty liver disease, nonalcoholic 29/52/8413  . Gallbladder sludge 03/28/2018  . Atypical chest pain 01/22/2016  . Hyperglycemia 07/23/2014  . Elevated LFTs 04/25/2014  . History of colonoscopy 03/28/2014  . Hypertension 03/02/2014  . Hyperlipidemia 03/02/2014  . Depression, major, single episode, in partial remission ([place]) 03/02/2014   Kerin Perna, PTA 07/09/20 8:42 AM  [place], [place], [address] Phone: [phone]   Fax:  [phone]  Name: Nicole Osborne MRN: [id number] Date of Birth: 1960-04-09

## 2020-07-26 ENCOUNTER — Other Ambulatory Visit (HOSPITAL_BASED_OUTPATIENT_CLINIC_OR_DEPARTMENT_OTHER): Payer: Self-pay

## 2020-07-26 MED FILL — Lisinopril & Hydrochlorothiazide Tab 20-25 MG: ORAL | 90 days supply | Qty: 45 | Fill #0 | Status: AC

## 2020-08-13 ENCOUNTER — Ambulatory Visit (INDEPENDENT_AMBULATORY_CARE_PROVIDER_SITE_OTHER): Payer: 59

## 2020-08-13 ENCOUNTER — Ambulatory Visit: Payer: 59 | Admitting: Family Medicine

## 2020-08-13 ENCOUNTER — Encounter: Payer: Self-pay | Admitting: Family Medicine

## 2020-08-13 ENCOUNTER — Other Ambulatory Visit: Payer: Self-pay

## 2020-08-13 VITALS — BP 137/85 | HR 81 | Temp 97.9°F

## 2020-08-13 DIAGNOSIS — R0789 Other chest pain: Secondary | ICD-10-CM

## 2020-08-13 DIAGNOSIS — R059 Cough, unspecified: Secondary | ICD-10-CM | POA: Diagnosis not present

## 2020-08-13 DIAGNOSIS — R5383 Other fatigue: Secondary | ICD-10-CM

## 2020-08-13 NOTE — Progress Notes (Signed)
Acute Office Visit  Subjective:    Patient ID: Nicole Osborne, female    DOB: 27-Jul-1960, 60 y.o.   MRN: [id number]  Chief Complaint  Patient presents with  . Chest Pain  . Fatigue  . Cough    HPI Patient is in today for chest discomfort, cough, and fatigue.  Patient states that she had COVID in April (out of quarantine on 07/27/20) and she has had some lingering symptoms that are concerning her. Reports she has a history of chest pain with shortness of breath that had a negative workup by cardiology. She reports she has been very busy lately and is not sure if that is impairing her recovery or if she is developing secondary issues.   CHEST DISCOMFORT: Patient has had chest tightness/discomfort/pressure that is difficult to describe, but can be 4/10 discomfort, not necessarily pain. She states it has been occurring multiple times per day and lasting a few minutes to an hour or two. It occurs randomly, and she has not noticed any patterns in regards to meal types/times, activity level. She states it happens every day. She has not had any radiation, diaphoresis, dizziness, lightheadedness, palpitations, tachycardia, dyspnea, pain with deep inspiration. Pain is not reproducible with palpation.   COUGH: Cough has continued since infection, but seems to be slowly easing up. States she is bringing up green mucus, usually just in the morning. She is not having any fevers, wheezing, dyspnea, pain with inspiration.  FATIGUE: Patient is concerned that the fatigue she felt during [place] has persisted for this long. She feels constantly tired. She does her best to get all of the necessities done (work, home etc), but does not have energy for anything extracurricular. Reports she is eating all 3 meals, staying hydrated, sleeping good. No recent weight changes, no signs of bleeding or anemia.      Past Medical History:  Diagnosis Date  . Anemia   . Colon polyp   . Depression   .  Diverticulosis   . Gallbladder sludge   . Hypertension   . Hypertriglyceridemia     Past Surgical History:  Procedure Laterality Date  . BREAST BIOPSY Right   . BREAST EXCISIONAL BIOPSY Right 2012  . CESAREAN SECTION     x 3  . DILATION AND CURETTAGE OF UTERUS    . TUBAL LIGATION      Family History  Problem Relation Age of Onset  . Hypertension Mother   . Diabetes Mother   . Lung cancer Father   . Breast cancer Sister 66  . Leukemia Sister   . Colon cancer Maternal Grandmother   . Lung cancer Sister   . CAD Neg Hx     Social History   Socioeconomic History  . Marital status: Married    Spouse name: Not on file  . Number of children: 3  . Years of education: Not on file  . Highest education level: Not on file  Occupational History  . Occupation: Therapist, sports  Tobacco Use  . Smoking status: Never Smoker  . Smokeless tobacco: Never Used  Substance and Sexual Activity  . Alcohol use: Yes    Comment: occasional  . Drug use: No  . Sexual activity: Not on file  Other Topics Concern  . Not on file  Social History Narrative  . Not on file   Social Determinants of Health   Financial Resource Strain: Not on file  Food Insecurity: Not on file  Transportation Needs: Not on file  Physical Activity: Not on file  Stress: Not on file  Social Connections: Not on file  Intimate Partner Violence: Not on file    Outpatient Medications Prior to Visit  Medication Sig Dispense Refill  . atorvastatin (LIPITOR) 20 MG tablet TAKE 1 TABLET (20 MG TOTAL) BY MOUTH DAILY. 90 tablet 3  . CALCIUM PO Take by mouth.    . cyclobenzaprine (FLEXERIL) 10 MG tablet Take 1/2 - 1 tablet (5-10 mg total) by mouth 3 (three) times daily as needed for muscle spasms. Caution: can cause drowsiness 60 tablet 1  . lisinopril-hydrochlorothiazide (ZESTORETIC) 20-25 MG tablet TAKE 1/2 TABLET BY MOUTH DAILY. (Patient not taking: No sig reported) 90 tablet 1  . meloxicam (MOBIC) 15 MG tablet TAKE 1 TABLET (15 MG  TOTAL) BY MOUTH DAILY. 30 tablet 0  . Multiple Vitamin (MULTIVITAMIN) tablet Take 1 tablet by mouth daily. (Patient not taking: No sig reported)    . predniSONE (DELTASONE) 20 MG tablet Take 1 tablet (20 mg total) by mouth 2 (two) times daily with a meal. 10 tablet 0  . Prenatal Vit-Fe Fumarate-FA (M-VIT PO) Take by mouth. (Patient not taking: No sig reported)    . Vilazodone HCl (VIIBRYD) 20 MG TABS Take 1 tablet (20 mg total) by mouth daily. (Patient not taking: No sig reported) 90 tablet 1  . VITAMIN D PO Take by mouth. (Patient not taking: No sig reported)     No facility-administered medications prior to visit.    No Known Allergies  Review of Systems All review of systems negative except what is listed in the HPI     Objective:    Physical Exam Vitals reviewed.  Constitutional:      Appearance: She is well-developed.  HENT:     Head: Normocephalic and atraumatic.  Cardiovascular:     Rate and Rhythm: Normal rate and regular rhythm.     Pulses: Normal pulses.     Heart sounds: Normal heart sounds.  Pulmonary:     Effort: Pulmonary effort is normal.     Breath sounds: Normal breath sounds. No wheezing or rales.  Abdominal:     Palpations: Abdomen is soft.  Musculoskeletal:        General: No tenderness. Normal range of motion.     Cervical back: Normal range of motion.  Skin:    General: Skin is warm and dry.  Neurological:     General: No focal deficit present.     Mental Status: She is alert and oriented to person, place, and time. Mental status is at baseline.  Psychiatric:        Mood and Affect: Mood normal.        Behavior: Behavior normal.        Thought Content: Thought content normal.        Judgment: Judgment normal.     BP 137/85   Pulse 81   Temp 97.9 F (36.6 C)   SpO2 96%  Wt Readings from Last 3 Encounters:  07/02/20 194 lb 0.6 oz (88 kg)  06/06/20 190 lb 1.9 oz (86.2 kg)  09/07/19 185 lb 14.4 oz (84.3 kg)    Health Maintenance Due   Topic Date Due  . COVID-19 Vaccine (3 - Booster for Pfizer series) 11/21/2019    There are no preventive care reminders to display for this patient.   Lab Results  Component Value Date   TSH 1.18 10/14/2018   Lab Results  Component Value Date   WBC 8.5 10/14/2018  HGB 14.6 10/14/2018   HCT 43.9 10/14/2018   MCV 85.9 10/14/2018   PLT 214 10/14/2018   Lab Results  Component Value Date   NA 140 09/07/2019   K 4.6 09/07/2019   CO2 31 09/07/2019   GLUCOSE 100 (H) 09/07/2019   BUN 14 09/07/2019   CREATININE 0.89 09/07/2019   BILITOT 1.6 (H) 09/07/2019   ALKPHOS 77 09/08/2016   AST 31 09/07/2019   ALT 22 09/07/2019   PROT 6.9 09/07/2019   ALBUMIN 3.8 09/08/2016   CALCIUM 10.3 09/07/2019   ANIONGAP 10 01/22/2016   Lab Results  Component Value Date   CHOL 157 08/22/2019   Lab Results  Component Value Date   HDL 57 08/22/2019   Lab Results  Component Value Date   LDLCALC 77 08/22/2019   Lab Results  Component Value Date   TRIG 126 08/22/2019   Lab Results  Component Value Date   CHOLHDL 2.8 08/22/2019   Lab Results  Component Value Date   HGBA1C 5.7 (H) 03/12/2016       Assessment & Plan:   1. Atypical chest pain 2. Fatigue, unspecified type 3. Cough EKG today was NSR 68. Will get chest xray and labs to rule out differentials. If everything negative, long-COVID likely. If symptoms continue to persist we can consider more extensive workup. Education provided. Patient aware of signs/symptoms requiring urgent evaluation.  - CBC with Differential - TSH - EKG 12-Lead - DG Chest 2 View; Future    Follow-up pending test results or sooner if symptoms worsen or fail to improve.    Terrilyn Saver, NP

## 2020-08-14 LAB — CBC WITH DIFFERENTIAL/PLATELET
Absolute Monocytes: 843 cells/uL (ref 200–950)
Basophils Absolute: 39 cells/uL (ref 0–200)
Basophils Relative: 0.4 %
Eosinophils Absolute: 186 cells/uL (ref 15–500)
Eosinophils Relative: 1.9 %
HCT: 44.8 % (ref 35.0–45.0)
Hemoglobin: 14.4 g/dL (ref 11.7–15.5)
Lymphs Abs: 3322 cells/uL (ref 850–3900)
MCH: 28 pg (ref 27.0–33.0)
MCHC: 32.1 g/dL (ref 32.0–36.0)
MCV: 87.2 fL (ref 80.0–100.0)
MPV: 10.8 fL (ref 7.5–12.5)
Monocytes Relative: 8.6 %
Neutro Abs: 5410 cells/uL (ref 1500–7800)
Neutrophils Relative %: 55.2 %
Platelets: 268 10*3/uL (ref 140–400)
RBC: 5.14 10*6/uL — ABNORMAL HIGH (ref 3.80–5.10)
RDW: 12.8 % (ref 11.0–15.0)
Total Lymphocyte: 33.9 %
WBC: 9.8 10*3/uL (ref 3.8–10.8)

## 2020-08-14 LAB — TSH: TSH: 1.02 mIU/L (ref 0.40–4.50)

## 2020-08-14 NOTE — Progress Notes (Signed)
MyChart message sent: Lab work was normal. Still waiting on chest x-ray results.

## 2020-08-15 NOTE — Progress Notes (Signed)
MyChart sent - No acute findings on your chest x-ray.

## 2020-08-16 ENCOUNTER — Other Ambulatory Visit: Payer: Self-pay

## 2020-09-09 ENCOUNTER — Emergency Department (INDEPENDENT_AMBULATORY_CARE_PROVIDER_SITE_OTHER): Payer: 59

## 2020-09-09 ENCOUNTER — Emergency Department
Admission: EM | Admit: 2020-09-09 | Discharge: 2020-09-09 | Disposition: A | Discharge status: Home / Self Care | Payer: 59 | Source: Home / Self Care

## 2020-09-09 ENCOUNTER — Other Ambulatory Visit: Payer: Self-pay

## 2020-09-09 ENCOUNTER — Encounter: Payer: Self-pay | Admitting: Emergency Medicine

## 2020-09-09 DIAGNOSIS — S99922A Unspecified injury of left foot, initial encounter: Secondary | ICD-10-CM

## 2020-09-09 DIAGNOSIS — R52 Pain, unspecified: Secondary | ICD-10-CM

## 2020-09-09 DIAGNOSIS — M79675 Pain in left toe(s): Secondary | ICD-10-CM | POA: Diagnosis not present

## 2020-09-09 DIAGNOSIS — S92512A Displaced fracture of proximal phalanx of left lesser toe(s), initial encounter for closed fracture: Secondary | ICD-10-CM

## 2020-09-09 DIAGNOSIS — M79672 Pain in left foot: Secondary | ICD-10-CM | POA: Diagnosis not present

## 2020-09-09 NOTE — Discharge Instructions (Addendum)
Xray today shows minimally displaced fracture of the 5th proximal phalanx of the left foot  Post op shoe applied in office  Rest, ice, elevate the foot  May take 800 mg ibuprofen with 1000 mg of Tylenol.  Do not exceed 4000 mg of Tylenol in 24 hours.  Follow up with orthopedics as needed

## 2020-09-09 NOTE — ED Provider Notes (Signed)
[place]   [id number] 09/09/20 Arrival Time: 1314  NW:GNFAO PAIN  SUBJECTIVE: History from: patient. Nicole Osborne is a 60 y.o. female complains of left foot pain that began last night. Reports that she has a carpet rolled up in her house, and that she ran into this. Also reports hitting the foot on stairs and heard a crack, bruising to the lateral aspect of the left foot. Localizes the pain to the lateral left foot, 4th and 5th toes. Describes the pain as constant and achy in character. Has tried OTC medications without relief. Symptoms are made worse with activity. Denies similar symptoms in the past. Denies fever, chills, erythema, weakness, numbness and tingling, saddle paresthesias, loss of bowel or bladder function.      ROS: As per HPI.  All other pertinent ROS negative.     Past Medical History:  Diagnosis Date   Anemia    Colon polyp    Depression    Diverticulosis    Gallbladder sludge    Hypertension    Hypertriglyceridemia    Past Surgical History:  Procedure Laterality Date   BREAST BIOPSY Right    BREAST EXCISIONAL BIOPSY Right 2012   CESAREAN SECTION     x 3   DILATION AND CURETTAGE OF UTERUS     TUBAL LIGATION     No Known Allergies No current facility-administered medications on file prior to encounter.   Current Outpatient Medications on File Prior to Encounter  Medication Sig Dispense Refill   atorvastatin (LIPITOR) 20 MG tablet TAKE 1 TABLET (20 MG TOTAL) BY MOUTH DAILY. 90 tablet 3   CALCIUM PO Take by mouth.     lisinopril-hydrochlorothiazide (ZESTORETIC) 20-25 MG tablet TAKE 1/2 TABLET BY MOUTH DAILY. (Patient not taking: No sig reported) 90 tablet 1   Multiple Vitamin (MULTIVITAMIN) tablet Take 1 tablet by mouth daily. (Patient not taking: No sig reported)     Vilazodone HCl (VIIBRYD) 20 MG TABS Take 1 tablet (20 mg total) by mouth daily. (Patient not taking: No sig reported) 90 tablet 1   VITAMIN D PO Take by mouth. (Patient not  taking: No sig reported)     Social History   Socioeconomic History   Marital status: Married    Spouse name: Not on file   Number of children: 3   Years of education: Not on file   Highest education level: Not on file  Occupational History   Occupation: RN  Tobacco Use   Smoking status: Never   Smokeless tobacco: Never  Vaping Use   Vaping Use: Never used  Substance and Sexual Activity   Alcohol use: Yes    Comment: occasional   Drug use: No   Sexual activity: Not on file  Other Topics Concern   Not on file  Social History Narrative   Not on file   Social Determinants of Health   Financial Resource Strain: Not on file  Food Insecurity: Not on file  Transportation Needs: Not on file  Physical Activity: Not on file  Stress: Not on file  Social Connections: Not on file  Intimate Partner Violence: Not on file   Family History  Problem Relation Age of Onset   Hypertension Mother    Diabetes Mother    Lung cancer Father    Breast cancer Sister 9   Leukemia Sister    Colon cancer Maternal Grandmother    Lung cancer Sister    CAD Neg Hx     OBJECTIVE:  Vitals:  09/09/20 1339 09/09/20 1340  BP: 105/71   Pulse: 65   Resp: 18   Temp: 98.6 F (37 C)   TempSrc: Oral   SpO2: 97%   Weight:  195 lb (88.5 kg)  Height:  5\' 4"  (1.626 m)    General appearance: ALERT; in no acute distress.  Head: NCAT Lungs: Normal respiratory effort CV: pulses 2+ bilaterally. Cap refill < 2 seconds Musculoskeletal:  Inspection: Skin warm, dry, clear and intact No erythema noted Bruising, swelling noted to dorsum of lateral left foot Palpation: Lateral left foot tender to palpation ROM: Limited ROM active and passive to left foot Skin: warm and dry Neurologic: Ambulates without difficulty; Sensation intact about the upper/ lower extremities Psychological: alert and cooperative; normal mood and affect  DIAGNOSTIC STUDIES:  DG Foot Complete Left  Result Date:  09/09/2020 CLINICAL DATA:  Toe injury EXAM: LEFT FOOT - COMPLETE 3+ VIEW COMPARISON:  None. FINDINGS: Acute fracture through the proximal diaphysis of the fifth proximal phalanx. Trace medial displacement. Joint spaces are preserved. No dislocation. IMPRESSION: Acute fracture of the fifth proximal phalanx with minimal displacement. Electronically Signed   By: Macy Mis M.D.   On: 09/09/2020 14:16     ASSESSMENT & PLAN:  1. Closed displaced fracture of proximal phalanx of lesser toe of left foot, initial encounter   2. Left foot pain   3. Foot injury, left, initial encounter   4. Pain     Xray today shows minimally displaced fracture of proximal phalanx of left foot Post op shoe applied in office today Continue conservative management of rest, ice, and gentle stretches Take ibuprofen as needed for pain relief (may cause abdominal discomfort, ulcers, and GI bleeds avoid taking with other NSAIDs) Follow up with orthopedics Return or go to the ER if you have any new or worsening symptoms (fever, chills, chest pain, abdominal pain, changes in bowel or bladder habits, pain radiating into lower legs)  Reviewed expectations re: course of current medical issues. Questions answered. Outlined signs and symptoms indicating need for more acute intervention. Patient verbalized understanding. After Visit Summary given.      Faustino Congress, NP 09/09/20 1432

## 2020-09-09 NOTE — ED Triage Notes (Signed)
LT toe injury heard a crack on the stairs. 4th and 5th

## 2020-09-20 ENCOUNTER — Other Ambulatory Visit: Payer: Self-pay

## 2020-09-20 ENCOUNTER — Ambulatory Visit (HOSPITAL_BASED_OUTPATIENT_CLINIC_OR_DEPARTMENT_OTHER): Payer: 59 | Attending: Osteopathic Medicine | Admitting: Internal Medicine

## 2020-09-20 DIAGNOSIS — R0683 Snoring: Secondary | ICD-10-CM | POA: Diagnosis not present

## 2020-09-20 DIAGNOSIS — R0902 Hypoxemia: Secondary | ICD-10-CM | POA: Diagnosis not present

## 2020-09-20 DIAGNOSIS — G4733 Obstructive sleep apnea (adult) (pediatric): Secondary | ICD-10-CM | POA: Diagnosis not present

## 2020-10-05 DIAGNOSIS — R0683 Snoring: Secondary | ICD-10-CM | POA: Diagnosis not present

## 2020-10-05 NOTE — Procedures (Signed)
    Patient Name: Nicole Osborne, Nicole Osborne Date: 09/22/2020 Gender: Female D.O.B: February 12, 1961 Age (years): 59 Referring Provider: Emeterio Reeve Height (inches): 54 Interpreting Physician: Baird Lyons MD, ABSM Weight (lbs): 195 RPSGT: Jacolyn Reedy BMI: 33 MRN: [id number] Neck Size: 16.00  CLINICAL INFORMATION Sleep Study Type: HST Indication for sleep study: OSA Epworth Sleepiness Score: 8  SLEEP STUDY TECHNIQUE A multi-channel overnight portable sleep study was performed. The channels recorded were: nasal airflow, thoracic respiratory movement, and oxygen saturation with a pulse oximetry. Snoring was also monitored.  MEDICATIONS Patient self administered medications include: None reported.  SLEEP ARCHITECTURE Patient was studied for 526.7 minutes. The sleep efficiency was 100.0 % and the patient was supine for 87.4%. The arousal index was 0.0 per hour.  RESPIRATORY PARAMETERS The overall AHI was 80.3 per hour, with a central apnea index of 0 per hour. The oxygen nadir was 67% during sleep.  CARDIAC DATA Mean heart rate during sleep was 75.8 bpm.  IMPRESSIONS - Severe obstructive sleep apnea occurred during this study (AHI = 80.3/h). - Oxygen desaturation was noted during this study (Min O2 = 67%). Mean O2 saturation 92%. - Patient snored.  DIAGNOSIS - Obstructive Sleep Apnea (G47.33) - Nocturnal Hypoxemia (G47.36)  RECOMMENDATIONS - Suggest CPAP titration sleep study or autopap.  Other options or management consultation would be based on clinical judgment. - Be careful with alcohol, sedatives and other CNS depressants that may worsen sleep apnea and disrupt normal sleep architecture. - Sleep hygiene should be reviewed to assess factors that may improve sleep quality. - Weight management and regular exercise should be initiated or continued.  [Electronically signed] 10/05/2020 10:37 AM  Baird Lyons MD, ABSM Diplomate, American Board of Sleep  Medicine   NPI: [id number]                         [place]  ELECTRONICALLY SIGNED ON:  10/05/2020, 10:35 AM [place] PH: [phone]   FX: [phone] [place]

## 2020-10-09 MED ORDER — AMBULATORY NON FORMULARY MEDICATION: 99 refills | Status: DC

## 2020-10-09 NOTE — Progress Notes (Signed)
Rx for CPAP printed Please print and attach sleep study result Can place in my inbasket Will sign Rx and can send to home health  Thanks!

## 2020-10-09 NOTE — Addendum Note (Signed)
Addended by: Maryla Morrow on: 10/09/2020 09:41 AM   Modules accepted: Orders

## 2020-10-10 ENCOUNTER — Other Ambulatory Visit: Payer: Self-pay | Admitting: *Deleted

## 2020-10-10 MED ORDER — AMBULATORY NON FORMULARY MEDICATION: 99 refills | Status: DC

## 2020-11-04 ENCOUNTER — Telehealth: Payer: Self-pay

## 2020-11-04 NOTE — Telephone Encounter (Signed)
Dr. Loreli Slot left a vm msg requesting for provider to return a call back. The request is regarding diagnostic testing for patient that requires peer to peer review. As per Dr. Loreli Slot the referral will expired today if peer to peer is not completed.

## 2020-11-05 ENCOUNTER — Other Ambulatory Visit: Payer: Self-pay | Admitting: Nurse Practitioner

## 2020-11-05 DIAGNOSIS — I10 Essential (primary) hypertension: Secondary | ICD-10-CM

## 2020-11-05 NOTE — Telephone Encounter (Signed)
Last seen here by Lovena Le 07/2020 Pt needs follow up appt

## 2020-11-06 ENCOUNTER — Other Ambulatory Visit (HOSPITAL_BASED_OUTPATIENT_CLINIC_OR_DEPARTMENT_OTHER): Payer: Self-pay

## 2020-11-07 IMAGING — CT CT CARDIAC CORONARY ARTERY CALCIUM SCORE
3 series · 14 of 20 positions shown, 15 images · non-contrast
Comparison: Chest radiograph 03/28/2018
COMPARISON: Chest radiograph 03/28/2018

Addendum:
EXAM:
OVER-READ INTERPRETATION  CT CHEST

The following report is an over-read performed by radiologist Dr.
Jamina Sisodiya [REDACTED] on 06/16/2019. This over-read
does not include interpretation of cardiac or coronary anatomy or
pathology. The coronary calcium score interpretation by the
cardiologist is attached.
CLINICAL DATA: Risk stratification
Coronary Calcium Score
TECHNIQUE: The patient was scanned on a Siemens Force scanner. Axial
non-contrast 3 mm slices were carried out through the heart. The
data set was analyzed on a dedicated work station and scored using
the Agatson method.

[Series 2: casc 3.0 bv41 2 bestdiast 71 % · axial · 0.35mm/px · z∈[-227,-155]mm · 4 of 40 slices shown, 5 images]
[im 8/40  vessel]
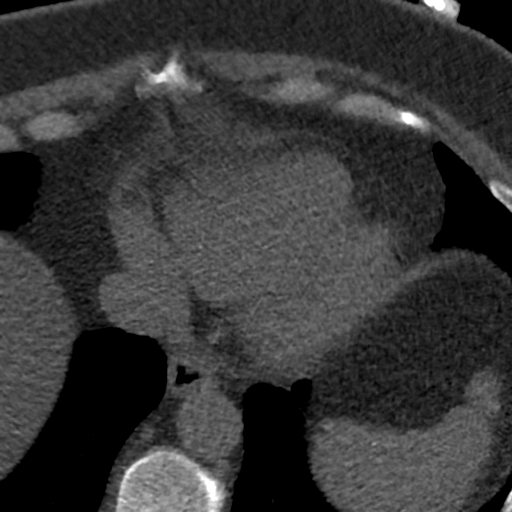
[im 8/40  lung]
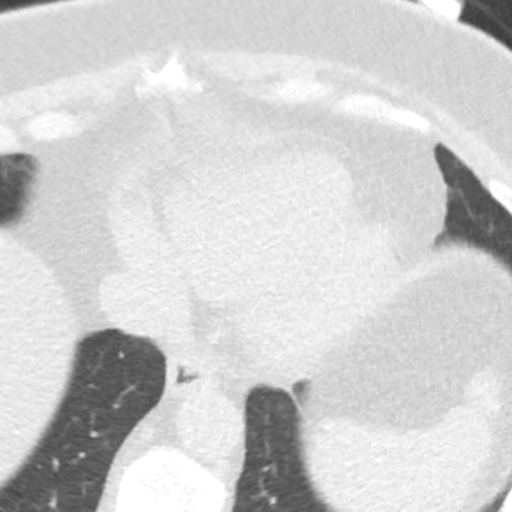
[im 16/40  vessel]
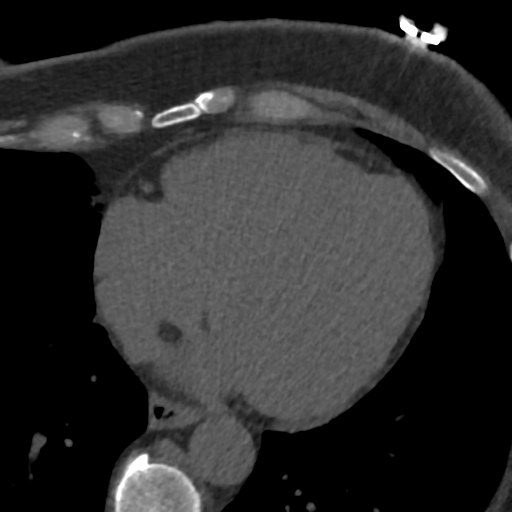
[im 24/40  vessel]
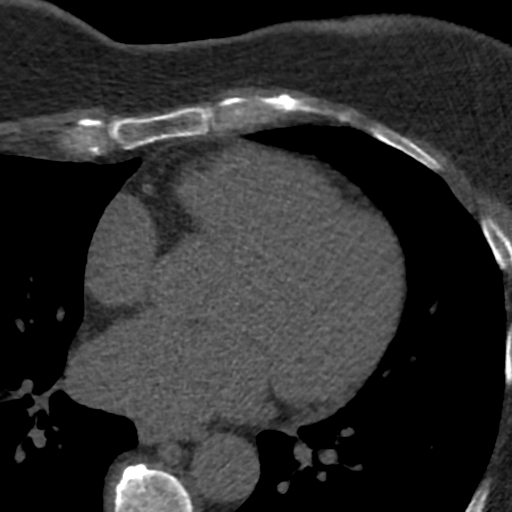
[im 32/40  vessel]
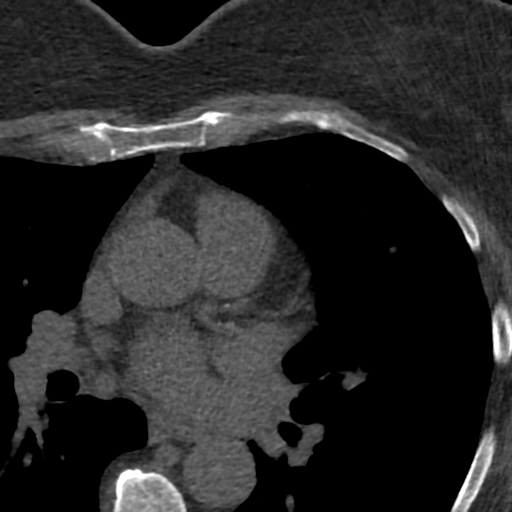

[Series 3: lung 70 % · axial · 0.66mm/px · z∈[-230,-152]mm · 5 of 40 slices shown]
[im 7/40  lung]
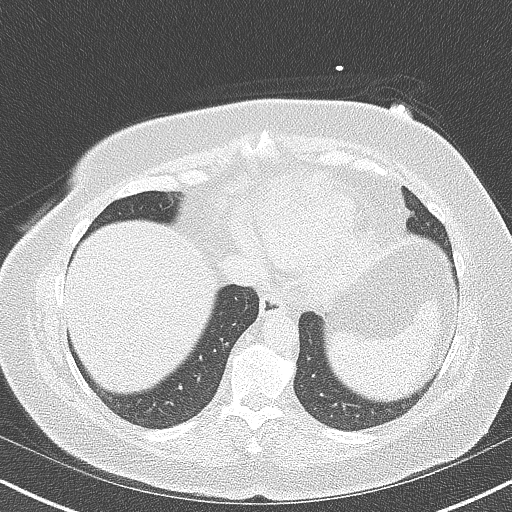
[im 14/40  lung]
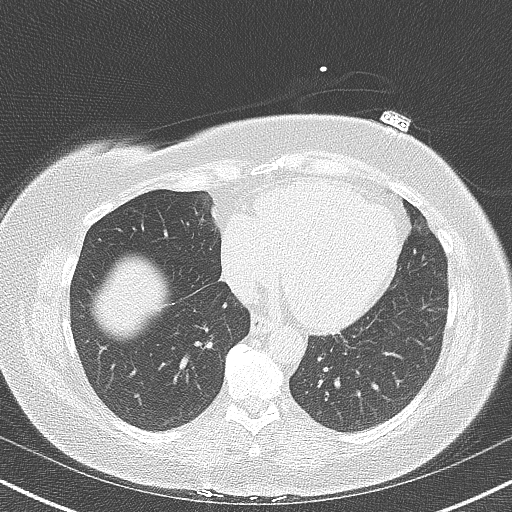
[im 20/40  lung]
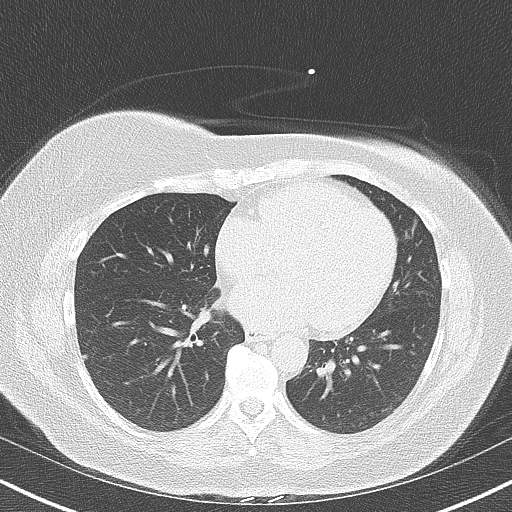
[im 27/40  lung]
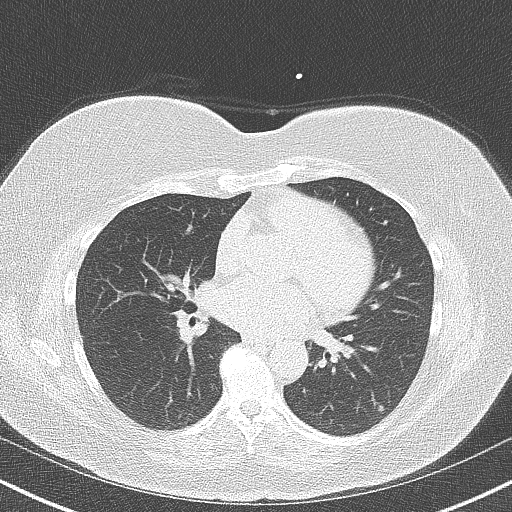
[im 33/40  lung]
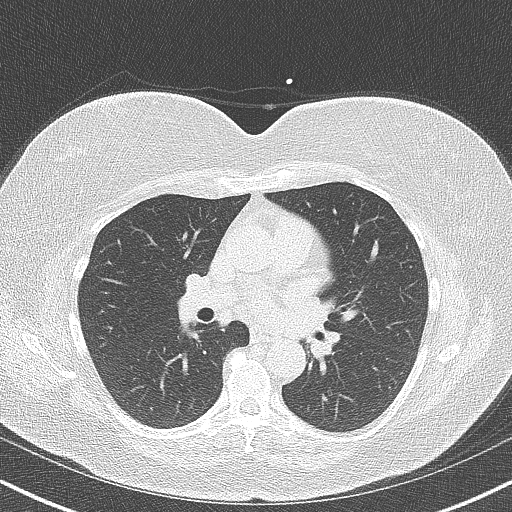

[Series 4: lung st 70 % · axial · 0.66mm/px · z∈[-230,-152]mm · 5 of 40 slices shown]
[im 7/40  lung]
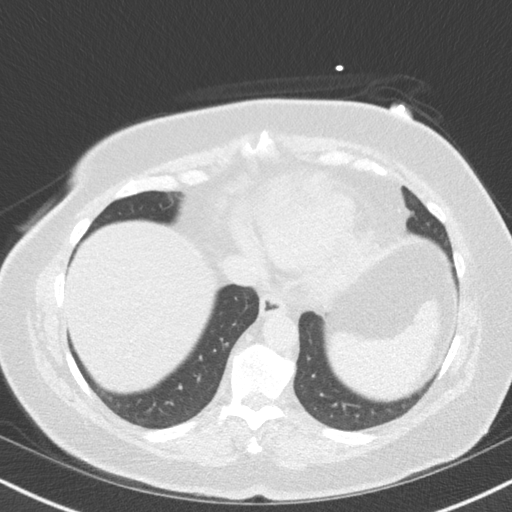
[im 14/40  lung]
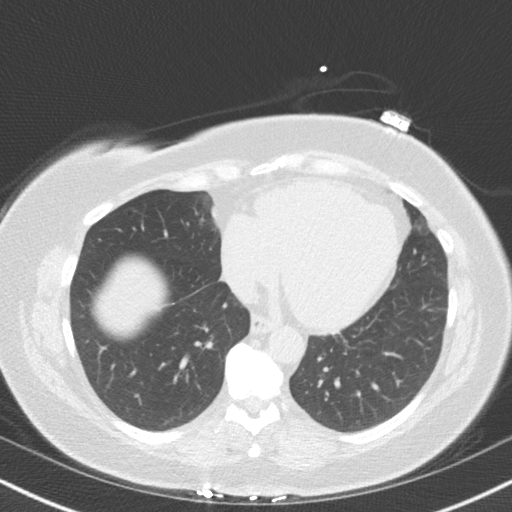
[im 20/40  lung]
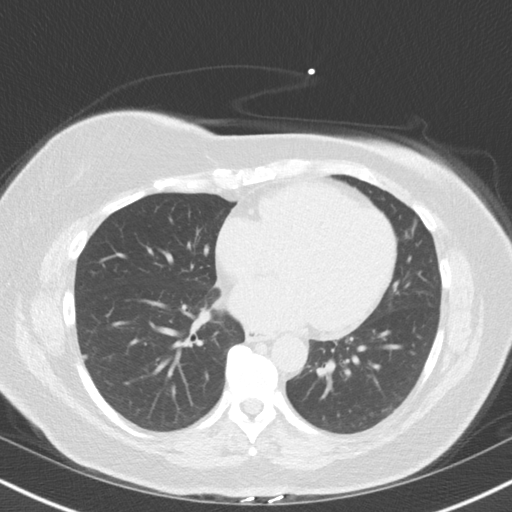
[im 27/40  lung]
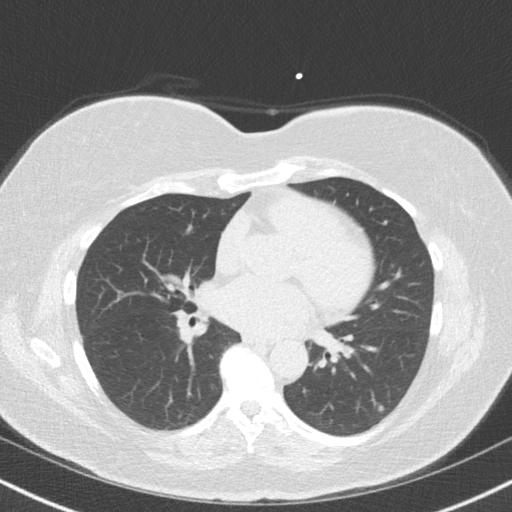
[im 33/40  lung]
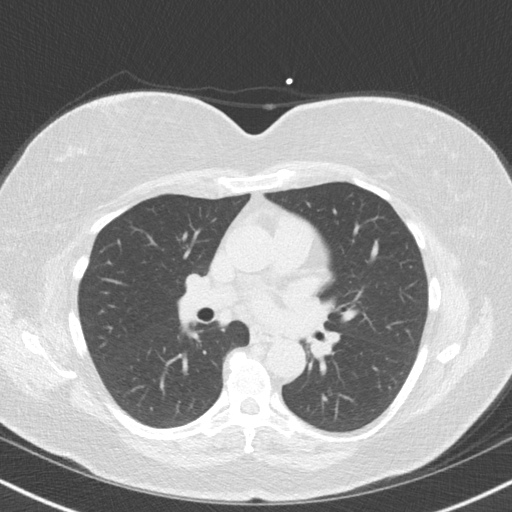

[14 of 20 positions shown; findings below may reference images not displayed]

FINDINGS: Vascular: Normal aortic caliber.

Mediastinum/Nodes: No imaged thoracic adenopathy.

Lungs/Pleura: No pleural fluid. Subpleural right lower lobe 4 mm
pulmonary nodule on [DATE]. A left lower lobe 4 mm nodule on [DATE].

Upper Abdomen: Marked hepatic steatosis. Normal imaged portions of
the spleen, stomach.

Musculoskeletal: Moderate thoracic spondylosis.
IMPRESSION: 1.  No acute findings in the imaged extracardiac chest.
2. Bilateral pulmonary nodules of 4 mm. Likely subpleural lymph
nodes. No follow-up needed if patient is low-risk. Non-contrast
chest CT can be considered in 12 months if patient is high-risk.
This recommendation follows the consensus statement: Guidelines for
Management of Incidental Pulmonary Nodules Detected on CT Images:
3. Hepatic steatosis.
FINDINGS: Non-cardiac: See separate report from [REDACTED].

Ascending Aorta: Normal size, measuring 32 mm at the mid ascending
aorta, measured double oblique at PA bifurcation. No significant
calcification.

Pericardium: Normal

Coronary arteries: Arise from normal coronary cusps.
IMPRESSION: Coronary calcium score of 2. This was 67th percentile for age and
sex matched control.

Mysael Murakami

*** End of Addendum ***
EXAM:
OVER-READ INTERPRETATION  CT CHEST

The following report is an over-read performed by radiologist Dr.
Jamina Sisodiya [REDACTED] on 06/16/2019. This over-read
does not include interpretation of cardiac or coronary anatomy or
pathology. The coronary calcium score interpretation by the
cardiologist is attached.
FINDINGS: Vascular: Normal aortic caliber.

Mediastinum/Nodes: No imaged thoracic adenopathy.

Lungs/Pleura: No pleural fluid. Subpleural right lower lobe 4 mm
pulmonary nodule on [DATE]. A left lower lobe 4 mm nodule on [DATE].

Upper Abdomen: Marked hepatic steatosis. Normal imaged portions of
the spleen, stomach.

Musculoskeletal: Moderate thoracic spondylosis.
IMPRESSION: 1.  No acute findings in the imaged extracardiac chest.
2. Bilateral pulmonary nodules of 4 mm. Likely subpleural lymph
nodes. No follow-up needed if patient is low-risk. Non-contrast
chest CT can be considered in 12 months if patient is high-risk.
This recommendation follows the consensus statement: Guidelines for
Management of Incidental Pulmonary Nodules Detected on CT Images:
3. Hepatic steatosis.

## 2020-11-11 ENCOUNTER — Other Ambulatory Visit (HOSPITAL_BASED_OUTPATIENT_CLINIC_OR_DEPARTMENT_OTHER): Payer: Self-pay

## 2020-11-11 ENCOUNTER — Other Ambulatory Visit: Payer: Self-pay | Admitting: Nurse Practitioner

## 2020-11-11 DIAGNOSIS — I10 Essential (primary) hypertension: Secondary | ICD-10-CM

## 2020-11-12 ENCOUNTER — Other Ambulatory Visit (HOSPITAL_BASED_OUTPATIENT_CLINIC_OR_DEPARTMENT_OTHER): Payer: Self-pay

## 2020-11-13 ENCOUNTER — Other Ambulatory Visit (HOSPITAL_BASED_OUTPATIENT_CLINIC_OR_DEPARTMENT_OTHER): Payer: Self-pay

## 2020-11-13 ENCOUNTER — Other Ambulatory Visit: Payer: Self-pay | Admitting: Cardiovascular Disease

## 2020-11-13 MED ORDER — LISINOPRIL-HYDROCHLOROTHIAZIDE 20-25 MG PO TABS
0.5000 | ORAL_TABLET | Freq: Every day | ORAL | 0 refills | Status: DC
  Filled 2020-11-13: qty 15, 30d supply, fill #0

## 2020-11-13 MED ORDER — ATORVASTATIN CALCIUM 20 MG PO TABS
20.0000 mg | ORAL_TABLET | Freq: Every day | ORAL | 0 refills | Status: DC
  Filled 2020-11-13: qty 30, 30d supply, fill #0

## 2020-11-13 NOTE — Telephone Encounter (Signed)
Rx(s) sent to pharmacy electronically.  

## 2020-11-29 ENCOUNTER — Other Ambulatory Visit (HOSPITAL_BASED_OUTPATIENT_CLINIC_OR_DEPARTMENT_OTHER): Payer: Self-pay

## 2020-11-29 ENCOUNTER — Ambulatory Visit (INDEPENDENT_AMBULATORY_CARE_PROVIDER_SITE_OTHER): Payer: 59 | Admitting: Osteopathic Medicine

## 2020-11-29 ENCOUNTER — Other Ambulatory Visit: Payer: Self-pay

## 2020-11-29 ENCOUNTER — Encounter: Payer: Self-pay | Admitting: Osteopathic Medicine

## 2020-11-29 VITALS — BP 124/80 | HR 73 | Temp 98.2°F | Wt 197.0 lb

## 2020-11-29 DIAGNOSIS — I10 Essential (primary) hypertension: Secondary | ICD-10-CM

## 2020-11-29 DIAGNOSIS — G4733 Obstructive sleep apnea (adult) (pediatric): Secondary | ICD-10-CM | POA: Diagnosis not present

## 2020-11-29 MED ORDER — ATORVASTATIN CALCIUM 20 MG PO TABS
20.0000 mg | ORAL_TABLET | Freq: Every day | ORAL | 3 refills | Status: DC
  Filled 2020-11-29 – 2021-01-06 (×2): qty 90, 90d supply, fill #0

## 2020-11-29 MED ORDER — LISINOPRIL-HYDROCHLOROTHIAZIDE 20-25 MG PO TABS
0.5000 | ORAL_TABLET | Freq: Every day | ORAL | 1 refills | Status: DC
  Filled 2020-11-29: qty 90, 180d supply, fill #0
  Filled 2021-01-06: qty 45, 90d supply, fill #0
  Filled 2021-05-21: qty 45, 90d supply, fill #1

## 2020-11-30 ENCOUNTER — Telehealth: Payer: Self-pay | Admitting: Osteopathic Medicine

## 2020-11-30 DIAGNOSIS — G4733 Obstructive sleep apnea (adult) (pediatric): Secondary | ICD-10-CM | POA: Insufficient documentation

## 2020-11-30 LAB — LIPID PANEL
Cholesterol: 188 mg/dL (ref <200)
HDL: 58 mg/dL (ref >=50)
LDL Cholesterol (Calc): 104 mg/dL (calc) — ABNORMAL HIGH
Non-HDL Cholesterol (Calc): 130 mg/dL (calc) — ABNORMAL HIGH (ref <130)
Total CHOL/HDL Ratio: 3.2 (calc) (ref <5.0)
Triglycerides: 148 mg/dL (ref <150)

## 2020-11-30 LAB — COMPLETE METABOLIC PANEL WITH GFR
AG Ratio: 1.7 (calc) (ref 1.0–2.5)
ALT: 35 U/L — ABNORMAL HIGH (ref 6–29)
AST: 39 U/L — ABNORMAL HIGH (ref 10–35)
Albumin: 4.3 g/dL (ref 3.6–5.1)
Alkaline phosphatase (APISO): 88 U/L (ref 37–153)
BUN: 12 mg/dL (ref 7–25)
CO2: 31 mmol/L (ref 20–32)
Calcium: 10.4 mg/dL (ref 8.6–10.4)
Chloride: 103 mmol/L (ref 98–110)
Creat: 0.73 mg/dL (ref 0.50–1.05)
Globulin: 2.5 g/dL (calc) (ref 1.9–3.7)
Glucose, Bld: 107 mg/dL — ABNORMAL HIGH (ref 65–99)
Potassium: 4.5 mmol/L (ref 3.5–5.3)
Sodium: 140 mmol/L (ref 135–146)
Total Bilirubin: 1.2 mg/dL (ref 0.2–1.2)
Total Protein: 6.8 g/dL (ref 6.1–8.1)
eGFR: 94 mL/min/{1.73_m2} (ref >=60)

## 2020-11-30 LAB — CBC
HCT: 45.3 % — ABNORMAL HIGH (ref 35.0–45.0)
Hemoglobin: 15.2 g/dL (ref 11.7–15.5)
MCH: 28.8 pg (ref 27.0–33.0)
MCHC: 33.6 g/dL (ref 32.0–36.0)
MCV: 85.8 fL (ref 80.0–100.0)
MPV: 11.1 fL (ref 7.5–12.5)
Platelets: 249 10*3/uL (ref 140–400)
RBC: 5.28 10*6/uL — ABNORMAL HIGH (ref 3.80–5.10)
RDW: 12.3 % (ref 11.0–15.0)
WBC: 7.9 10*3/uL (ref 3.8–10.8)

## 2020-11-30 NOTE — Telephone Encounter (Signed)
Need to re-send CPAP orders, can we print report and Rx? 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

## 2020-11-30 NOTE — Progress Notes (Signed)
Nicole Osborne is a 60 y.o. female who presents to  [place] at [facility]  today, 11/29/20, seeking care for the following:  Review sleep study results, we sent CPAP orders 10/10/20 but pt has not heard back      ASSESSMENT & PLAN with other pertinent findings:  The primary encounter diagnosis was Severe obstructive sleep apnea. A diagnosis of Essential hypertension was also pertinent to this visit.   Will re-send CPAP orders, pt advised let us know if still hasn't heard anything in the next 2 weeks   There are no Patient Instructions on file for this visit.  Orders Placed This Encounter  Procedures   CBC   Lipid panel   COMPLETE METABOLIC PANEL WITH GFR    Meds ordered this encounter  Medications   atorvastatin (LIPITOR) 20 MG tablet    Sig: Take 1 tablet (20 mg total) by mouth daily.    Dispense:  90 tablet    Refill:  3   lisinopril-hydrochlorothiazide (ZESTORETIC) 20-25 MG tablet    Sig: Take 1/2 tablet by mouth daily.    Dispense:  90 tablet    Refill:  1     See below for relevant physical exam findings  See below for recent lab and imaging results reviewed  Medications, allergies, PMH, PSH, SocH, [place] reviewed below    Follow-up instructions: Return in about 6 months (around 05/29/2021) for [place] / ESTABLISH W/ JOY JESSUP NP .                                        Exam:  BP 124/80 (BP Location: Left Arm, Patient Position: Sitting, Cuff Size: Normal)   Pulse 73   Temp 98.2 F (36.8 C) (Oral)   Wt 197 lb 0.6 oz (89.4 kg)   BMI 33.82 kg/m  Constitutional: VS see above. General Appearance: alert, well-developed, well-nourished, NAD Neck: No masses, trachea midline.  Respiratory: Normal respiratory effort. no wheeze, no rhonchi, no rales Cardiovascular: S1/S2 normal, no murmur, no rub/gallop auscultated. RRR.  Musculoskeletal: Gait normal. Symmetric and  independent movement of all extremities Neurological: Normal balance/coordination. No tremor. Skin: warm, dry, intact.  Psychiatric: Normal judgment/insight. Normal mood and affect. Oriented x3.   No outpatient medications have been marked as taking for the 11/29/20 encounter (Office Visit) with Emeterio Reeve, DO.    No Known Allergies  Patient Active Problem List   Diagnosis Date Noted   Severe obstructive sleep apnea 11/30/2020   Shortness of breath 09/20/7626   Colicky RUQ abdominal pain 03/28/2018   Fatty liver disease, nonalcoholic 31/51/7616   Gallbladder sludge 03/28/2018   Atypical chest pain 01/22/2016   Hyperglycemia 07/23/2014   Elevated LFTs 04/25/2014   History of colonoscopy 03/28/2014   Hypertension 03/02/2014   Hyperlipidemia 03/02/2014   Depression, major, single episode, in partial remission ([place]) 03/02/2014    Family History  Problem Relation Age of Onset   Hypertension Mother    Diabetes Mother    Lung cancer Father    Breast cancer Sister 34   Leukemia Sister    Colon cancer Maternal Grandmother    Lung cancer Sister    CAD Neg Hx     Social History   Tobacco Use  Smoking Status Never  Smokeless Tobacco Never    Past Surgical History:  Procedure Laterality Date   BREAST BIOPSY Right  BREAST EXCISIONAL BIOPSY Right 2012   CESAREAN SECTION     x 3   DILATION AND CURETTAGE OF UTERUS     TUBAL LIGATION      Immunization History  Administered Date(s) Administered   Influenza-Unspecified 12/29/2014, 12/29/2015, 12/28/2016, 01/28/2019   PFIZER(Purple Top)SARS-COV-2 Vaccination 05/24/2019, 06/21/2019   Zoster Recombinat (Shingrix) 10/12/2018, 04/12/2019    Recent Results (from the past 2160 hour(s))  CBC     Status: Abnormal   Collection Time: 11/29/20 12:00 AM  Result Value Ref Range   WBC 7.9 3.8 - 10.8 Thousand/uL   RBC 5.28 (H) 3.80 - 5.10 Million/uL   Hemoglobin 15.2 11.7 - 15.5 g/dL   HCT 45.3 (H) 35.0 - 45.0 %   MCV 85.8  80.0 - 100.0 fL   MCH 28.8 27.0 - 33.0 pg   MCHC 33.6 32.0 - 36.0 g/dL   RDW 12.3 11.0 - 15.0 %   Platelets 249 140 - 400 Thousand/uL   MPV 11.1 7.5 - 12.5 fL  Lipid panel     Status: Abnormal   Collection Time: 11/29/20 12:00 AM  Result Value Ref Range   Cholesterol 188 <200 mg/dL   HDL 58 > OR = 50 mg/dL   Triglycerides 148 <150 mg/dL   LDL Cholesterol (Calc) 104 (H) mg/dL (calc)    Comment: Reference range: <100 . Desirable range <100 mg/dL for primary prevention;   <70 mg/dL for patients with CHD or diabetic patients  with > or = 2 CHD risk factors. Marland Kitchen LDL-C is now calculated using the Martin-Hopkins  calculation, which is a validated novel method providing  better accuracy than the Friedewald equation in the  estimation of LDL-C.  Cresenciano Genre et al. Annamaria Helling. 8127;517(00): 2061-2068  (http://education.[employer].com/faq/FAQ164)    Total CHOL/HDL Ratio 3.2 <5.0 (calc)   Non-HDL Cholesterol (Calc) 130 (H) <130 mg/dL (calc)    Comment: For patients with diabetes plus 1 major ASCVD risk  factor, treating to a non-HDL-C goal of <100 mg/dL  (LDL-C of <70 mg/dL) is considered a therapeutic  option.   COMPLETE METABOLIC PANEL WITH GFR     Status: Abnormal   Collection Time: 11/29/20 12:00 AM  Result Value Ref Range   Glucose, Bld 107 (H) 65 - 99 mg/dL    Comment: .            Fasting reference interval . For someone without known diabetes, a glucose value between 100 and 125 mg/dL is consistent with prediabetes and should be confirmed with a follow-up test. .    BUN 12 7 - 25 mg/dL   Creat 0.73 0.50 - 1.05 mg/dL   eGFR 94 > OR = 60 mL/min/1.50m    Comment: The eGFR is based on the CKD-EPI 2021 equation. To calculate  the new eGFR from a previous Creatinine or Cystatin C result, go to https://www.kidney.org/professionals/ kdoqi/gfr%5Fcalculator    BUN/Creatinine Ratio NOT APPLICABLE 6 - 22 (calc)   Sodium 140 135 - 146 mmol/L   Potassium 4.5 3.5 - 5.3 mmol/L    Chloride 103 98 - 110 mmol/L   CO2 31 20 - 32 mmol/L   Calcium 10.4 8.6 - 10.4 mg/dL   Total Protein 6.8 6.1 - 8.1 g/dL   Albumin 4.3 3.6 - 5.1 g/dL   Globulin 2.5 1.9 - 3.7 g/dL (calc)   AG Ratio 1.7 1.0 - 2.5 (calc)   Total Bilirubin 1.2 0.2 - 1.2 mg/dL   Alkaline phosphatase (APISO) 88 37 - 153 U/L   AST 39 (  H) 10 - 35 U/L   ALT 35 (H) 6 - 29 U/L    No results found.     All questions at time of visit were answered - patient instructed to contact office with any additional concerns or updates. ER/RTC precautions were reviewed with the patient as applicable.   Please note: manual typing as well as voice recognition software may have been used to produce this document - typos may escape review. Please contact Dr. Sheppard Coil for any needed clarifications.

## 2020-12-04 NOTE — Telephone Encounter (Signed)
Task completed. CPAP order, sleep study results, recent visit summary and insurance re-faxed to [facility]. Fax confirmation was rec'd.

## 2020-12-31 DIAGNOSIS — H524 Presbyopia: Secondary | ICD-10-CM | POA: Diagnosis not present

## 2020-12-31 DIAGNOSIS — H52221 Regular astigmatism, right eye: Secondary | ICD-10-CM | POA: Diagnosis not present

## 2021-01-06 ENCOUNTER — Other Ambulatory Visit (HOSPITAL_BASED_OUTPATIENT_CLINIC_OR_DEPARTMENT_OTHER): Payer: Self-pay

## 2021-04-09 ENCOUNTER — Telehealth: Payer: Self-pay

## 2021-04-24 DIAGNOSIS — G4733 Obstructive sleep apnea (adult) (pediatric): Secondary | ICD-10-CM | POA: Diagnosis not present

## 2021-05-21 ENCOUNTER — Other Ambulatory Visit (HOSPITAL_BASED_OUTPATIENT_CLINIC_OR_DEPARTMENT_OTHER): Payer: Self-pay

## 2021-05-25 DIAGNOSIS — G4733 Obstructive sleep apnea (adult) (pediatric): Secondary | ICD-10-CM | POA: Diagnosis not present

## 2021-05-26 ENCOUNTER — Ambulatory Visit: Payer: 59 | Admitting: Orthopedic Surgery

## 2021-05-29 ENCOUNTER — Ambulatory Visit (INDEPENDENT_AMBULATORY_CARE_PROVIDER_SITE_OTHER): Payer: 59

## 2021-05-29 ENCOUNTER — Ambulatory Visit: Payer: 59 | Admitting: Orthopedic Surgery

## 2021-05-29 DIAGNOSIS — M25551 Pain in right hip: Secondary | ICD-10-CM | POA: Diagnosis not present

## 2021-05-29 DIAGNOSIS — M25561 Pain in right knee: Secondary | ICD-10-CM | POA: Diagnosis not present

## 2021-05-29 DIAGNOSIS — G8929 Other chronic pain: Secondary | ICD-10-CM

## 2021-06-03 ENCOUNTER — Encounter: Payer: Self-pay | Admitting: Orthopedic Surgery

## 2021-06-03 DIAGNOSIS — G8929 Other chronic pain: Secondary | ICD-10-CM

## 2021-06-03 DIAGNOSIS — M25561 Pain in right knee: Secondary | ICD-10-CM | POA: Diagnosis not present

## 2021-06-03 DIAGNOSIS — M25551 Pain in right hip: Secondary | ICD-10-CM | POA: Diagnosis not present

## 2021-06-03 MED ORDER — LIDOCAINE HCL (PF) 1 % IJ SOLN
5.0000 mL | INTRAMUSCULAR | Status: AC | PRN
  Administered 2021-06-03: 5 mL

## 2021-06-03 MED ORDER — METHYLPREDNISOLONE ACETATE 40 MG/ML IJ SUSP
40.0000 mg | INTRAMUSCULAR | Status: AC | PRN
  Administered 2021-06-03: 40 mg via INTRA_ARTICULAR

## 2021-06-03 NOTE — Progress Notes (Signed)
? ?Office Visit Note ?  ?Patient: Nicole Osborne           ?Date of Birth: [birth year]           ?MRN: [id number] ?Visit Date: 05/29/2021 ?             ?Requested by: Emeterio Reeve, DO ?[address] ?[address] ?[place],  [place] [address] ?PCP: Emeterio Reeve, DO ? ?Chief Complaint  ?Patient presents with  ? Right Knee - Pain  ? Right Hip - Pain  ? ? ? ? ?HPI: ?Patient is a 61 year old woman who presents complaining of chronic right knee and right hip pain.  She is status post a steroid injection of the right knee in 2014.  Patient states she has pain with start up feels better after she has been moving.  Patient states she could not make a revolution on a bicycle secondary to her right knee pain. ? ?Assessment & Plan: ?Visit Diagnoses:  ?1. Chronic pain of right knee   ?2. Pain in right hip   ? ? ?Plan: The right knee was injected she tolerated this well plan to follow-up in 4 weeks. ? ?Follow-Up Instructions: Return in about 4 weeks (around 06/26/2021).  ? ?Ortho Exam ? ?Patient is alert, oriented, no adenopathy, well-dressed, normal affect, normal respiratory effort. ?Examination patient has no pain with range of motion of the right hip she has a negative straight leg raise no sciatic tension signs.  She does have some tenderness over the lateral bursa right hip.  Examination the right knee collaterals and cruciates are stable she has crepitation with range of motion and tenderness to palpation worse on the medial joint line and the patellofemoral joint. ? ?Imaging: ?No results found. ?No images are attached to the encounter. ? ?Labs: ?Lab Results  ?Component Value Date  ? HGBA1C 5.7 (H) 03/12/2016  ? ESRSEDRATE 7 02/02/2014  ? CRP <0.5 02/02/2014  ? LABORGA NO GROWTH 09/08/2016  ? ? ? ?Lab Results  ?Component Value Date  ? ALBUMIN 3.8 09/08/2016  ? ALBUMIN 4.1 01/27/2016  ? ALBUMIN 4.5 01/22/2016  ? ? ?No results found for: MG ?No results found for: VD25OH ? ?No results found for: PREALBUMIN ?CBC  EXTENDED Latest Ref Rng & Units 11/29/2020 08/13/2020 10/14/2018  ?WBC 3.8 - 10.8 Thousand/uL 7.9 9.8 8.5  ?RBC 3.80 - 5.10 Million/uL 5.28(H) 5.14(H) 5.11(H)  ?HGB 11.7 - 15.5 g/dL 15.2 14.4 14.6  ?HCT 35.0 - 45.0 % 45.3(H) 44.8 43.9  ?PLT 140 - 400 Thousand/uL 249 268 214  ?NEUTROABS 1,500 - 7,800 cells/uL - 5,410 -  ?LYMPHSABS 850 - 3,900 cells/uL - 3,322 -  ? ? ? ?There is no height or weight on file to calculate BMI. ? ?Orders:  ?Orders Placed This Encounter  ?Procedures  ? XR Knee 1-2 Views Right  ? XR HIP UNILAT W OR W/O PELVIS 2-3 VIEWS RIGHT  ? ?No orders of the defined types were placed in this encounter. ? ? ? Procedures: ?Large Joint Inj: R knee on 06/03/2021 12:24 PM ?Indications: pain and diagnostic evaluation ?Details: 22 G 1.5 in needle, anteromedial approach ? ?Arthrogram: No ? ?Medications: 5 mL lidocaine (PF) 1 %; 40 mg methylPREDNISolone acetate 40 MG/ML ?Outcome: tolerated well, no immediate complications ?Procedure, treatment alternatives, risks and benefits explained, specific risks discussed. Consent was given by the patient. Immediately prior to procedure a time out was called to verify the correct patient, procedure, equipment, support staff and site/side marked as required. Patient was  prepped and draped in the usual sterile fashion.  ? ? ? ?Clinical Data: ?No additional findings. ? ?ROS: ? ?All other systems negative, except as noted in the HPI. ?Review of Systems ? ?Objective: ?Vital Signs: There were no vitals taken for this visit. ? ?Specialty Comments:  ?No specialty comments available. ? ?PMFS History: ?Patient Active Problem List  ? Diagnosis Date Noted  ? Severe obstructive sleep apnea 11/30/2020  ? Shortness of breath 05/30/2019  ? Colicky RUQ abdominal pain 03/28/2018  ? Fatty liver disease, nonalcoholic 76/72/0947  ? Gallbladder sludge 03/28/2018  ? Atypical chest pain 01/22/2016  ? Hyperglycemia 07/23/2014  ? Elevated LFTs 04/25/2014  ? History of colonoscopy 03/28/2014  ?  Hypertension 03/02/2014  ? Hyperlipidemia 03/02/2014  ? Depression, major, single episode, in partial remission ([place]) 03/02/2014  ? ?Past Medical History:  ?Diagnosis Date  ? Anemia   ? Colon polyp   ? Depression   ? Diverticulosis   ? Gallbladder sludge   ? Hypertension   ? Hypertriglyceridemia   ?  ?Family History  ?Problem Relation Age of Onset  ? Hypertension Mother   ? Diabetes Mother   ? Lung cancer Father   ? Breast cancer Sister 79  ? Leukemia Sister   ? Colon cancer Maternal Grandmother   ? Lung cancer Sister   ? CAD Neg Hx   ?  ?Past Surgical History:  ?Procedure Laterality Date  ? BREAST BIOPSY Right   ? BREAST EXCISIONAL BIOPSY Right 2012  ? CESAREAN SECTION    ? x 3  ? DILATION AND CURETTAGE OF UTERUS    ? TUBAL LIGATION    ? ?Social History  ? ?Occupational History  ? Occupation: Therapist, sports  ?Tobacco Use  ? Smoking status: Never  ? Smokeless tobacco: Never  ?Vaping Use  ? Vaping Use: Never used  ?Substance and Sexual Activity  ? Alcohol use: Yes  ?  Comment: occasional  ? Drug use: No  ? Sexual activity: Not on file  ? ? ? ? ? ?

## 2021-06-05 ENCOUNTER — Ambulatory Visit: Payer: 59 | Admitting: Orthopedic Surgery

## 2021-06-22 DIAGNOSIS — G4733 Obstructive sleep apnea (adult) (pediatric): Secondary | ICD-10-CM | POA: Diagnosis not present

## 2021-06-26 ENCOUNTER — Encounter: Payer: Self-pay | Admitting: Orthopedic Surgery

## 2021-06-26 ENCOUNTER — Ambulatory Visit: Payer: 59 | Admitting: Orthopedic Surgery

## 2021-06-26 DIAGNOSIS — G8929 Other chronic pain: Secondary | ICD-10-CM

## 2021-06-26 DIAGNOSIS — M25561 Pain in right knee: Secondary | ICD-10-CM

## 2021-06-26 NOTE — Progress Notes (Signed)
? ?Office Visit Note ?  ?Patient: Nicole Osborne           ?Date of Birth: Mar 13, 1961           ?MRN: [id number] ?Visit Date: 06/26/2021 ?             ?Requested by: Emeterio Reeve, DO ?[address] ?[address] ?[place],  [place] [address] ?PCP: Emeterio Reeve, DO ? ?Chief Complaint  ?Patient presents with  ? Right Knee - Pain  ?  S/p injection 06/03/21  ? ? ? ? ?HPI: ?Patient is a 61 year old woman who presents in follow-up status post intra-articular injection right knee.  She states that you cured me she has no knee pain. ? ?Assessment & Plan: ?Visit Diagnoses:  ?1. Chronic pain of right knee   ? ? ?Plan: Follow-up as needed she will start working on strengthening and aerobic conditioning. ? ?Follow-Up Instructions: Return if symptoms worsen or fail to improve.  ? ?Ortho Exam ? ?Patient is alert, oriented, no adenopathy, well-dressed, normal affect, normal respiratory effort. ?No examination patient has no symptoms. ? ?Imaging: ?No results found. ?No images are attached to the encounter. ? ?Labs: ?Lab Results  ?Component Value Date  ? HGBA1C 5.7 (H) 03/12/2016  ? ESRSEDRATE 7 02/02/2014  ? CRP <0.5 02/02/2014  ? LABORGA NO GROWTH 09/08/2016  ? ? ? ?Lab Results  ?Component Value Date  ? ALBUMIN 3.8 09/08/2016  ? ALBUMIN 4.1 01/27/2016  ? ALBUMIN 4.5 01/22/2016  ? ? ?No results found for: MG ?No results found for: VD25OH ? ?No results found for: PREALBUMIN ? ?  Latest Ref Rng & Units 11/29/2020  ? 12:00 AM 08/13/2020  ? 12:00 AM 10/14/2018  ? 10:56 AM  ?CBC EXTENDED  ?WBC 3.8 - 10.8 Thousand/uL 7.9   9.8   8.5    ?RBC 3.80 - 5.10 Million/uL 5.28   5.14   5.11    ?Hemoglobin 11.7 - 15.5 g/dL 15.2   14.4   14.6    ?HCT 35.0 - 45.0 % 45.3   44.8   43.9    ?Platelets 140 - 400 Thousand/uL 249   268   214    ?NEUT# 1,500 - 7,800 cells/uL  5,410     ?Lymph# 850 - 3,900 cells/uL  3,322     ? ? ? ?There is no height or weight on file to calculate BMI. ? ?Orders:  ?No orders of the defined types were placed in this  encounter. ? ?No orders of the defined types were placed in this encounter. ? ? ? Procedures: ?No procedures performed ? ?Clinical Data: ?No additional findings. ? ?ROS: ? ?All other systems negative, except as noted in the HPI. ?Review of Systems ? ?Objective: ?Vital Signs: There were no vitals taken for this visit. ? ?Specialty Comments:  ?No specialty comments available. ? ?PMFS History: ?Patient Active Problem List  ? Diagnosis Date Noted  ? Severe obstructive sleep apnea 11/30/2020  ? Shortness of breath 05/30/2019  ? Colicky RUQ abdominal pain 03/28/2018  ? Fatty liver disease, nonalcoholic 74/25/9563  ? Gallbladder sludge 03/28/2018  ? Atypical chest pain 01/22/2016  ? Hyperglycemia 07/23/2014  ? Elevated LFTs 04/25/2014  ? History of colonoscopy 03/28/2014  ? Hypertension 03/02/2014  ? Hyperlipidemia 03/02/2014  ? Depression, major, single episode, in partial remission ([place]) 03/02/2014  ? ?Past Medical History:  ?Diagnosis Date  ? Anemia   ? Colon polyp   ? Depression   ? Diverticulosis   ? Gallbladder sludge   ?  Hypertension   ? Hypertriglyceridemia   ?  ?Family History  ?Problem Relation Age of Onset  ? Hypertension Mother   ? Diabetes Mother   ? Lung cancer Father   ? Breast cancer Sister 21  ? Leukemia Sister   ? Colon cancer Maternal Grandmother   ? Lung cancer Sister   ? CAD Neg Hx   ?  ?Past Surgical History:  ?Procedure Laterality Date  ? BREAST BIOPSY Right   ? BREAST EXCISIONAL BIOPSY Right 2012  ? CESAREAN SECTION    ? x 3  ? DILATION AND CURETTAGE OF UTERUS    ? TUBAL LIGATION    ? ?Social History  ? ?Occupational History  ? Occupation: Therapist, sports  ?Tobacco Use  ? Smoking status: Never  ? Smokeless tobacco: Never  ?Vaping Use  ? Vaping Use: Never used  ?Substance and Sexual Activity  ? Alcohol use: Yes  ?  Comment: occasional  ? Drug use: No  ? Sexual activity: Not on file  ? ? ? ? ? ?

## 2021-08-13 ENCOUNTER — Other Ambulatory Visit: Payer: Self-pay | Admitting: Family Medicine

## 2021-08-13 DIAGNOSIS — Z1231 Encounter for screening mammogram for malignant neoplasm of breast: Secondary | ICD-10-CM

## 2021-08-14 ENCOUNTER — Encounter: Payer: Self-pay | Admitting: Family Medicine

## 2021-08-14 ENCOUNTER — Other Ambulatory Visit (HOSPITAL_BASED_OUTPATIENT_CLINIC_OR_DEPARTMENT_OTHER): Payer: Self-pay

## 2021-08-14 ENCOUNTER — Ambulatory Visit: Payer: 59 | Admitting: Family Medicine

## 2021-08-14 VITALS — BP 121/78 | HR 88 | Ht 64.0 in | Wt 186.0 lb

## 2021-08-14 DIAGNOSIS — Z23 Encounter for immunization: Secondary | ICD-10-CM | POA: Diagnosis not present

## 2021-08-14 DIAGNOSIS — E782 Mixed hyperlipidemia: Secondary | ICD-10-CM

## 2021-08-14 DIAGNOSIS — R21 Rash and other nonspecific skin eruption: Secondary | ICD-10-CM | POA: Diagnosis not present

## 2021-08-14 DIAGNOSIS — I10 Essential (primary) hypertension: Secondary | ICD-10-CM

## 2021-08-14 MED ORDER — PREDNISONE 10 MG PO TABS
ORAL_TABLET | ORAL | 0 refills | Status: DC
  Filled 2021-08-14: qty 21, 6d supply, fill #0

## 2021-08-14 NOTE — Patient Instructions (Addendum)
Take medications consistently.  Start prednisone for rash.  Let me know if not resolving.  Continue to work on weight loss.   See me again in 6 months.

## 2021-08-14 NOTE — Assessment & Plan Note (Signed)
She is not taking Lipitor consistently.  Recommend taking this as directed.

## 2021-08-14 NOTE — Assessment & Plan Note (Signed)
Consistent with contact dermatitis.  Starting prednisone taper.

## 2021-08-14 NOTE — Assessment & Plan Note (Signed)
Blood pressure well controlled at this time.  Recommend continuation of current medication.  Discussed remaining consistent with this.  Encouraged continued weight loss through dietary changes.

## 2021-08-14 NOTE — Progress Notes (Signed)
Nicole Osborne - 61 y.o. female MRN [id number]  Date of birth: Nov 23, 1960  Subjective Chief Complaint  Patient presents with   Transitions Of Care    HPI Nicole Osborne is a 61 year old female here today for follow-up visit.  History of hypertension, hyperlipidemia and nonalcoholic fatty liver disease.  She has been inconsistent with taking her medications for management of her hypertension.  Additionally she has not been taking atorvastatin regularly for management of hyperlipidemia.  She reports she plans to do better about this.  The symptoms related to hypertension including chest pain, shortness of breath, palpitations, headaches or vision changes.  She does have a rash on her hands that popped up after using new cleaning products recently.  She does have mild rash on her face as well that she is extension touching her face after using the products.  Areas are somewhat itchy with some burning sensation.  She has not tried anything on these so far.   ROS:  A comprehensive ROS was completed and negative except as noted per HPI  No Known Allergies  Past Medical History:  Diagnosis Date   Anemia    Colon polyp    Depression    Diverticulosis    Gallbladder sludge    Hypertension    Hypertriglyceridemia     Past Surgical History:  Procedure Laterality Date   BREAST BIOPSY Right    BREAST EXCISIONAL BIOPSY Right 2012   CESAREAN SECTION     x 3   DILATION AND CURETTAGE OF UTERUS     TUBAL LIGATION      Social History   Socioeconomic History   Marital status: Married    Spouse name: Not on file   Number of children: 3   Years of education: Not on file   Highest education level: Not on file  Occupational History   Occupation: RN  Tobacco Use   Smoking status: Never   Smokeless tobacco: Never  Vaping Use   Vaping Use: Never used  Substance and Sexual Activity   Alcohol use: Yes    Comment: occasional   Drug use: No   Sexual activity: Not on file  Other Topics  Concern   Not on file  Social History Narrative   Not on file   Social Determinants of Health   Financial Resource Strain: Not on file  Food Insecurity: Not on file  Transportation Needs: Not on file  Physical Activity: Not on file  Stress: Not on file  Social Connections: Not on file    Family History  Problem Relation Age of Onset   Hypertension Mother    Diabetes Mother    Lung cancer Father    Breast cancer Sister 98   Leukemia Sister    Colon cancer Maternal Grandmother    Lung cancer Sister    CAD Neg Hx     Health Maintenance  Topic Date Due   MAMMOGRAM  05/02/2021   PAP SMEAR-Modifier  11/28/2021 (Originally 03/12/2021)   COVID-19 Vaccine (3 - Booster for [place] series) 11/28/2021 (Originally 08/16/2019)   INFLUENZA VACCINE  10/28/2021   COLONOSCOPY (Pts 45-59yr Insurance coverage will need to be confirmed)  02/25/2026   TETANUS/TDAP  08/15/2031   Hepatitis C Screening  Completed   HIV Screening  Completed   Zoster Vaccines- Shingrix  Completed   HPV VACCINES  Aged Out     ----------------------------------------------------------------------------------------------------------------------------------------------------------------------------------------------------------------- Physical Exam BP 121/78 (BP Location: Left Arm, Patient Position: Sitting, Cuff Size: Normal)   Pulse 88  Ht '5\' 4"'$  (1.626 m)   Wt 186 lb (84.4 kg)   SpO2 97%   BMI 31.93 kg/m   Physical Exam Constitutional:      Appearance: Normal appearance.  Eyes:     General: No scleral icterus. Cardiovascular:     Rate and Rhythm: Normal rate and regular rhythm.  Pulmonary:     Effort: Pulmonary effort is normal.     Breath sounds: Normal breath sounds.  Musculoskeletal:     Cervical back: Neck supple.  Skin:    Comments: Slightly raised erythematous rash on the backs of bilateral hands and fingers.  There are some mild erythema on cheeks bilaterally.  Neurological:     Mental  Status: She is alert.  Psychiatric:        Mood and Affect: Mood normal.        Behavior: Behavior normal.    ------------------------------------------------------------------------------------------------------------------------------------------------------------------------------------------------------------------- Assessment and Plan  Hypertension Blood pressure well controlled at this time.  Recommend continuation of current medication.  Discussed remaining consistent with this.  Encouraged continued weight loss through dietary changes.  Rash Consistent with contact dermatitis.  Starting prednisone taper.  Hyperlipidemia She is not taking Lipitor consistently.  Recommend taking this as directed.   Meds ordered this encounter  Medications   predniSONE (DELTASONE) 10 MG tablet    Sig: Take 6 tablets by mouth daily on day 1, then take 5 tablets on day 2, then 4 tablets on day 3, then 3 tablets on day 4, then 2 tablets on day 5, then 1 tablet on day 6.    Dispense:  21 tablet    Refill:  0    Return in about 6 months (around 02/14/2022) for annual exam/labs.    This visit occurred during the SARS-CoV-2 public health emergency.  Safety protocols were in place, including screening questions prior to the visit, additional usage of staff PPE, and extensive cleaning of exam room while observing appropriate contact time as indicated for disinfecting solutions.

## 2021-08-20 ENCOUNTER — Ambulatory Visit (INDEPENDENT_AMBULATORY_CARE_PROVIDER_SITE_OTHER): Payer: 59

## 2021-08-20 DIAGNOSIS — Z1231 Encounter for screening mammogram for malignant neoplasm of breast: Secondary | ICD-10-CM | POA: Diagnosis not present

## 2021-10-20 ENCOUNTER — Telehealth: Payer: 59 | Admitting: Physician Assistant

## 2021-10-20 ENCOUNTER — Other Ambulatory Visit (HOSPITAL_BASED_OUTPATIENT_CLINIC_OR_DEPARTMENT_OTHER): Payer: Self-pay

## 2021-10-20 DIAGNOSIS — J019 Acute sinusitis, unspecified: Secondary | ICD-10-CM

## 2021-10-20 DIAGNOSIS — B9689 Other specified bacterial agents as the cause of diseases classified elsewhere: Secondary | ICD-10-CM

## 2021-10-20 MED ORDER — AMOXICILLIN-POT CLAVULANATE 875-125 MG PO TABS
1.0000 | ORAL_TABLET | Freq: Two times a day (BID) | ORAL | 0 refills | Status: DC
  Filled 2021-10-20: qty 14, 7d supply, fill #0

## 2021-10-20 MED ORDER — IPRATROPIUM BROMIDE 0.03 % NA SOLN
2.0000 | Freq: Two times a day (BID) | NASAL | 0 refills | Status: DC
  Filled 2021-10-20: qty 30, 75d supply, fill #0

## 2021-10-20 NOTE — Patient Instructions (Signed)
Nicole Osborne, thank you for joining Mar Daring, PA-C for today's virtual visit.  While this provider is not your primary care provider (PCP), if your PCP is located in our provider database this encounter information will be shared with them immediately following your visit.  Consent: (Patient) Nicole Osborne provided verbal consent for this virtual visit at the beginning of the encounter.  Current Medications:  Current Outpatient Medications:    AMBULATORY NON FORMULARY MEDICATION, Supply ordered: CPAP and other supplies needed (headgear, cushions, filters, heated tuubing and water chamber) Dx: obstructive sleep apnea Settings: auto-titration 5-20 cmH2O, Disp: 1 Units, Rfl: 99   amoxicillin-clavulanate (AUGMENTIN) 875-125 MG tablet, Take 1 tablet by mouth 2 (two) times daily., Disp: 14 tablet, Rfl: 0   atorvastatin (LIPITOR) 20 MG tablet, Take 1 tablet (20 mg total) by mouth daily., Disp: 90 tablet, Rfl: 3   ipratropium (ATROVENT) 0.03 % nasal spray, Place 2 sprays into both nostrils every 12 (twelve) hours., Disp: 30 mL, Rfl: 0   lisinopril-hydrochlorothiazide (ZESTORETIC) 20-25 MG tablet, Take 1/2 tablet by mouth daily., Disp: 90 tablet, Rfl: 1   Multiple Vitamin (MULTIVITAMIN) tablet, Take 1 tablet by mouth daily., Disp: , Rfl:    Medications ordered in this encounter:  Meds ordered this encounter  Medications   amoxicillin-clavulanate (AUGMENTIN) 875-125 MG tablet    Sig: Take 1 tablet by mouth 2 (two) times daily.    Dispense:  14 tablet    Refill:  0    Order Specific Question:   Supervising Provider    Answer:   MILLER, BRIAN [[id number]]   ipratropium (ATROVENT) 0.03 % nasal spray    Sig: Place 2 sprays into both nostrils every 12 (twelve) hours.    Dispense:  30 mL    Refill:  0    Order Specific Question:   Supervising Provider    Answer:   Sabra Heck, BRIAN [[id number]]     *If you need refills on other medications prior to your next appointment, please contact your  pharmacy*  Follow-Up: Call back or seek an in-person evaluation if the symptoms worsen or if the condition fails to improve as anticipated.  Other Instructions Sinus Infection, Adult A sinus infection, also called sinusitis, is inflammation of your sinuses. Sinuses are hollow spaces in the bones around your face. Your sinuses are located: Around your eyes. In the middle of your forehead. Behind your nose. In your cheekbones. Mucus normally drains out of your sinuses. When your nasal tissues become inflamed or swollen, mucus can become trapped or blocked. This allows bacteria, viruses, and fungi to grow, which leads to infection. Most infections of the sinuses are caused by a virus. A sinus infection can develop quickly. It can last for up to 4 weeks (acute) or for more than 12 weeks (chronic). A sinus infection often develops after a cold. What are the causes? This condition is caused by anything that creates swelling in the sinuses or stops mucus from draining. This includes: Allergies. Asthma. Infection from bacteria or viruses. Deformities or blockages in your nose or sinuses. Abnormal growths in the nose (nasal polyps). Pollutants, such as chemicals or irritants in the air. Infection from fungi. This is rare. What increases the risk? You are more likely to develop this condition if you: Have a weak body defense system (immune system). Do a lot of swimming or diving. Overuse nasal sprays. Smoke. What are the signs or symptoms? The main symptoms of this condition are pain and a feeling of  pressure around the affected sinuses. Other symptoms include: Stuffy nose or congestion that makes it difficult to breathe through your nose. Thick yellow or greenish drainage from your nose. Tenderness, swelling, and warmth over the affected sinuses. A cough that may get worse at night. Decreased sense of smell and taste. Extra mucus that collects in the throat or the back of the nose  (postnasal drip) causing a sore throat or bad breath. Tiredness (fatigue). Fever. How is this diagnosed? This condition is diagnosed based on: Your symptoms. Your medical history. A physical exam. Tests to find out if your condition is acute or chronic. This may include: Checking your nose for nasal polyps. Viewing your sinuses using a device that has a light (endoscope). Testing for allergies or bacteria. Imaging tests, such as an MRI or CT scan. In rare cases, a bone biopsy may be done to rule out more serious types of fungal sinus disease. How is this treated? Treatment for a sinus infection depends on the cause and whether your condition is chronic or acute. If caused by a virus, your symptoms should go away on their own within 10 days. You may be given medicines to relieve symptoms. They include: Medicines that shrink swollen nasal passages (decongestants). A spray that eases inflammation of the nostrils (topical intranasal corticosteroids). Rinses that help get rid of thick mucus in your nose (nasal saline washes). Medicines that treat allergies (antihistamines). Over-the-counter pain relievers. If caused by bacteria, your health care provider may recommend waiting to see if your symptoms improve. Most bacterial infections will get better without antibiotic medicine. You may be given antibiotics if you have: A severe infection. A weak immune system. If caused by narrow nasal passages or nasal polyps, surgery may be needed. Follow these instructions at home: Medicines Take, use, or apply over-the-counter and prescription medicines only as told by your health care provider. These may include nasal sprays. If you were prescribed an antibiotic medicine, take it as told by your health care provider. Do not stop taking the antibiotic even if you start to feel better. Hydrate and humidify  Drink enough fluid to keep your urine pale yellow. Staying hydrated will help to thin your  mucus. Use a cool mist humidifier to keep the humidity level in your home above 50%. Inhale steam for 10-15 minutes, 3-4 times a day, or as told by your health care provider. You can do this in the bathroom while a hot shower is running. Limit your exposure to cool or dry air. Rest Rest as much as possible. Sleep with your head raised (elevated). Make sure you get enough sleep each night. General instructions  Apply a warm, moist washcloth to your face 3-4 times a day or as told by your health care provider. This will help with discomfort. Use nasal saline washes as often as told by your health care provider. Wash your hands often with soap and water to reduce your exposure to germs. If soap and water are not available, use hand sanitizer. Do not smoke. Avoid being around people who are smoking (secondhand smoke). Keep all follow-up visits. This is important. Contact a health care provider if: You have a fever. Your symptoms get worse. Your symptoms do not improve within 10 days. Get help right away if: You have a severe headache. You have persistent vomiting. You have severe pain or swelling around your face or eyes. You have vision problems. You develop confusion. Your neck is stiff. You have trouble breathing. These symptoms may  be an emergency. Get help right away. Call 911. Do not wait to see if the symptoms will go away. Do not drive yourself to the hospital. Summary A sinus infection is soreness and inflammation of your sinuses. Sinuses are hollow spaces in the bones around your face. This condition is caused by nasal tissues that become inflamed or swollen. The swelling traps or blocks the flow of mucus. This allows bacteria, viruses, and fungi to grow, which leads to infection. If you were prescribed an antibiotic medicine, take it as told by your health care provider. Do not stop taking the antibiotic even if you start to feel better. Keep all follow-up visits. This is  important. This information is not intended to replace advice given to you by your health care provider. Make sure you discuss any questions you have with your health care provider. Document Revised: 02/18/2021 Document Reviewed: 02/18/2021 Elsevier Patient Education  [place].    If you have been instructed to have an in-person evaluation today at a local Urgent Care facility, please use the link below. It will take you to a list of all of our available [facility], including address, phone number and hours of operation. Please do not delay care.  [facility]  If you or a family member do not have a primary care provider, use the link below to schedule a visit and establish care. When you choose a [facility] primary care physician or advanced practice provider, you gain a long-term partner in health. Find a Primary Care Provider  Learn more about [facility]'s in-office and virtual care options: [facility]

## 2021-10-20 NOTE — Progress Notes (Signed)
Virtual Visit Consent   Nicole Osborne, you are scheduled for a virtual visit with a [facility] provider today. Just as with appointments in the office, your consent must be obtained to participate. Your consent will be active for this visit and any virtual visit you may have with one of our providers in the next 365 days. If you have a MyChart account, a copy of this consent can be sent to you electronically.  As this is a virtual visit, video technology does not allow for your provider to perform a traditional examination. This may limit your provider's ability to fully assess your condition. If your provider identifies any concerns that need to be evaluated in person or the need to arrange testing (such as labs, EKG, etc.), we will make arrangements to do so. Although advances in technology are sophisticated, we cannot ensure that it will always work on either your end or our end. If the connection with a video visit is poor, the visit may have to be switched to a telephone visit. With either a video or telephone visit, we are not always able to ensure that we have a secure connection.  By engaging in this virtual visit, you consent to the provision of healthcare and authorize for your insurance to be billed (if applicable) for the services provided during this visit. Depending on your insurance coverage, you may receive a charge related to this service.  I need to obtain your verbal consent now. Are you willing to proceed with your visit today? Nicole Osborne has provided verbal consent on 10/20/2021 for a virtual visit (video or telephone). Mar Daring, PA-C  Date: 10/20/2021 8:38 AM  Virtual Visit via Video Note   I, Mar Daring, connected with  Nicole Osborne  ([id number], [birth year]) on 10/20/21 at  8:30 AM EDT by a video-enabled telemedicine application and verified that I am speaking with the correct person using two identifiers.  Location: Patient: Virtual  Visit Location Patient: Home Provider: Virtual Visit Location Provider: Home Office   I discussed the limitations of evaluation and management by telemedicine and the availability of in person appointments. The patient expressed understanding and agreed to proceed.    History of Present Illness: Nicole Osborne is a 61 y.o. who identifies as a female who was assigned female at birth, and is being seen today for possible sinus infection.  HPI: Sinusitis This is a new problem. The current episode started 1 to 4 weeks ago. The problem has been gradually worsening since onset. There has been no fever. Associated symptoms include congestion, coughing, headaches and sinus pressure. Pertinent negatives include no chills, ear pain or sore throat. Past treatments include acetaminophen. The treatment provided no relief.      Problems:  Patient Active Problem List   Diagnosis Date Noted   Rash 08/14/2021   Severe obstructive sleep apnea 11/30/2020   Shortness of breath 73/22/0254   Colicky RUQ abdominal pain 03/28/2018   Fatty liver disease, nonalcoholic 27/08/2374   Gallbladder sludge 03/28/2018   Atypical chest pain 01/22/2016   Hyperglycemia 07/23/2014   Elevated LFTs 04/25/2014   History of colonoscopy 03/28/2014   Hypertension 03/02/2014   Hyperlipidemia 03/02/2014   Depression, major, single episode, in partial remission ([place]) 03/02/2014    Allergies: No Known Allergies Medications:  Current Outpatient Medications:    AMBULATORY NON FORMULARY MEDICATION, Supply ordered: CPAP and other supplies needed (headgear, cushions, filters, heated tuubing and water chamber) Dx: obstructive sleep apnea Settings:  auto-titration 5-20 cmH2O, Disp: 1 Units, Rfl: 99   amoxicillin-clavulanate (AUGMENTIN) 875-125 MG tablet, Take 1 tablet by mouth 2 (two) times daily., Disp: 14 tablet, Rfl: 0   atorvastatin (LIPITOR) 20 MG tablet, Take 1 tablet (20 mg total) by mouth daily., Disp: 90 tablet, Rfl: 3    ipratropium (ATROVENT) 0.03 % nasal spray, Place 2 sprays into both nostrils every 12 (twelve) hours., Disp: 30 mL, Rfl: 0   lisinopril-hydrochlorothiazide (ZESTORETIC) 20-25 MG tablet, Take 1/2 tablet by mouth daily., Disp: 90 tablet, Rfl: 1   Multiple Vitamin (MULTIVITAMIN) tablet, Take 1 tablet by mouth daily., Disp: , Rfl:   Observations/Objective: Patient is well-developed, well-nourished in no acute distress.  Resting comfortably at home.  Head is normocephalic, atraumatic.  No labored breathing.  Speech is clear and coherent with logical content.  Patient is alert and oriented at baseline.    Assessment and Plan: 1. Acute bacterial sinusitis - amoxicillin-clavulanate (AUGMENTIN) 875-125 MG tablet; Take 1 tablet by mouth 2 (two) times daily.  Dispense: 14 tablet; Refill: 0 - ipratropium (ATROVENT) 0.03 % nasal spray; Place 2 sprays into both nostrils every 12 (twelve) hours.  Dispense: 30 mL; Refill: 0  - Worsening symptoms that have not responded to OTC medications.  - Will give augmentin and ipratropium bromide nasal spray - Continue allergy medications.  - Steam and humidifier can help - Stay well hydrated and get plenty of rest.  - Seek in person evaluation if no symptom improvement or if symptoms worsen   Follow Up Instructions: I discussed the assessment and treatment plan with the patient. The patient was provided an opportunity to ask questions and all were answered. The patient agreed with the plan and demonstrated an understanding of the instructions.  A copy of instructions were sent to the patient via MyChart unless otherwise noted below.    The patient was advised to call back or seek an in-person evaluation if the symptoms worsen or if the condition fails to improve as anticipated.  Time:  I spent 10 minutes with the patient via telehealth technology discussing the above problems/concerns.    Mar Daring, PA-C

## 2021-11-24 IMAGING — DX DG THORACIC SPINE 3V
3 series · 3 of 3 positions shown · non-contrast
Comparison: None.

CLINICAL DATA: Dorsalgia

EXAM:
THORACIC SPINE - 3 VIEWS

[t-spine ap]
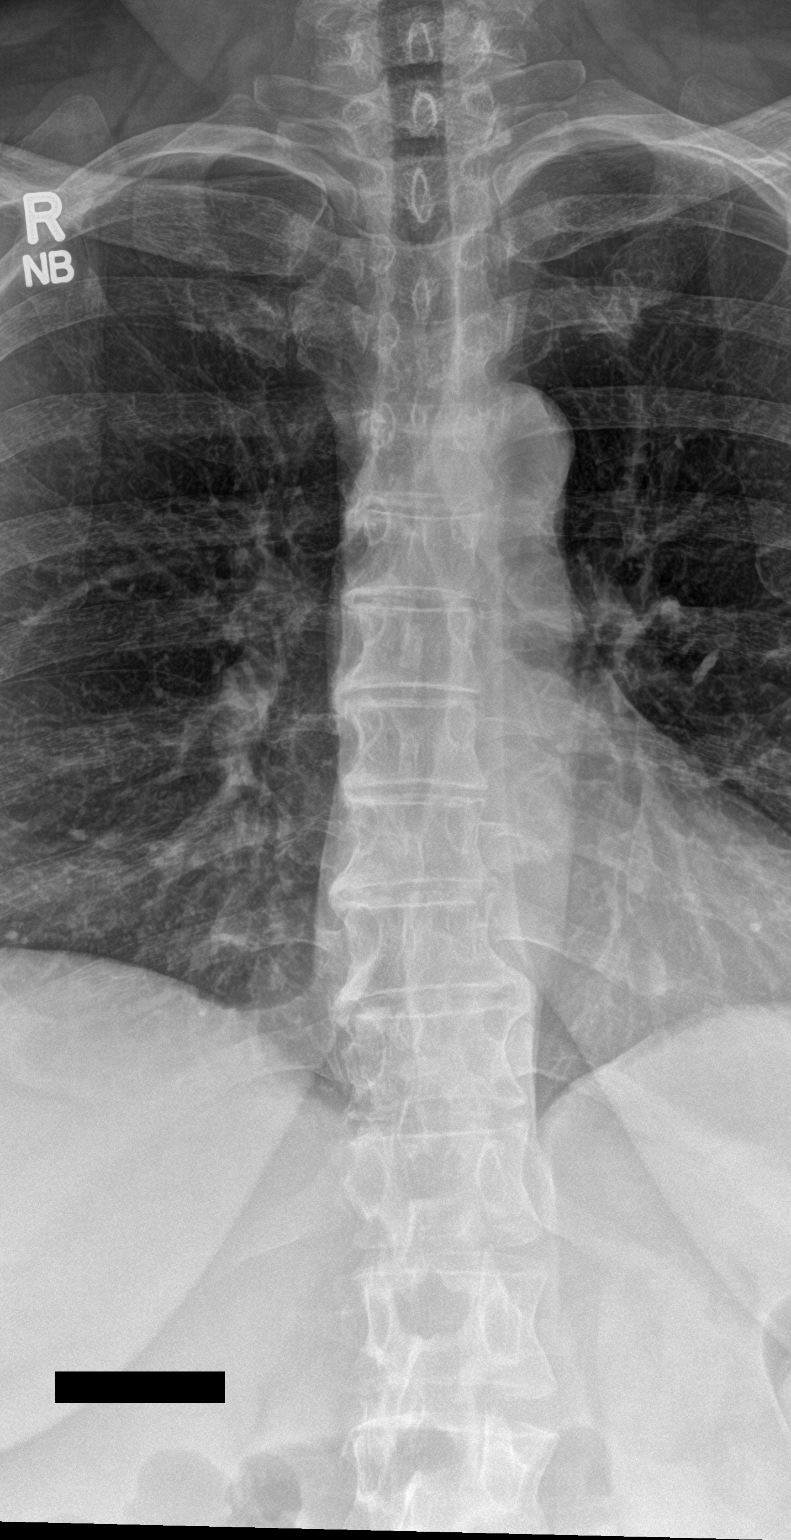

[t-spine lat]
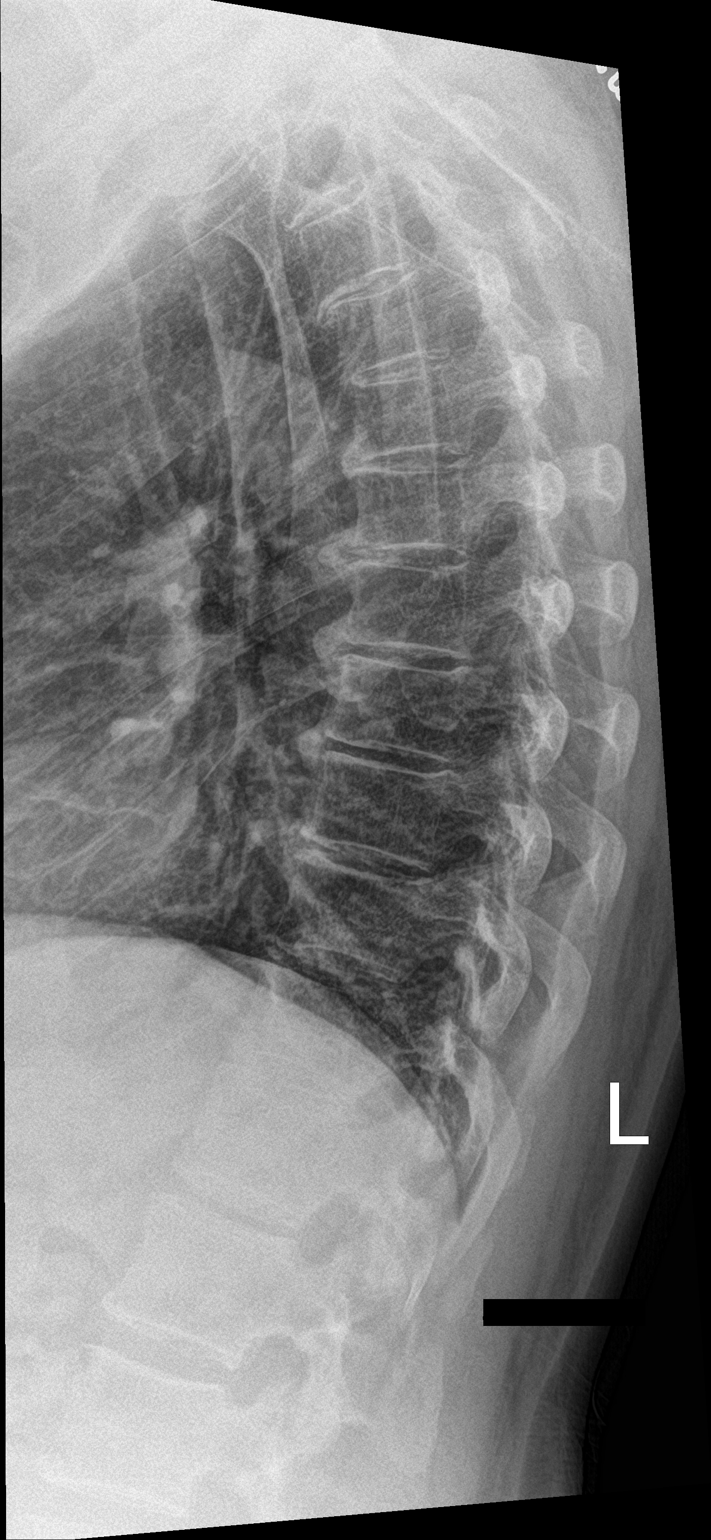

[t-spine swimmers]
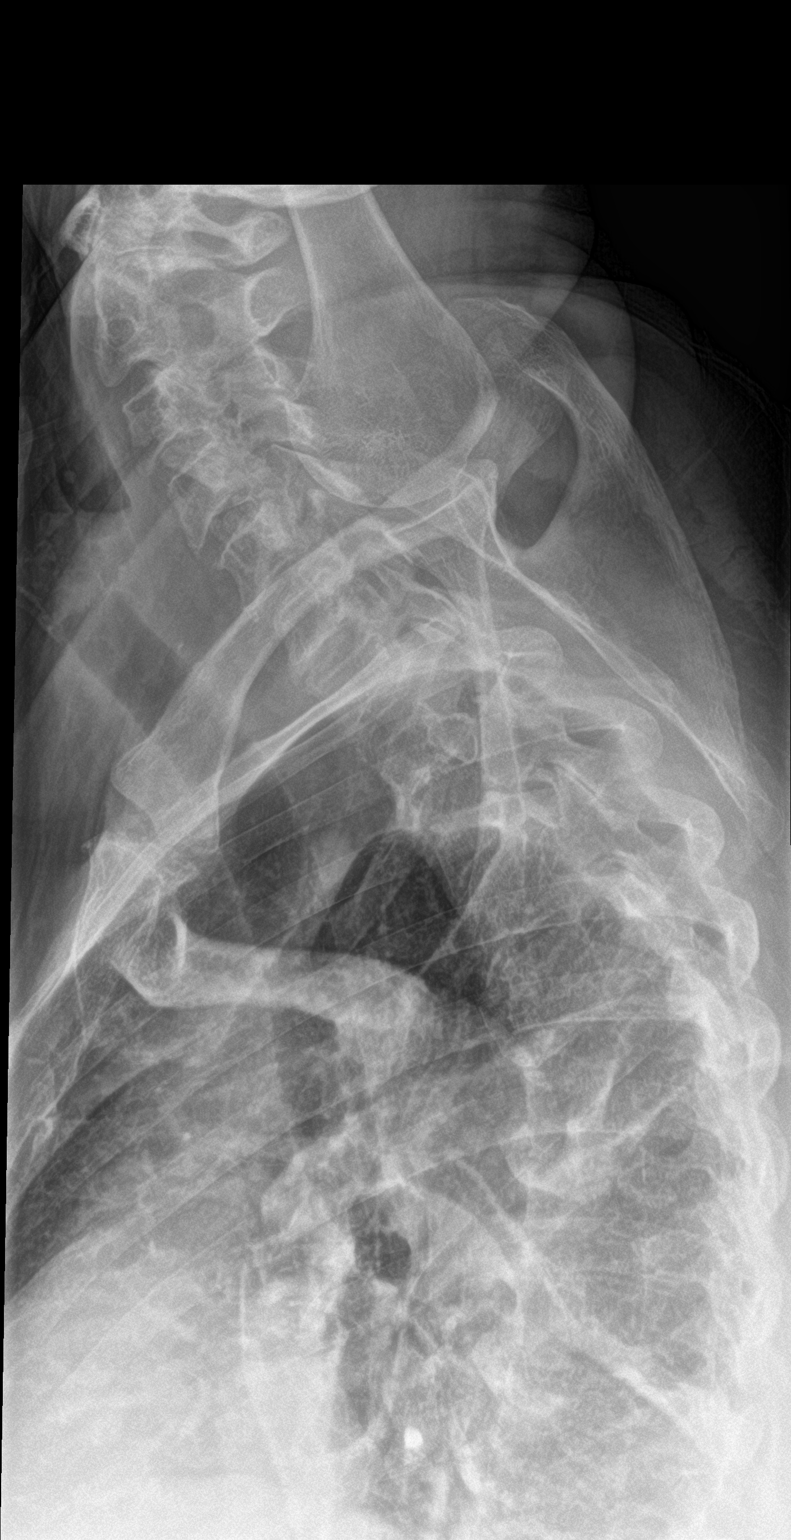

[3 of 3 positions shown; findings below may reference images not displayed]

FINDINGS: Standing frontal, standing lateral, and standing swimmer's views
were obtained. There is slight thoracolumbar levoscoliosis. There is
no fracture or spondylolisthesis. There is mild to moderate disc
space narrowing at several levels in the upper to mid thoracic
region. No erosive change or paraspinous lesion. Visualized lungs
clear.
IMPRESSION: Negative slight lower thoracic scoliosis. Disc space narrowing at
several levels noted. No fracture or spondylolisthesis.

## 2021-11-25 ENCOUNTER — Encounter: Payer: Self-pay | Admitting: Family Medicine

## 2021-11-25 ENCOUNTER — Ambulatory Visit (INDEPENDENT_AMBULATORY_CARE_PROVIDER_SITE_OTHER): Payer: 59 | Admitting: Family Medicine

## 2021-11-25 VITALS — BP 116/71 | HR 60 | Ht 64.0 in | Wt 156.0 lb

## 2021-11-25 DIAGNOSIS — Z0000 Encounter for general adult medical examination without abnormal findings: Secondary | ICD-10-CM | POA: Diagnosis not present

## 2021-11-25 DIAGNOSIS — I10 Essential (primary) hypertension: Secondary | ICD-10-CM

## 2021-11-25 DIAGNOSIS — E782 Mixed hyperlipidemia: Secondary | ICD-10-CM | POA: Diagnosis not present

## 2021-11-25 DIAGNOSIS — R7989 Other specified abnormal findings of blood chemistry: Secondary | ICD-10-CM | POA: Diagnosis not present

## 2021-11-25 NOTE — Progress Notes (Signed)
Nicole Osborne - 61 y.o. female MRN [id number]  Date of birth: June 25, 1960  Subjective Chief Complaint  Patient presents with   Annual Exam    HPI Nicole Osborne is a 61 y.o. female here today for annual exam.  She has made changes to diet and exercise. Her weight is down about 30 lbs since last visit.  She has stopped her BP and cholesterol medications.   She is a non-smoker.  She consumes EtOH occasionally.    She plans to get flu vaccine through her employer.    Review of Systems  Constitutional:  Negative for chills, fever, malaise/fatigue and weight loss.  HENT:  Negative for congestion, ear pain and sore throat.   Eyes:  Negative for blurred vision, double vision and pain.  Respiratory:  Negative for cough and shortness of breath.   Cardiovascular:  Negative for chest pain and palpitations.  Gastrointestinal:  Negative for abdominal pain, blood in stool, constipation, heartburn and nausea.  Genitourinary:  Negative for dysuria and urgency.  Musculoskeletal:  Negative for joint pain and myalgias.  Neurological:  Negative for dizziness and headaches.  Endo/Heme/Allergies:  Does not bruise/bleed easily.  Psychiatric/Behavioral:  Negative for depression. The patient is not nervous/anxious and does not have insomnia.     No Known Allergies  Past Medical History:  Diagnosis Date   Anemia    Colon polyp    Depression    Diverticulosis    Gallbladder sludge    Hypertension    Hypertriglyceridemia     Past Surgical History:  Procedure Laterality Date   BREAST BIOPSY Right    BREAST EXCISIONAL BIOPSY Right 2012   CESAREAN SECTION     x 3   DILATION AND CURETTAGE OF UTERUS     TUBAL LIGATION      Social History   Socioeconomic History   Marital status: Married    Spouse name: Not on file   Number of children: 3   Years of education: Not on file   Highest education level: Not on file  Occupational History   Occupation: RN  Tobacco Use   Smoking  status: Never   Smokeless tobacco: Never  Vaping Use   Vaping Use: Never used  Substance and Sexual Activity   Alcohol use: Yes    Comment: occasional   Drug use: No   Sexual activity: Not on file  Other Topics Concern   Not on file  Social History Narrative   Not on file   Social Determinants of Health   Financial Resource Strain: Not on file  Food Insecurity: Not on file  Transportation Needs: Not on file  Physical Activity: Not on file  Stress: Not on file  Social Connections: Not on file    Family History  Problem Relation Age of Onset   Hypertension Mother    Diabetes Mother    Lung cancer Father    Breast cancer Sister 77   Leukemia Sister    Colon cancer Maternal Grandmother    Lung cancer Sister    CAD Neg Hx     Health Maintenance  Topic Date Due   PAP SMEAR-Modifier  11/28/2021 (Originally 03/12/2021)   COVID-19 Vaccine (3 - Pfizer series) 11/28/2021 (Originally 08/16/2019)   INFLUENZA VACCINE  06/28/2022 (Originally 10/28/2021)   MAMMOGRAM  08/21/2022   COLONOSCOPY (Pts 45-11yr Insurance coverage will need to be confirmed)  02/25/2026   TETANUS/TDAP  08/15/2031   Hepatitis C Screening  Completed   HIV Screening  Completed  Zoster Vaccines- Shingrix  Completed   HPV VACCINES  Aged Out     ----------------------------------------------------------------------------------------------------------------------------------------------------------------------------------------------------------------- Physical Exam BP 116/71 (BP Location: Left Arm, Patient Position: Sitting, Cuff Size: Normal)   Pulse 60   Ht '5\' 4"'$  (1.626 m)   Wt 156 lb (70.8 kg)   SpO2 99%   BMI 26.78 kg/m   Physical Exam Constitutional:      General: She is not in acute distress. HENT:     Head: Normocephalic and atraumatic.     Right Ear: Tympanic membrane and ear canal normal.     Left Ear: Tympanic membrane and ear canal normal.     Nose: Nose normal.  Eyes:     General: No  scleral icterus.    Conjunctiva/sclera: Conjunctivae normal.  Neck:     Thyroid: No thyromegaly.  Cardiovascular:     Rate and Rhythm: Normal rate and regular rhythm.     Heart sounds: Normal heart sounds.  Pulmonary:     Effort: Pulmonary effort is normal.     Breath sounds: Normal breath sounds.  Abdominal:     General: Bowel sounds are normal. There is no distension.     Palpations: Abdomen is soft.     Tenderness: There is no abdominal tenderness. There is no guarding.  Musculoskeletal:        General: Normal range of motion.     Cervical back: Normal range of motion and neck supple.  Lymphadenopathy:     Cervical: No cervical adenopathy.  Skin:    General: Skin is warm and dry.     Findings: No rash.  Neurological:     General: No focal deficit present.     Mental Status: She is alert and oriented to person, place, and time.     Cranial Nerves: No cranial nerve deficit.     Coordination: Coordination normal.  Psychiatric:        Mood and Affect: Mood normal.        Behavior: Behavior normal.     ------------------------------------------------------------------------------------------------------------------------------------------------------------------------------------------------------------------- Assessment and Plan  Well adult exam Well adult Orders Placed This Encounter  Procedures   COMPLETE METABOLIC PANEL WITH GFR   CBC with Differential   Lipid Panel w/reflex Direct LDL   TSH  Immunizations: She will have flu completed with her employer Screenings: per lab orders Anticipatory guidance/risk factor reduction:  Recommendations per AVS   No orders of the defined types were placed in this encounter.   No follow-ups on file.    This visit occurred during the SARS-CoV-2 public health emergency.  Safety protocols were in place, including screening questions prior to the visit, additional usage of staff PPE, and extensive cleaning of exam room while  observing appropriate contact time as indicated for disinfecting solutions.

## 2021-11-25 NOTE — Patient Instructions (Signed)
Preventive Care 40-62 Years Old, Female Preventive care refers to lifestyle choices and visits with your health care provider that can promote health and wellness. Preventive care visits are also called wellness exams. What can I expect for my preventive care visit? Counseling Your health care provider may ask you questions about your: Medical history, including: Past medical problems. Family medical history. Pregnancy history. Current health, including: Menstrual cycle. Method of birth control. Emotional well-being. Home life and relationship well-being. Sexual activity and sexual health. Lifestyle, including: Alcohol, nicotine or tobacco, and drug use. Access to firearms. Diet, exercise, and sleep habits. Work and work environment. Sunscreen use. Safety issues such as seatbelt and bike helmet use. Physical exam Your health care provider will check your: Height and weight. These may be used to calculate your BMI (body mass index). BMI is a measurement that tells if you are at a healthy weight. Waist circumference. This measures the distance around your waistline. This measurement also tells if you are at a healthy weight and may help predict your risk of certain diseases, such as type 2 diabetes and high blood pressure. Heart rate and blood pressure. Body temperature. Skin for abnormal spots. What immunizations do I need?  Vaccines are usually given at various ages, according to a schedule. Your health care provider will recommend vaccines for you based on your age, medical history, and lifestyle or other factors, such as travel or where you work. What tests do I need? Screening Your health care provider may recommend screening tests for certain conditions. This may include: Lipid and cholesterol levels. Diabetes screening. This is done by checking your blood sugar (glucose) after you have not eaten for a while (fasting). Pelvic exam and Pap test. Hepatitis B test. Hepatitis C  test. HIV (human immunodeficiency virus) test. STI (sexually transmitted infection) testing, if you are at risk. Lung cancer screening. Colorectal cancer screening. Mammogram. Talk with your health care provider about when you should start having regular mammograms. This may depend on whether you have a family history of breast cancer. BRCA-related cancer screening. This may be done if you have a family history of breast, ovarian, tubal, or peritoneal cancers. Bone density scan. This is done to screen for osteoporosis. Talk with your health care provider about your test results, treatment options, and if necessary, the need for more tests. Follow these instructions at home: Eating and drinking  Eat a diet that includes fresh fruits and vegetables, whole grains, lean protein, and low-fat dairy products. Take vitamin and mineral supplements as recommended by your health care provider. Do not drink alcohol if: Your health care provider tells you not to drink. You are pregnant, may be pregnant, or are planning to become pregnant. If you drink alcohol: Limit how much you have to 0-1 drink a day. Know how much alcohol is in your drink. In the [place] one drink equals one 12 oz bottle of beer (355 mL), one 5 oz glass of wine (148 mL), or one 1 oz glass of hard liquor (44 mL). Lifestyle Brush your teeth every morning and night with fluoride toothpaste. Floss one time each day. Exercise for at least 30 minutes 5 or more days each week. Do not use any products that contain nicotine or tobacco. These products include cigarettes, chewing tobacco, and vaping devices, such as e-cigarettes. If you need help quitting, ask your health care provider. Do not use drugs. If you are sexually active, practice safe sex. Use a condom or other form of protection to   prevent STIs. If you do not wish to become pregnant, use a form of birth control. If you plan to become pregnant, see your health care provider for a  prepregnancy visit. Take aspirin only as told by your health care provider. Make sure that you understand how much to take and what form to take. Work with your health care provider to find out whether it is safe and beneficial for you to take aspirin daily. Find healthy ways to manage stress, such as: Meditation, yoga, or listening to music. Journaling. Talking to a trusted person. Spending time with friends and family. Minimize exposure to UV radiation to reduce your risk of skin cancer. Safety Always wear your seat belt while driving or riding in a vehicle. Do not drive: If you have been drinking alcohol. Do not ride with someone who has been drinking. When you are tired or distracted. While texting. If you have been using any mind-altering substances or drugs. Wear a helmet and other protective equipment during sports activities. If you have firearms in your house, make sure you follow all gun safety procedures. Seek help if you have been physically or sexually abused. What's next? Visit your health care provider once a year for an annual wellness visit. Ask your health care provider how often you should have your eyes and teeth checked. Stay up to date on all vaccines. This information is not intended to replace advice given to you by your health care provider. Make sure you discuss any questions you have with your health care provider. Document Revised: 09/11/2020 Document Reviewed: 09/11/2020 Elsevier Patient Education  [place].

## 2021-11-25 NOTE — Assessment & Plan Note (Signed)
Well adult Orders Placed This Encounter  Procedures  . COMPLETE METABOLIC PANEL WITH GFR  . CBC with Differential  . Lipid Panel w/reflex Direct LDL  . TSH  Immunizations: She will have flu completed with her employer Screenings: per lab orders Anticipatory guidance/risk factor reduction:  Recommendations per AVS

## 2021-12-03 DIAGNOSIS — Z0000 Encounter for general adult medical examination without abnormal findings: Secondary | ICD-10-CM | POA: Diagnosis not present

## 2021-12-03 DIAGNOSIS — E782 Mixed hyperlipidemia: Secondary | ICD-10-CM | POA: Diagnosis not present

## 2021-12-03 DIAGNOSIS — I10 Essential (primary) hypertension: Secondary | ICD-10-CM | POA: Diagnosis not present

## 2021-12-03 DIAGNOSIS — R7989 Other specified abnormal findings of blood chemistry: Secondary | ICD-10-CM | POA: Diagnosis not present

## 2021-12-04 LAB — LIPID PANEL W/REFLEX DIRECT LDL
Cholesterol: 226 mg/dL — ABNORMAL HIGH (ref <200)
HDL: 61 mg/dL (ref >=50)
LDL Cholesterol (Calc): 141 mg/dL (calc) — ABNORMAL HIGH
Non-HDL Cholesterol (Calc): 165 mg/dL (calc) — ABNORMAL HIGH (ref <130)
Total CHOL/HDL Ratio: 3.7 (calc) (ref <5.0)
Triglycerides: 121 mg/dL (ref <150)

## 2021-12-04 LAB — COMPLETE METABOLIC PANEL WITH GFR
AG Ratio: 1.8 (calc) (ref 1.0–2.5)
ALT: 13 U/L (ref 6–29)
AST: 24 U/L (ref 10–35)
Albumin: 4.3 g/dL (ref 3.6–5.1)
Alkaline phosphatase (APISO): 89 U/L (ref 37–153)
BUN: 14 mg/dL (ref 7–25)
CO2: 27 mmol/L (ref 20–32)
Calcium: 9.8 mg/dL (ref 8.6–10.4)
Chloride: 104 mmol/L (ref 98–110)
Creat: 0.62 mg/dL (ref 0.50–1.05)
Globulin: 2.4 g/dL (calc) (ref 1.9–3.7)
Glucose, Bld: 105 mg/dL — ABNORMAL HIGH (ref 65–99)
Potassium: 4.9 mmol/L (ref 3.5–5.3)
Sodium: 139 mmol/L (ref 135–146)
Total Bilirubin: 1 mg/dL (ref 0.2–1.2)
Total Protein: 6.7 g/dL (ref 6.1–8.1)
eGFR: 101 mL/min/{1.73_m2} (ref >=60)

## 2021-12-04 LAB — CBC WITH DIFFERENTIAL/PLATELET
Absolute Monocytes: 428 cells/uL (ref 200–950)
Basophils Absolute: 32 cells/uL (ref 0–200)
Basophils Relative: 0.5 %
Eosinophils Absolute: 170 cells/uL (ref 15–500)
Eosinophils Relative: 2.7 %
HCT: 45.4 % — ABNORMAL HIGH (ref 35.0–45.0)
Hemoglobin: 14.7 g/dL (ref 11.7–15.5)
Lymphs Abs: 2249 cells/uL (ref 850–3900)
MCH: 28.4 pg (ref 27.0–33.0)
MCHC: 32.4 g/dL (ref 32.0–36.0)
MCV: 87.6 fL (ref 80.0–100.0)
MPV: 11.2 fL (ref 7.5–12.5)
Monocytes Relative: 6.8 %
Neutro Abs: 3421 cells/uL (ref 1500–7800)
Neutrophils Relative %: 54.3 %
Platelets: 244 10*3/uL (ref 140–400)
RBC: 5.18 10*6/uL — ABNORMAL HIGH (ref 3.80–5.10)
RDW: 12.8 % (ref 11.0–15.0)
Total Lymphocyte: 35.7 %
WBC: 6.3 10*3/uL (ref 3.8–10.8)

## 2021-12-04 LAB — TSH: TSH: 1.49 mIU/L (ref 0.40–4.50)

## 2022-02-17 ENCOUNTER — Other Ambulatory Visit (HOSPITAL_BASED_OUTPATIENT_CLINIC_OR_DEPARTMENT_OTHER): Payer: Self-pay

## 2022-02-17 ENCOUNTER — Other Ambulatory Visit (HOSPITAL_COMMUNITY)
Admission: RE | Admit: 2022-02-17 | Discharge: 2022-02-17 | Disposition: A | Discharge status: Home / Self Care | Payer: 59 | Source: Ambulatory Visit | Attending: Family Medicine | Admitting: Family Medicine

## 2022-02-17 ENCOUNTER — Ambulatory Visit (INDEPENDENT_AMBULATORY_CARE_PROVIDER_SITE_OTHER): Payer: 59 | Admitting: Family Medicine

## 2022-02-17 ENCOUNTER — Encounter: Payer: Self-pay | Admitting: Family Medicine

## 2022-02-17 VITALS — BP 123/73 | HR 58 | Ht 64.0 in | Wt 162.0 lb

## 2022-02-17 DIAGNOSIS — I10 Essential (primary) hypertension: Secondary | ICD-10-CM

## 2022-02-17 DIAGNOSIS — E782 Mixed hyperlipidemia: Secondary | ICD-10-CM

## 2022-02-17 DIAGNOSIS — Z124 Encounter for screening for malignant neoplasm of cervix: Secondary | ICD-10-CM | POA: Diagnosis not present

## 2022-02-17 MED ORDER — LISINOPRIL-HYDROCHLOROTHIAZIDE 20-25 MG PO TABS
0.5000 | ORAL_TABLET | Freq: Every day | ORAL | 1 refills | Status: DC
  Filled 2022-02-17 – 2022-03-03 (×2): qty 45, 90d supply, fill #0
  Filled 2022-08-02: qty 45, 90d supply, fill #1
  Filled 2022-12-23: qty 45, 90d supply, fill #2

## 2022-02-17 MED ORDER — ATORVASTATIN CALCIUM 20 MG PO TABS
20.0000 mg | ORAL_TABLET | Freq: Every day | ORAL | 3 refills | Status: DC
  Filled 2022-02-17 – 2022-03-03 (×2): qty 90, 90d supply, fill #0
  Filled 2022-08-02: qty 90, 90d supply, fill #1
  Filled 2022-12-23: qty 90, 90d supply, fill #2

## 2022-02-17 NOTE — Progress Notes (Addendum)
Nicole Osborne - 61 y.o. female MRN [id number]  Date of birth: Oct 24, 1960  Subjective Chief Complaint  Patient presents with   Annual Exam     HPI Nicole Osborne is a 61 y.o. female here today pap.  She has had her annual exam in August and is here today for pap.   Cholesterol noted to be elevated on recent lipid panel. She has restarted atorvastatin.  Tolerating this well.   BP remains well controlled.   Due for Pap. Mammogram and colon cancer screening are up to date.   Review of Systems  Genitourinary: Negative.     No Known Allergies  Past Medical History:  Diagnosis Date   Anemia    Colon polyp    Depression    Diverticulosis    Gallbladder sludge    Hypertension    Hypertriglyceridemia     Past Surgical History:  Procedure Laterality Date   BREAST BIOPSY Right    BREAST EXCISIONAL BIOPSY Right 2012   CESAREAN SECTION     x 3   DILATION AND CURETTAGE OF UTERUS     TUBAL LIGATION      Social History   Socioeconomic History   Marital status: Married    Spouse name: Not on file   Number of children: 3   Years of education: Not on file   Highest education level: Not on file  Occupational History   Occupation: RN  Tobacco Use   Smoking status: Never   Smokeless tobacco: Never  Vaping Use   Vaping Use: Never used  Substance and Sexual Activity   Alcohol use: Yes    Comment: occasional   Drug use: No   Sexual activity: Not on file  Other Topics Concern   Not on file  Social History Narrative   Not on file   Social Determinants of Health   Financial Resource Strain: Not on file  Food Insecurity: Not on file  Transportation Needs: Not on file  Physical Activity: Not on file  Stress: Not on file  Social Connections: Not on file    Family History  Problem Relation Age of Onset   Hypertension Mother    Diabetes Mother    Lung cancer Father    Breast cancer Sister 37   Leukemia Sister    Colon cancer Maternal Grandmother     Lung cancer Sister    CAD Neg Hx     Health Maintenance  Topic Date Due   PAP SMEAR-Modifier  03/12/2021   COVID-19 Vaccine (3 - 2023-24 season) 03/05/2022 (Originally 11/28/2021)   INFLUENZA VACCINE  06/28/2022 (Originally 10/28/2021)   MAMMOGRAM  08/21/2022   COLONOSCOPY (Pts 45-56yr Insurance coverage will need to be confirmed)  02/25/2026   Hepatitis C Screening  Completed   HIV Screening  Completed   Zoster Vaccines- Shingrix  Completed   HPV VACCINES  Aged Out     ----------------------------------------------------------------------------------------------------------------------------------------------------------------------------------------------------------------- Physical Exam BP 123/73 (BP Location: Left Arm, Patient Position: Sitting, Cuff Size: Normal)   Pulse (!) 58   Ht '5\' 4"'$  (1.626 m)   Wt 162 lb (73.5 kg)   SpO2 99%   BMI 27.81 kg/m   Physical Exam Exam conducted with a chaperone present (Leontine Locket CMA).  Genitourinary:    General: Normal vulva.     Pubic Area: No rash.      Vagina: Normal.     Cervix: Normal.     Uterus: Normal.      Adnexa: Right adnexa normal and  left adnexa normal.     Comments: Mild vulvovaginal atrophy noted.     ------------------------------------------------------------------------------------------------------------------------------------------------------------------------------------------------------------------- Assessment and Plan  Screening for cervical cancer PAP completed today.  If this is normal this should be the last screening that she needs.    Hypertension BP is well controlled.  Continue current medication.  Follow up in 6 months.   Hyperlipidemia She had been off atorvastatin but has recently restarted.  Will continue.  Updated prescription sent in.    Meds ordered this encounter  Medications   lisinopril-hydrochlorothiazide (ZESTORETIC) 20-25 MG tablet    Sig: Take 1/2 tablet by mouth daily.     Dispense:  90 tablet    Refill:  1   atorvastatin (LIPITOR) 20 MG tablet    Sig: Take 1 tablet (20 mg total) by mouth daily.    Dispense:  90 tablet    Refill:  3    No follow-ups on file.    This visit occurred during the SARS-CoV-2 public health emergency.  Safety protocols were in place, including screening questions prior to the visit, additional usage of staff PPE, and extensive cleaning of exam room while observing appropriate contact time as indicated for disinfecting solutions.

## 2022-02-17 NOTE — Assessment & Plan Note (Deleted)
Well adult No orders of the defined types were placed in this encounter. Screenings: per lab orders Immunizations:  Declines recommended vaccines today.  Anticipatory guidance/Risk factor reduction:  Recommendations per AVS.

## 2022-02-17 NOTE — Assessment & Plan Note (Signed)
PAP completed today.  If this is normal this should be the last screening that she needs.

## 2022-02-17 NOTE — Addendum Note (Signed)
Addended by: Perlie Mayo on: 02/17/2022 09:01 AM   Modules accepted: Orders, Level of Service

## 2022-02-17 NOTE — Assessment & Plan Note (Signed)
BP is well controlled.  Continue current medication.  Follow up in 6 months.

## 2022-02-17 NOTE — Assessment & Plan Note (Signed)
She had been off atorvastatin but has recently restarted.  Will continue.  Updated prescription sent in.

## 2022-02-20 LAB — CYTOLOGY - PAP
Comment: NEGATIVE
Diagnosis: NEGATIVE
High risk HPV: NEGATIVE

## 2022-02-23 ENCOUNTER — Encounter (HOSPITAL_BASED_OUTPATIENT_CLINIC_OR_DEPARTMENT_OTHER): Payer: Self-pay

## 2022-02-27 ENCOUNTER — Other Ambulatory Visit (HOSPITAL_BASED_OUTPATIENT_CLINIC_OR_DEPARTMENT_OTHER): Payer: Self-pay

## 2022-03-03 ENCOUNTER — Other Ambulatory Visit (HOSPITAL_BASED_OUTPATIENT_CLINIC_OR_DEPARTMENT_OTHER): Payer: Self-pay

## 2022-08-03 ENCOUNTER — Other Ambulatory Visit (HOSPITAL_BASED_OUTPATIENT_CLINIC_OR_DEPARTMENT_OTHER): Payer: Self-pay

## 2022-10-18 ENCOUNTER — Ambulatory Visit
Admission: RE | Admit: 2022-10-18 | Discharge: 2022-10-18 | Disposition: A | Discharge status: Home / Self Care | Payer: Commercial Managed Care - PPO | Source: Ambulatory Visit | Attending: Family Medicine | Admitting: Family Medicine

## 2022-10-18 VITALS — BP 128/81 | HR 92 | Temp 98.9°F | Resp 18 | Ht 64.0 in | Wt 185.0 lb

## 2022-10-18 DIAGNOSIS — J019 Acute sinusitis, unspecified: Secondary | ICD-10-CM | POA: Diagnosis not present

## 2022-10-18 DIAGNOSIS — R058 Other specified cough: Secondary | ICD-10-CM

## 2022-10-18 MED ORDER — AMOXICILLIN-POT CLAVULANATE 875-125 MG PO TABS: 1.0000 | ORAL_TABLET | Freq: Two times a day (BID) | ORAL | 0 refills | Status: DC

## 2022-10-18 MED ORDER — PREDNISONE 50 MG PO TABS: ORAL_TABLET | ORAL | 0 refills | Status: DC

## 2022-10-18 NOTE — ED Triage Notes (Signed)
Patient c/o productive cough x 1 month, COVID a month ago.  Headache, ears, gland are painful, possible sinus infection.  Patient has taken Motrin, Tylenol and Halls.

## 2022-10-18 NOTE — ED Provider Notes (Signed)
[facility]    CSN: [id number] Arrival date & time: 10/18/22  1139      History   Chief Complaint Chief Complaint  Patient presents with   Headache    Sinus infection - Entered by patient    HPI Nicole Osborne is a 62 y.o. female.   Patient is a nurse at the hospital.  She states she had COVID approximately a month ago.  She has never completely recovered.  She has a persistent cough.  Over the last week she has had increased coughing, sinus congestion, postnasal drip, and blood-streaked mucus.  She is feeling very tired.  She questions whether she may need an antibiotic.  She has a harsh cough and some shortness of breath.  No underlying lung disease    Past Medical History:  Diagnosis Date   Anemia    Colon polyp    Depression    Diverticulosis    Gallbladder sludge    Hypertension    Hypertriglyceridemia     Patient Active Problem List   Diagnosis Date Noted   Screening for cervical cancer 02/17/2022   Well adult exam 11/25/2021   Rash 08/14/2021   Severe obstructive sleep apnea 11/30/2020   Shortness of breath 05/30/2019   Colicky RUQ abdominal pain 03/28/2018   Fatty liver disease, nonalcoholic 03/28/2018   Gallbladder sludge 03/28/2018   Atypical chest pain 01/22/2016   Hyperglycemia 07/23/2014   Elevated LFTs 04/25/2014   History of colonoscopy 03/28/2014   Hypertension 03/02/2014   Hyperlipidemia 03/02/2014   Depression, major, single episode, in partial remission (HCC) 03/02/2014    Past Surgical History:  Procedure Laterality Date   BREAST BIOPSY Right    BREAST EXCISIONAL BIOPSY Right 2012   CESAREAN SECTION     x 3   DILATION AND CURETTAGE OF UTERUS     TUBAL LIGATION      OB History   No obstetric history on file.      Home Medications    Prior to Admission medications   Medication Sig Start Date End Date Taking? Authorizing Provider  amoxicillin-clavulanate (AUGMENTIN) 875-125 MG tablet Take 1 tablet by mouth  every 12 (twelve) hours. 10/18/22  Yes Eustace Moore, MD  atorvastatin (LIPITOR) 20 MG tablet Take 1 tablet (20 mg total) by mouth daily. 02/17/22  Yes Everrett Coombe, DO  lisinopril-hydrochlorothiazide (ZESTORETIC) 20-25 MG tablet Take 1/2 tablet by mouth daily. 02/17/22 02/17/23 Yes Everrett Coombe, DO  predniSONE (DELTASONE) 50 MG tablet Take once a day for 5 days.  Take with food 10/18/22  Yes Eustace Moore, MD    Family History Family History  Problem Relation Age of Onset   Hypertension Mother    Diabetes Mother    Lung cancer Father    Breast cancer Sister 74   Leukemia Sister    Lung cancer Sister    Colon cancer Maternal Grandmother    CAD Neg Hx     Social History Social History   Tobacco Use   Smoking status: Never   Smokeless tobacco: Never  Vaping Use   Vaping status: Never Used  Substance Use Topics   Alcohol use: Yes    Comment: occasional   Drug use: No     Allergies   Patient has no known allergies.   Review of Systems Review of Systems  See HPI Physical Exam Triage Vital Signs ED Triage Vitals  Encounter Vitals Group     BP 10/18/22 1217 128/81  Systolic BP Percentile --      Diastolic BP Percentile --      Pulse Rate 10/18/22 1217 92     Resp 10/18/22 1217 18     Temp 10/18/22 1217 98.9 F (37.2 C)     Temp Source 10/18/22 1217 Oral     SpO2 10/18/22 1217 97 %     Weight 10/18/22 1219 185 lb (83.9 kg)     Height 10/18/22 1219 5\' 4"  (1.626 m)     Head Circumference --      Peak Flow --      Pain Score 10/18/22 1219 3     Pain Loc --      Pain Education --      Exclude from Growth Chart --    No data found.  Updated Vital Signs BP 128/81 (BP Location: Left Arm)   Pulse 92   Temp 98.9 F (37.2 C) (Oral)   Resp 18   Ht 5\' 4"  (1.626 m)   Wt 83.9 kg   SpO2 97%   BMI 31.76 kg/m     Physical Exam Constitutional:      General: She is not in acute distress.    Appearance: She is well-developed and normal weight. She  is ill-appearing.  HENT:     Head: Normocephalic and atraumatic.     Right Ear: Tympanic membrane and ear canal normal.     Left Ear: Tympanic membrane and ear canal normal.     Nose: Congestion and rhinorrhea present.     Mouth/Throat:     Pharynx: Posterior oropharyngeal erythema present.  Eyes:     Conjunctiva/sclera: Conjunctivae normal.     Pupils: Pupils are equal, round, and reactive to light.  Cardiovascular:     Rate and Rhythm: Normal rate and regular rhythm.     Heart sounds: Normal heart sounds.  Pulmonary:     Effort: Pulmonary effort is normal. No respiratory distress.     Breath sounds: Wheezing and rhonchi present.  Abdominal:     General: There is no distension.     Palpations: Abdomen is soft.  Musculoskeletal:        General: Normal range of motion.     Cervical back: Normal range of motion.  Lymphadenopathy:     Cervical: Cervical adenopathy present.  Skin:    General: Skin is warm and dry.  Neurological:     Mental Status: She is alert.      UC Treatments / Results  Labs (all labs ordered are listed, but only abnormal results are displayed) Labs Reviewed - No data to display  EKG   Radiology No results found.  Procedures Procedures (including critical care time)  Medications Ordered in UC Medications - No data to display  Initial Impression / Assessment and Plan / UC Course  I have reviewed the triage vital signs and the nursing notes.  Pertinent labs & imaging results that were available during my care of the patient were reviewed by me and considered in my medical decision making (see chart for details).     I explained to the patient that after virus like influenza or COVID it is possible to end up with a bacterial infection because of reduced immunity and inflamed mucous membranes.  I do feel like an antibiotic is appropriate at this time.  This is because of the wheezing. Final Clinical Impressions(s) / UC Diagnoses   Final  diagnoses:  Post-viral cough syndrome  Acute sinusitis, recurrence not specified,  unspecified location     Discharge Instructions      Continue to drink lots of fluids Take the Augmentin 2 times a day.  Consider taking a probiotic while on Augmentin Take prednisone daily for 5 days May use over-the-counter cough cold medicine such as Mucinex DM Call if not improving in a few days   ED Prescriptions     Medication Sig Dispense Auth. Provider   amoxicillin-clavulanate (AUGMENTIN) 875-125 MG tablet Take 1 tablet by mouth every 12 (twelve) hours. 14 tablet Eustace Moore, MD   predniSONE (DELTASONE) 50 MG tablet Take once a day for 5 days.  Take with food 5 tablet Eustace Moore, MD      PDMP not reviewed this encounter.   Eustace Moore, MD 10/18/22 1341

## 2022-10-18 NOTE — Discharge Instructions (Signed)
Continue to drink lots of fluids Take the Augmentin 2 times a day.  Consider taking a probiotic while on Augmentin Take prednisone daily for 5 days May use over-the-counter cough cold medicine such as Mucinex DM Call if not improving in a few days

## 2022-10-27 ENCOUNTER — Encounter: Payer: Self-pay | Admitting: Family Medicine

## 2022-10-27 ENCOUNTER — Ambulatory Visit (INDEPENDENT_AMBULATORY_CARE_PROVIDER_SITE_OTHER): Payer: Commercial Managed Care - PPO | Admitting: Family Medicine

## 2022-10-27 VITALS — BP 105/68 | HR 82 | Ht 64.0 in | Wt 195.0 lb

## 2022-10-27 DIAGNOSIS — I10 Essential (primary) hypertension: Secondary | ICD-10-CM

## 2022-10-27 DIAGNOSIS — Z1231 Encounter for screening mammogram for malignant neoplasm of breast: Secondary | ICD-10-CM

## 2022-10-27 DIAGNOSIS — Z0000 Encounter for general adult medical examination without abnormal findings: Secondary | ICD-10-CM

## 2022-10-27 DIAGNOSIS — E782 Mixed hyperlipidemia: Secondary | ICD-10-CM | POA: Diagnosis not present

## 2022-10-27 DIAGNOSIS — R739 Hyperglycemia, unspecified: Secondary | ICD-10-CM

## 2022-10-27 NOTE — Progress Notes (Signed)
Nicole Osborne - 62 y.o. female MRN [id number]  Date of birth: Apr 24, 1960  Subjective Chief Complaint  Patient presents with   Annual Exam    HPI Nicole Osborne is a 62 y.o. female here today for annual exam.   She reports that she is doing well .  Doing well with current medications.   She is moderately active. She feels like diet is pretty good.   She is a non-smoker.  Rare EtOH use.   Due for mammogram.   Review of Systems  Constitutional:  Negative for chills, fever, malaise/fatigue and weight loss.  HENT:  Negative for congestion, ear pain and sore throat.   Eyes:  Negative for blurred vision, double vision and pain.  Respiratory:  Negative for cough and shortness of breath.   Cardiovascular:  Negative for chest pain and palpitations.  Gastrointestinal:  Negative for abdominal pain, blood in stool, constipation, heartburn and nausea.  Genitourinary:  Negative for dysuria and urgency.  Musculoskeletal:  Negative for joint pain and myalgias.  Neurological:  Negative for dizziness and headaches.  Endo/Heme/Allergies:  Does not bruise/bleed easily.  Psychiatric/Behavioral:  Negative for depression. The patient is not nervous/anxious and does not have insomnia.      No Known Allergies  Past Medical History:  Diagnosis Date   Anemia    Colon polyp    Depression    Diverticulosis    Gallbladder sludge    Hypertension    Hypertriglyceridemia     Past Surgical History:  Procedure Laterality Date   BREAST BIOPSY Right    BREAST EXCISIONAL BIOPSY Right 2012   CESAREAN SECTION     x 3   DILATION AND CURETTAGE OF UTERUS     TUBAL LIGATION      Social History   Socioeconomic History   Marital status: Married    Spouse name: Not on file   Number of children: 3   Years of education: Not on file   Highest education level: Not on file  Occupational History   Occupation: RN  Tobacco Use   Smoking status: Never   Smokeless tobacco: Never  Vaping Use    Vaping status: Never Used  Substance and Sexual Activity   Alcohol use: Yes    Comment: occasional   Drug use: No   Sexual activity: Not on file  Other Topics Concern   Not on file  Social History Narrative   Not on file   Social Determinants of Health   Financial Resource Strain: Not on file  Food Insecurity: Not on file  Transportation Needs: Not on file  Physical Activity: Not on file  Stress: Not on file  Social Connections: Not on file    Family History  Problem Relation Age of Onset   Hypertension Mother    Diabetes Mother    Lung cancer Father    Breast cancer Sister 41   Leukemia Sister    Lung cancer Sister    Colon cancer Maternal Grandmother    CAD Neg Hx     Health Maintenance  Topic Date Due   MAMMOGRAM  08/21/2022   COVID-19 Vaccine (3 - 2023-24 season) 11/12/2022 (Originally 11/28/2021)   INFLUENZA VACCINE  10/29/2022   Colonoscopy  02/25/2026   PAP SMEAR-Modifier  02/18/2027   DTaP/Tdap/Td (2 - Td or Tdap) 08/15/2031   Hepatitis C Screening  Completed   HIV Screening  Completed   Zoster Vaccines- Shingrix  Completed   HPV VACCINES  Aged Out     -----------------------------------------------------------------------------------------------------------------------------------------------------------------------------------------------------------------  Physical Exam BP 105/68 (BP Location: Left Arm, Patient Position: Sitting, Cuff Size: Normal)   Pulse 82   Ht 5\' 4"  (1.626 m)   Wt 195 lb (88.5 kg)   SpO2 96%   BMI 33.47 kg/m   Physical Exam Constitutional:      General: She is not in acute distress. HENT:     Head: Normocephalic and atraumatic.     Right Ear: Tympanic membrane and ear canal normal.     Left Ear: Tympanic membrane and ear canal normal.     Nose: Nose normal.  Eyes:     General: No scleral icterus.    Conjunctiva/sclera: Conjunctivae normal.  Neck:     Thyroid: No thyromegaly.  Cardiovascular:     Rate and Rhythm:  Normal rate and regular rhythm.     Heart sounds: Normal heart sounds.  Pulmonary:     Effort: Pulmonary effort is normal.     Breath sounds: Normal breath sounds.  Abdominal:     General: Bowel sounds are normal. There is no distension.     Palpations: Abdomen is soft.     Tenderness: There is no abdominal tenderness. There is no guarding.  Musculoskeletal:        General: Normal range of motion.     Cervical back: Normal range of motion and neck supple.  Lymphadenopathy:     Cervical: No cervical adenopathy.  Skin:    General: Skin is warm and dry.     Findings: No rash.  Neurological:     General: No focal deficit present.     Mental Status: She is alert and oriented to person, place, and time.     Cranial Nerves: No cranial nerve deficit.     Coordination: Coordination normal.  Psychiatric:        Mood and Affect: Mood normal.        Behavior: Behavior normal.     ------------------------------------------------------------------------------------------------------------------------------------------------------------------------------------------------------------------- Assessment and Plan  Well adult exam Well adult Orders Placed This Encounter  Procedures   MM 3D SCREENING MAMMOGRAM BILATERAL BREAST    Standing Status:   Future    Standing Expiration Date:   10/27/2023    Order Specific Question:   Reason for Exam (SYMPTOM  OR DIAGNOSIS REQUIRED)    Answer:   Screen    Order Specific Question:   Preferred imaging location?    Answer:   [facility]   CMP14+EGFR   CBC with Differential   Lipid Panel With LDL/HDL Ratio   TSH   HgB U0A   Magnesium  Screenings: per lab orders Immunizations:  UTD Anticipatory guidance/Risk factor reduction:  Recommendations per AVS.   No orders of the defined types were placed in this encounter.   No follow-ups on file.    This visit occurred during the SARS-CoV-2 public health emergency.  Safety protocols were  in place, including screening questions prior to the visit, additional usage of staff PPE, and extensive cleaning of exam room while observing appropriate contact time as indicated for disinfecting solutions.

## 2022-10-27 NOTE — Patient Instructions (Signed)
Preventive Care 40-64 Years Old, Female Preventive care refers to lifestyle choices and visits with your health care provider that can promote health and wellness. Preventive care visits are also called wellness exams. What can I expect for my preventive care visit? Counseling Your health care provider may ask you questions about your: Medical history, including: Past medical problems. Family medical history. Pregnancy history. Current health, including: Menstrual cycle. Method of birth control. Emotional well-being. Home life and relationship well-being. Sexual activity and sexual health. Lifestyle, including: Alcohol, nicotine or tobacco, and drug use. Access to firearms. Diet, exercise, and sleep habits. Work and work environment. Sunscreen use. Safety issues such as seatbelt and bike helmet use. Physical exam Your health care provider will check your: Height and weight. These may be used to calculate your BMI (body mass index). BMI is a measurement that tells if you are at a healthy weight. Waist circumference. This measures the distance around your waistline. This measurement also tells if you are at a healthy weight and may help predict your risk of certain diseases, such as type 2 diabetes and high blood pressure. Heart rate and blood pressure. Body temperature. Skin for abnormal spots. What immunizations do I need?  Vaccines are usually given at various ages, according to a schedule. Your health care provider will recommend vaccines for you based on your age, medical history, and lifestyle or other factors, such as travel or where you work. What tests do I need? Screening Your health care provider may recommend screening tests for certain conditions. This may include: Lipid and cholesterol levels. Diabetes screening. This is done by checking your blood sugar (glucose) after you have not eaten for a while (fasting). Pelvic exam and Pap test. Hepatitis B test. Hepatitis C  test. HIV (human immunodeficiency virus) test. STI (sexually transmitted infection) testing, if you are at risk. Lung cancer screening. Colorectal cancer screening. Mammogram. Talk with your health care provider about when you should start having regular mammograms. This may depend on whether you have a family history of breast cancer. BRCA-related cancer screening. This may be done if you have a family history of breast, ovarian, tubal, or peritoneal cancers. Bone density scan. This is done to screen for osteoporosis. Talk with your health care provider about your test results, treatment options, and if necessary, the need for more tests. Follow these instructions at home: Eating and drinking  Eat a diet that includes fresh fruits and vegetables, whole grains, lean protein, and low-fat dairy products. Take vitamin and mineral supplements as recommended by your health care provider. Do not drink alcohol if: Your health care provider tells you not to drink. You are pregnant, may be pregnant, or are planning to become pregnant. If you drink alcohol: Limit how much you have to 0-1 drink a day. Know how much alcohol is in your drink. In the [place] one drink equals one 12 oz bottle of beer (355 mL), one 5 oz glass of wine (148 mL), or one 1 oz glass of hard liquor (44 mL). Lifestyle Brush your teeth every morning and night with fluoride toothpaste. Floss one time each day. Exercise for at least 30 minutes 5 or more days each week. Do not use any products that contain nicotine or tobacco. These products include cigarettes, chewing tobacco, and vaping devices, such as e-cigarettes. If you need help quitting, ask your health care provider. Do not use drugs. If you are sexually active, practice safe sex. Use a condom or other form of protection to   prevent STIs. If you do not wish to become pregnant, use a form of birth control. If you plan to become pregnant, see your health care provider for a  prepregnancy visit. Take aspirin only as told by your health care provider. Make sure that you understand how much to take and what form to take. Work with your health care provider to find out whether it is safe and beneficial for you to take aspirin daily. Find healthy ways to manage stress, such as: Meditation, yoga, or listening to music. Journaling. Talking to a trusted person. Spending time with friends and family. Minimize exposure to UV radiation to reduce your risk of skin cancer. Safety Always wear your seat belt while driving or riding in a vehicle. Do not drive: If you have been drinking alcohol. Do not ride with someone who has been drinking. When you are tired or distracted. While texting. If you have been using any mind-altering substances or drugs. Wear a helmet and other protective equipment during sports activities. If you have firearms in your house, make sure you follow all gun safety procedures. Seek help if you have been physically or sexually abused. What's next? Visit your health care provider once a year for an annual wellness visit. Ask your health care provider how often you should have your eyes and teeth checked. Stay up to date on all vaccines. This information is not intended to replace advice given to you by your health care provider. Make sure you discuss any questions you have with your health care provider. Document Revised: 09/11/2020 Document Reviewed: 09/11/2020 Elsevier Patient Education  2024 [employer] Inc.  

## 2022-10-27 NOTE — Assessment & Plan Note (Signed)
Well adult Orders Placed This Encounter  Procedures   MM 3D SCREENING MAMMOGRAM BILATERAL BREAST    Standing Status:   Future    Standing Expiration Date:   10/27/2023    Order Specific Question:   Reason for Exam (SYMPTOM  OR DIAGNOSIS REQUIRED)    Answer:   Screen    Order Specific Question:   Preferred imaging location?    Answer:   [facility]    CMP14+EGFR   CBC with Differential   Lipid Panel With LDL/HDL Ratio   TSH   HgB Q6V   Magnesium  Screenings: per lab orders Immunizations:  UTD Anticipatory guidance/Risk factor reduction:  Recommendations per AVS.

## 2022-11-04 ENCOUNTER — Ambulatory Visit: Payer: Commercial Managed Care - PPO

## 2022-11-05 ENCOUNTER — Ambulatory Visit: Payer: Commercial Managed Care - PPO

## 2022-11-05 DIAGNOSIS — Z0000 Encounter for general adult medical examination without abnormal findings: Secondary | ICD-10-CM | POA: Diagnosis not present

## 2022-11-05 DIAGNOSIS — Z1231 Encounter for screening mammogram for malignant neoplasm of breast: Secondary | ICD-10-CM

## 2022-11-05 DIAGNOSIS — R739 Hyperglycemia, unspecified: Secondary | ICD-10-CM | POA: Diagnosis not present

## 2022-11-05 DIAGNOSIS — E782 Mixed hyperlipidemia: Secondary | ICD-10-CM | POA: Diagnosis not present

## 2022-11-05 DIAGNOSIS — I10 Essential (primary) hypertension: Secondary | ICD-10-CM | POA: Diagnosis not present

## 2022-11-12 ENCOUNTER — Other Ambulatory Visit (HOSPITAL_BASED_OUTPATIENT_CLINIC_OR_DEPARTMENT_OTHER): Payer: Self-pay

## 2022-11-12 ENCOUNTER — Encounter: Payer: Self-pay | Admitting: Family Medicine

## 2022-11-12 ENCOUNTER — Ambulatory Visit: Payer: Commercial Managed Care - PPO | Admitting: Family Medicine

## 2022-11-12 VITALS — BP 134/82 | HR 92 | Resp 18 | Ht 64.0 in | Wt 196.4 lb

## 2022-11-12 DIAGNOSIS — L509 Urticaria, unspecified: Secondary | ICD-10-CM | POA: Diagnosis not present

## 2022-11-12 MED ORDER — CETIRIZINE HCL 10 MG PO TABS
10.0000 mg | ORAL_TABLET | Freq: Every day | ORAL | 0 refills | Status: DC
  Filled 2022-11-12: qty 100, 90d supply, fill #0

## 2022-11-12 MED ORDER — METHYLPREDNISOLONE SODIUM SUCC 125 MG IJ SOLR
125.0000 mg | Freq: Once | INTRAMUSCULAR | Status: AC
  Administered 2022-11-12: 125 mg via INTRAMUSCULAR

## 2022-11-12 MED ORDER — FAMOTIDINE 20 MG PO TABS
20.0000 mg | ORAL_TABLET | Freq: Every day | ORAL | 0 refills | Status: DC
  Filled 2022-11-12: qty 7, 7d supply, fill #0

## 2022-11-12 MED ORDER — HYDROXYZINE HCL 10 MG PO TABS
10.0000 mg | ORAL_TABLET | Freq: Every evening | ORAL | 0 refills | Status: DC | PRN
  Filled 2022-11-12: qty 30, 30d supply, fill #0

## 2022-11-12 MED ORDER — PREDNISONE 20 MG PO TABS
40.0000 mg | ORAL_TABLET | Freq: Every day | ORAL | 0 refills | Status: AC
  Filled 2022-11-12: qty 8, 4d supply, fill #0

## 2022-11-12 NOTE — Patient Instructions (Addendum)
Take prednisone starting tomorrow for 4 days Take zyrtec for 7 days  Take hydroxyzine at night as needed for itching

## 2022-11-12 NOTE — Addendum Note (Signed)
Addended byRoselyn Reef on: 11/12/2022 02:13 PM   Modules accepted: Orders

## 2022-11-12 NOTE — Progress Notes (Signed)
Acute Office Visit  Subjective:     Patient ID: Nicole Osborne, female    DOB: 1961-02-22, 62 y.o.   MRN: [id number]  Chief Complaint  Patient presents with   Rash    Pt first noticed last night when she got off work rash is all over body    Rash Pertinent negatives include no cough, fever or shortness of breath.   Patient is in today for urticarial rash.  Review of Systems  Constitutional:  Negative for chills and fever.  Respiratory:  Negative for cough and shortness of breath.   Cardiovascular:  Negative for chest pain.  Skin:  Positive for rash.  Neurological:  Negative for headaches.        Objective:    BP 134/82 (BP Location: Left Arm, Patient Position: Sitting, Cuff Size: Large)   Pulse 92   Resp 18   Ht 5\' 4"  (1.626 m)   Wt 196 lb 6 oz (89.1 kg)   SpO2 95%   BMI 33.71 kg/m    Physical Exam Vitals and nursing note reviewed.  Constitutional:      General: She is not in acute distress.    Appearance: Normal appearance.  HENT:     Head: Normocephalic and atraumatic.     Right Ear: External ear normal.     Left Ear: External ear normal.     Nose: Nose normal.  Eyes:     Conjunctiva/sclera: Conjunctivae normal.  Cardiovascular:     Rate and Rhythm: Normal rate.  Pulmonary:     Effort: Pulmonary effort is normal.  Skin:    Comments: Diffuse urticarial rash widespread across entire body   Neurological:     General: No focal deficit present.     Mental Status: She is alert and oriented to person, place, and time.  Psychiatric:        Mood and Affect: Mood normal.        Behavior: Behavior normal.        Thought Content: Thought content normal.        Judgment: Judgment normal.     No results found for any visits on 11/12/22.      Assessment & Plan:   Problem List Items Addressed This Visit       Musculoskeletal and Integument   Urticarial rash - Primary    Very pleasant 62 year old female presents today with widespread urticarial  rash.  She has not used any new detergents, does not notice any new bedding or close.  Has not tried any new foods or been anywhere different.  On exam patient is uncomfortable with the intense amount of itching.  Given Solu-Medrol 125 in clinic today.  She will then go ahead and start prednisone 40 mg tomorrow and continue for 4 days.  Have also added hydroxyzine as needed at night in addition to Zyrtec daily.  Pepcid was also added to regimen to help decrease eosinophilic response      Relevant Medications   predniSONE (DELTASONE) 20 MG tablet    Meds ordered this encounter  Medications   predniSONE (DELTASONE) 20 MG tablet    Sig: Take 2 tablets (40 mg total) by mouth daily with breakfast for 4 days.    Dispense:  8 tablet    Refill:  0   cetirizine (ZYRTEC) 10 MG tablet    Sig: Take 1 tablet (10 mg total) by mouth daily.    Dispense:  7 tablet    Refill:  0  hydrOXYzine (ATARAX) 10 MG tablet    Sig: Take 1 tablet (10 mg total) by mouth at bedtime as needed.    Dispense:  30 tablet    Refill:  0   famotidine (PEPCID) 20 MG tablet    Sig: Take 1 tablet (20 mg total) by mouth daily for 7 days.    Dispense:  7 tablet    Refill:  0    Return in about 1 week (around 11/19/2022) for with pcp.  Charlton Amor, DO

## 2022-11-12 NOTE — Assessment & Plan Note (Signed)
Very pleasant 62 year old female presents today with widespread urticarial rash.  She has not used any new detergents, does not notice any new bedding or close.  Has not tried any new foods or been anywhere different.  On exam patient is uncomfortable with the intense amount of itching.  Given Solu-Medrol 125 in clinic today.  She will then go ahead and start prednisone 40 mg tomorrow and continue for 4 days.  Have also added hydroxyzine as needed at night in addition to Zyrtec daily.  Pepcid was also added to regimen to help decrease eosinophilic response

## 2022-11-19 ENCOUNTER — Ambulatory Visit: Payer: Commercial Managed Care - PPO | Admitting: Family Medicine

## 2022-11-19 ENCOUNTER — Encounter: Payer: Self-pay | Admitting: Family Medicine

## 2022-11-19 VITALS — BP 146/79 | HR 97 | Ht 64.0 in | Wt 193.0 lb

## 2022-11-19 DIAGNOSIS — L509 Urticaria, unspecified: Secondary | ICD-10-CM

## 2022-11-19 NOTE — Assessment & Plan Note (Addendum)
Rash has essentially resolved at this point.  She may continue cetirzine as needed.  Unclear what cause of her rash was.

## 2022-11-19 NOTE — Progress Notes (Signed)
Nicole Osborne - 62 y.o. female MRN [id number]  Date of birth: 01-06-1961  Subjective Chief Complaint  Patient presents with   Medical Management of Chronic Issues    1 wek fup on rash    HPI Nicole Osborne is a 62 y.o. female here today for follow up of rash.  Seen by Dr. Tamera Punt last week for pruritic rash.  Given solu-medorl in clinice with additions prednisone burst.  Vistaril added as needed  She reports that she is doing much better.  Hasn't really figured out what may have triggered the rash.    ROS:  A comprehensive ROS was completed and negative except as noted per HPI  No Known Allergies  Past Medical History:  Diagnosis Date   Anemia    Colon polyp    Depression    Diverticulosis    Gallbladder sludge    Hypertension    Hypertriglyceridemia     Past Surgical History:  Procedure Laterality Date   BREAST BIOPSY Right    BREAST EXCISIONAL BIOPSY Right 2012   CESAREAN SECTION     x 3   DILATION AND CURETTAGE OF UTERUS     TUBAL LIGATION      Social History   Socioeconomic History   Marital status: Married    Spouse name: Not on file   Number of children: 3   Years of education: Not on file   Highest education level: Not on file  Occupational History   Occupation: RN  Tobacco Use   Smoking status: Never   Smokeless tobacco: Never  Vaping Use   Vaping status: Never Used  Substance and Sexual Activity   Alcohol use: Yes    Comment: occasional   Drug use: No   Sexual activity: Not on file  Other Topics Concern   Not on file  Social History Narrative   Not on file   Social Determinants of Health   Financial Resource Strain: Not on file  Food Insecurity: Not on file  Transportation Needs: Not on file  Physical Activity: Not on file  Stress: Not on file  Social Connections: Not on file    Family History  Problem Relation Age of Onset   Hypertension Mother    Diabetes Mother    Lung cancer Father    Breast cancer Sister 6    Leukemia Sister    Lung cancer Sister    Colon cancer Maternal Grandmother    CAD Neg Hx     Health Maintenance  Topic Date Due   COVID-19 Vaccine (3 - 2023-24 season) 03/30/2023 (Originally 11/28/2021)   INFLUENZA VACCINE  06/28/2023 (Originally 10/29/2022)   MAMMOGRAM  11/05/2023   Colonoscopy  02/25/2026   PAP SMEAR-Modifier  02/18/2027   DTaP/Tdap/Td (2 - Td or Tdap) 08/15/2031   Hepatitis C Screening  Completed   HIV Screening  Completed   Zoster Vaccines- Shingrix  Completed   HPV VACCINES  Aged Out     ----------------------------------------------------------------------------------------------------------------------------------------------------------------------------------------------------------------- Physical Exam BP (!) 146/79   Pulse 97   Ht 5\' 4"  (1.626 m)   Wt 193 lb (87.5 kg)   SpO2 99%   BMI 33.13 kg/m   Physical Exam Constitutional:      Appearance: Normal appearance.  Eyes:     General: No scleral icterus. Cardiovascular:     Rate and Rhythm: Normal rate and regular rhythm.  Pulmonary:     Effort: Pulmonary effort is normal.     Breath sounds: Normal breath sounds.  Neurological:  Mental Status: She is alert.  Psychiatric:        Mood and Affect: Mood normal.        Behavior: Behavior normal.     ------------------------------------------------------------------------------------------------------------------------------------------------------------------------------------------------------------------- Assessment and Plan  Urticarial rash Rash has essentially resolved at this point.  She may continue cetirzine as needed.  Unclear what cause of her rash was.     No orders of the defined types were placed in this encounter.   No follow-ups on file.    This visit occurred during the SARS-CoV-2 public health emergency.  Safety protocols were in place, including screening questions prior to the visit, additional usage of staff PPE, and  extensive cleaning of exam room while observing appropriate contact time as indicated for disinfecting solutions.

## 2023-04-06 ENCOUNTER — Encounter: Payer: Self-pay | Admitting: Family Medicine

## 2023-05-06 ENCOUNTER — Ambulatory Visit: Payer: Commercial Managed Care - PPO | Admitting: Family Medicine

## 2023-05-07 NOTE — Progress Notes (Addendum)
 [facility] [address] [place] [address] Phone: [phone] Subjective:   [name], am serving as a scribe for Dr. Antoine Primas.  I'm seeing this patient by the request  of:  Everrett Coombe, DO  CC: right knee psin   [id number]  Nicole Osborne is a 63 y.o. female coming in with complaint of R knee pain, patient has been seen by other providers over the years for this problem.  Patient was given an injection in 2023 and seemed to do significantly better for quite some time.  Patient states tore mensicus in 2015 got a steroid then and steroid 2 years ago. Worked for a while, but has gotten progressively worse. No real home therapy. Thinks hips may be off because of the way she is walking. Most pain when attempting to flex. ROM has decreased.       Past Medical History:  Diagnosis Date   Anemia    Colon polyp    Depression    Diverticulosis    Gallbladder sludge    Hypertension    Hypertriglyceridemia    Past Surgical History:  Procedure Laterality Date   BREAST BIOPSY Right    BREAST EXCISIONAL BIOPSY Right 2012   CESAREAN SECTION     x 3   DILATION AND CURETTAGE OF UTERUS     TUBAL LIGATION     Social History   Socioeconomic History   Marital status: Married    Spouse name: Not on file   Number of children: 3   Years of education: Not on file   Highest education level: Not on file  Occupational History   Occupation: RN  Tobacco Use   Smoking status: Never   Smokeless tobacco: Never  Vaping Use   Vaping status: Never Used  Substance and Sexual Activity   Alcohol use: Yes    Comment: occasional   Drug use: No   Sexual activity: Not on file  Other Topics Concern   Not on file  Social History Narrative   Not on file   Social Drivers of Health   Financial Resource Strain: Not on file  Food Insecurity: Not on file  Transportation Needs: Not on file  Physical Activity: Not on file  Stress: Not on file   Social Connections: Not on file   No Known Allergies Family History  Problem Relation Age of Onset   Hypertension Mother    Diabetes Mother    Lung cancer Father    Breast cancer Sister 71   Leukemia Sister    Lung cancer Sister    Colon cancer Maternal Grandmother    CAD Neg Hx      Current Outpatient Medications (Cardiovascular):    atorvastatin (LIPITOR) 20 MG tablet, Take 1 tablet (20 mg total) by mouth daily.   lisinopril-hydrochlorothiazide (ZESTORETIC) 20-25 MG tablet, Take 1/2 tablet by mouth daily.  Current Outpatient Medications (Respiratory):    cetirizine (ZYRTEC) 10 MG tablet, Take 1 tablet (10 mg total) by mouth daily.  Current Outpatient Medications (Analgesics):    meloxicam (MOBIC) 15 MG tablet, Take 1 tablet (15 mg total) by mouth daily.   Current Outpatient Medications (Other):    famotidine (PEPCID) 20 MG tablet, Take 1 tablet (20 mg total) by mouth daily for 7 days.   hydrOXYzine (ATARAX) 10 MG tablet, Take 1 tablet (10 mg total) by mouth at bedtime as needed.   Reviewed prior external information including notes and imaging from  primary care  provider As well as notes that were available from care everywhere and other healthcare systems.  Past medical history, social, surgical and family history all reviewed in electronic medical record.  No pertanent information unless stated regarding to the chief complaint.   Review of Systems:  No headache, visual changes, nausea, vomiting, diarrhea, constipation, dizziness, abdominal pain, skin rash, fevers, chills, night sweats, weight loss, swollen lymph nodes, body aches, joint swelling, chest pain, shortness of breath, mood changes. POSITIVE muscle aches  Objective  Blood pressure 110/70, pulse 91, height 5\' 4"  (1.626 m), weight 195 lb (88.5 kg), SpO2 98%.   General: No apparent distress alert and oriented x3 mood and affect normal, dressed appropriately.  HEENT: Pupils equal, extraocular movements intact   Respiratory: Patient's speak in full sentences and does not appear short of breath  Cardiovascular: No lower extremity edema, non tender, no erythema  Knee exam shows swelling noted of the patellofemoral joint.  Tender to palpation as well.  Crepitus noted.  Limited muscular skeletal ultrasound was performed and interpreted by Antoine Primas, M   Patient does have a synovitis noted of the patellofemoral joint.  Hypoechoic changes noted as well.  No significant abnormality of the medial or lateral meniscus noted.  More narrowing in the patellofemoral area. Impression: Patellofemoral arthritis with synovitis  After informed written and verbal consent, patient was seated on exam table. Right knee was prepped with alcohol swab and utilizing anterolateral approach, patient's right knee space was injected with 4:1  marcaine 0.5%: Kenalog 40mg /dL. Patient tolerated the procedure well without immediate complications.   Impression and Recommendations:    The above documentation has been reviewed and is accurate and complete Judi Saa, DO

## 2023-05-10 ENCOUNTER — Other Ambulatory Visit: Payer: Self-pay | Admitting: Family Medicine

## 2023-05-10 ENCOUNTER — Encounter: Payer: Self-pay | Admitting: Family Medicine

## 2023-05-10 ENCOUNTER — Other Ambulatory Visit: Payer: Self-pay

## 2023-05-10 ENCOUNTER — Other Ambulatory Visit (HOSPITAL_BASED_OUTPATIENT_CLINIC_OR_DEPARTMENT_OTHER): Payer: Self-pay

## 2023-05-10 ENCOUNTER — Ambulatory Visit: Payer: Commercial Managed Care - PPO | Admitting: Family Medicine

## 2023-05-10 VITALS — BP 110/70 | HR 91 | Ht 64.0 in | Wt 195.0 lb

## 2023-05-10 DIAGNOSIS — G8929 Other chronic pain: Secondary | ICD-10-CM

## 2023-05-10 DIAGNOSIS — M65961 Unspecified synovitis and tenosynovitis, right lower leg: Secondary | ICD-10-CM | POA: Diagnosis not present

## 2023-05-10 DIAGNOSIS — M25561 Pain in right knee: Secondary | ICD-10-CM | POA: Diagnosis not present

## 2023-05-10 DIAGNOSIS — M2550 Pain in unspecified joint: Secondary | ICD-10-CM

## 2023-05-10 LAB — COMPREHENSIVE METABOLIC PANEL
ALT: 31 U/L (ref 0–35)
AST: 37 U/L (ref 0–37)
Albumin: 4.6 g/dL (ref 3.5–5.2)
Alkaline Phosphatase: 94 U/L (ref 39–117)
BUN: 11 mg/dL (ref 6–23)
CO2: 30 meq/L (ref 19–32)
Calcium: 10.2 mg/dL (ref 8.4–10.5)
Chloride: 97 meq/L (ref 96–112)
Creatinine, Ser: 0.57 mg/dL (ref 0.40–1.20)
GFR: 96.73 mL/min (ref >60.00)
Glucose, Bld: 94 mg/dL (ref 70–99)
Potassium: 3.5 meq/L (ref 3.5–5.1)
Sodium: 137 meq/L (ref 135–145)
Total Bilirubin: 1.2 mg/dL (ref 0.2–1.2)
Total Protein: 7.5 g/dL (ref 6.0–8.3)

## 2023-05-10 LAB — CBC WITH DIFFERENTIAL/PLATELET
Basophils Absolute: 0 10*3/uL (ref 0.0–0.1)
Basophils Relative: 0.3 % (ref 0.0–3.0)
Eosinophils Absolute: 0.2 10*3/uL (ref 0.0–0.7)
Eosinophils Relative: 1.8 % (ref 0.0–5.0)
HCT: 44.3 % (ref 36.0–46.0)
Hemoglobin: 14.8 g/dL (ref 12.0–15.0)
Lymphocytes Relative: 30.2 % (ref 12.0–46.0)
Lymphs Abs: 3.1 10*3/uL (ref 0.7–4.0)
MCHC: 33.4 g/dL (ref 30.0–36.0)
MCV: 85 fL (ref 78.0–100.0)
Monocytes Absolute: 0.8 10*3/uL (ref 0.1–1.0)
Monocytes Relative: 7.3 % (ref 3.0–12.0)
Neutro Abs: 6.3 10*3/uL (ref 1.4–7.7)
Neutrophils Relative %: 60.4 % (ref 43.0–77.0)
Platelets: 266 10*3/uL (ref 150.0–400.0)
RBC: 5.21 Mil/uL — ABNORMAL HIGH (ref 3.87–5.11)
RDW: 12.8 % (ref 11.5–15.5)
WBC: 10.4 10*3/uL (ref 4.0–10.5)

## 2023-05-10 LAB — URIC ACID: Uric Acid, Serum: 6.5 mg/dL (ref 2.4–7.0)

## 2023-05-10 LAB — SEDIMENTATION RATE: Sed Rate: 24 mm/h (ref 0–30)

## 2023-05-10 LAB — IBC PANEL
Iron: 52 ug/dL (ref 42–145)
Saturation Ratios: 15 % — ABNORMAL LOW (ref 20.0–50.0)
TIBC: 347.2 ug/dL (ref 250.0–450.0)
Transferrin: 248 mg/dL (ref 212.0–360.0)

## 2023-05-10 LAB — C-REACTIVE PROTEIN: CRP: <1 mg/dL (ref 0.5–20.0)

## 2023-05-10 LAB — TSH: TSH: 0.9 u[IU]/mL (ref 0.35–5.50)

## 2023-05-10 MED ORDER — MELOXICAM 15 MG PO TABS
15.0000 mg | ORAL_TABLET | Freq: Every day | ORAL | 0 refills | Status: DC
  Filled 2023-05-10: qty 30, 30d supply, fill #0

## 2023-05-10 NOTE — Assessment & Plan Note (Signed)
 Injection given today, do see that patient does have likely patellofemoral arthritis.  X-rays ordered today to further evaluate for any progression from previous x-rays.  Discussed with patient about different strengthening options.  Patient though having a synovitis will get laboratory workup as well to make sure that there is no

## 2023-05-10 NOTE — Patient Instructions (Signed)
 Injection in knee Labs today See you again in 6-8 weeks

## 2023-05-11 ENCOUNTER — Encounter: Payer: Self-pay | Admitting: Family Medicine

## 2023-05-11 LAB — VITAMIN B12: Vitamin B-12: 274 pg/mL (ref 211–911)

## 2023-05-11 LAB — VITAMIN D 25 HYDROXY (VIT D DEFICIENCY, FRACTURES): VITD: 28.83 ng/mL — ABNORMAL LOW (ref 30.00–100.00)

## 2023-05-11 LAB — FERRITIN: Ferritin: 103.5 ng/mL (ref 10.0–291.0)

## 2023-05-12 ENCOUNTER — Other Ambulatory Visit (HOSPITAL_BASED_OUTPATIENT_CLINIC_OR_DEPARTMENT_OTHER): Payer: Self-pay

## 2023-05-12 LAB — PTH, INTACT AND CALCIUM
Calcium: 10.6 mg/dL — ABNORMAL HIGH (ref 8.6–10.4)
PTH: 23 pg/mL (ref 16–77)

## 2023-05-12 LAB — CALCIUM, IONIZED: Calcium, Ion: 5.6 mg/dL — ABNORMAL HIGH (ref 4.7–5.5)

## 2023-05-12 LAB — RHEUMATOID FACTOR: Rheumatoid fact SerPl-aCnc: 10 [IU]/mL (ref <14)

## 2023-05-12 LAB — ANTI-NUCLEAR AB-TITER (ANA TITER): ANA Titer 1: 1:320 {titer} — ABNORMAL HIGH

## 2023-05-12 LAB — CYCLIC CITRUL PEPTIDE ANTIBODY, IGG: Cyclic Citrullin Peptide Ab: <16 U

## 2023-05-12 LAB — ANGIOTENSIN CONVERTING ENZYME: Angiotensin-Converting Enzyme: <5 U/L — ABNORMAL LOW (ref 9–67)

## 2023-05-12 LAB — ANA: Anti Nuclear Antibody (ANA): POSITIVE — AB

## 2023-05-12 MED ORDER — ATORVASTATIN CALCIUM 20 MG PO TABS
20.0000 mg | ORAL_TABLET | Freq: Every day | ORAL | 3 refills | Status: DC
  Filled 2023-05-12 (×2): qty 90, 90d supply, fill #0

## 2023-06-17 NOTE — Progress Notes (Signed)
 [facility] [address] [place] [address] Phone: [phone] Subjective:   [name], am serving as a scribe for Dr. Antoine Primas.  I'm seeing this patient by the request  of:  Everrett Coombe, DO  CC: right knee pain   [id number]  05/10/2023 Injection given today, do see that patient does have likely patellofemoral arthritis.  X-rays ordered today to further evaluate for any progression from previous x-rays.  Discussed with patient about different strengthening options.  Patient though having a synovitis will get laboratory workup as well to make sure that there is no      Update 06/21/2023 Nicole Osborne is a 63 y.o. female coming in with complaint of R knee pain. Patient states did well on cruise. Still doing well today. Feeling good.  Patient's laboratory workup did show B12 deficiency noted as well as vitamin D deficiency.      Past Medical History:  Diagnosis Date   Anemia    Colon polyp    Depression    Diverticulosis    Gallbladder sludge    Hypertension    Hypertriglyceridemia    Past Surgical History:  Procedure Laterality Date   BREAST BIOPSY Right    BREAST EXCISIONAL BIOPSY Right 2012   CESAREAN SECTION     x 3   DILATION AND CURETTAGE OF UTERUS     TUBAL LIGATION     Social History   Socioeconomic History   Marital status: Married    Spouse name: Not on file   Number of children: 3   Years of education: Not on file   Highest education level: Not on file  Occupational History   Occupation: RN  Tobacco Use   Smoking status: Never   Smokeless tobacco: Never  Vaping Use   Vaping status: Never Used  Substance and Sexual Activity   Alcohol use: Yes    Comment: occasional   Drug use: No   Sexual activity: Not on file  Other Topics Concern   Not on file  Social History Narrative   Not on file   Social Drivers of Health   Financial Resource Strain: Not on file  Food Insecurity: Not on file   Transportation Needs: Not on file  Physical Activity: Not on file  Stress: Not on file  Social Connections: Not on file   No Known Allergies Family History  Problem Relation Age of Onset   Hypertension Mother    Diabetes Mother    Lung cancer Father    Breast cancer Sister 52   Leukemia Sister    Lung cancer Sister    Colon cancer Maternal Grandmother    CAD Neg Hx      Current Outpatient Medications (Cardiovascular):    atorvastatin (LIPITOR) 20 MG tablet, Take 1 tablet (20 mg total) by mouth daily.   lisinopril-hydrochlorothiazide (ZESTORETIC) 20-25 MG tablet, Take 1/2 tablet by mouth daily.  Current Outpatient Medications (Respiratory):    cetirizine (ZYRTEC) 10 MG tablet, Take 1 tablet (10 mg total) by mouth daily.  Current Outpatient Medications (Analgesics):    meloxicam (MOBIC) 15 MG tablet, Take 1 tablet (15 mg total) by mouth daily.   Current Outpatient Medications (Other):    famotidine (PEPCID) 20 MG tablet, Take 1 tablet (20 mg total) by mouth daily for 7 days.   hydrOXYzine (ATARAX) 10 MG tablet, Take 1 tablet (10 mg total) by mouth at bedtime as needed.   Reviewed prior external information including notes and  imaging from  primary care provider As well as notes that were available from care everywhere and other healthcare systems.  Past medical history, social, surgical and family history all reviewed in electronic medical record.  No pertanent information unless stated regarding to the chief complaint.   Review of Systems:  No headache, visual changes, nausea, vomiting, diarrhea, constipation, dizziness, abdominal pain, skin rash, fevers, chills, night sweats, weight loss, swollen lymph nodes, body aches, joint swelling, chest pain, shortness of breath, mood changes. POSITIVE muscle aches  Objective  Blood pressure 112/82, pulse 95, height 5\' 4"  (1.626 m), SpO2 96%.   General: No apparent distress alert and oriented x3 mood and affect normal, dressed  appropriately.  HEENT: Pupils equal, extraocular movements intact  Respiratory: Patient's speak in full sentences and does not appear short of breath  Cardiovascular: No lower extremity edema, non tender, no erythema  Right knee exam shows patient does have some crepitus noted.  Some tenderness to palpation minorly but nothing severe.  Does seem to be improved from previous exam. No significant instability noted with valgus or varus force.    Impression and Recommendations:    The above documentation has been reviewed and is accurate and complete Judi Saa, DO

## 2023-06-21 ENCOUNTER — Encounter: Payer: Self-pay | Admitting: Family Medicine

## 2023-06-21 ENCOUNTER — Ambulatory Visit: Payer: Commercial Managed Care - PPO | Admitting: Family Medicine

## 2023-06-21 VITALS — BP 112/82 | HR 95 | Ht 64.0 in

## 2023-06-21 DIAGNOSIS — E559 Vitamin D deficiency, unspecified: Secondary | ICD-10-CM | POA: Diagnosis not present

## 2023-06-21 DIAGNOSIS — E538 Deficiency of other specified B group vitamins: Secondary | ICD-10-CM | POA: Diagnosis not present

## 2023-06-21 DIAGNOSIS — R7989 Other specified abnormal findings of blood chemistry: Secondary | ICD-10-CM | POA: Insufficient documentation

## 2023-06-21 DIAGNOSIS — M2550 Pain in unspecified joint: Secondary | ICD-10-CM | POA: Diagnosis not present

## 2023-06-21 DIAGNOSIS — M65961 Unspecified synovitis and tenosynovitis, right lower leg: Secondary | ICD-10-CM | POA: Diagnosis not present

## 2023-06-21 MED ORDER — CYANOCOBALAMIN 1000 MCG/ML IJ SOLN
1000.0000 ug | Freq: Once | INTRAMUSCULAR | Status: AC
  Administered 2023-06-21: 1000 ug via INTRAMUSCULAR

## 2023-06-21 NOTE — Patient Instructions (Signed)
 Continue Vit D Knee looks great  B12 injection today See you again in 3 months we can draw labs then

## 2023-06-21 NOTE — Assessment & Plan Note (Signed)
 Continue supplementtions

## 2023-06-21 NOTE — Assessment & Plan Note (Signed)
 B12 injection given today to try to help increase.  Patient will take oral supplementation for the next 2 to 3 months and then we will recheck values to make sure patient is absorbing it.  If not we will need to switch to IM injections.

## 2023-06-21 NOTE — Assessment & Plan Note (Signed)
 Significant improvement at this time.  Workup did show a positive ANA and we will monitor without rechecking likely within 1 year.  Do not think it would change medical management at the moment.  Follow-up with me for this knee in 2 to 3 months.  Could be candidate for possible viscosupplementation if needed.  Hopefully patient will do well otherwise.

## 2023-07-18 ENCOUNTER — Other Ambulatory Visit: Payer: Self-pay | Admitting: Family Medicine

## 2023-07-18 DIAGNOSIS — I10 Essential (primary) hypertension: Secondary | ICD-10-CM

## 2023-07-19 ENCOUNTER — Other Ambulatory Visit (HOSPITAL_BASED_OUTPATIENT_CLINIC_OR_DEPARTMENT_OTHER): Payer: Self-pay

## 2023-07-19 MED ORDER — LISINOPRIL-HYDROCHLOROTHIAZIDE 20-25 MG PO TABS
0.5000 | ORAL_TABLET | Freq: Every day | ORAL | 0 refills | Status: DC
  Filled 2023-07-19: qty 15, 30d supply, fill #0

## 2023-07-19 NOTE — Telephone Encounter (Signed)
 Pls contact the pt to schedule HTN appt with Dr. Augustus Ledger. Sending 30 day med refill. Thx.

## 2023-08-02 ENCOUNTER — Other Ambulatory Visit: Payer: Self-pay | Admitting: Family Medicine

## 2023-08-03 ENCOUNTER — Other Ambulatory Visit (HOSPITAL_BASED_OUTPATIENT_CLINIC_OR_DEPARTMENT_OTHER): Payer: Self-pay

## 2023-08-03 MED ORDER — MELOXICAM 15 MG PO TABS
15.0000 mg | ORAL_TABLET | Freq: Every day | ORAL | 0 refills | Status: AC
  Filled 2023-08-03: qty 30, 30d supply, fill #0

## 2023-08-19 ENCOUNTER — Other Ambulatory Visit (HOSPITAL_COMMUNITY): Payer: Self-pay

## 2023-08-19 ENCOUNTER — Ambulatory Visit: Admitting: Family Medicine

## 2023-08-19 ENCOUNTER — Encounter: Payer: Self-pay | Admitting: Family Medicine

## 2023-08-19 VITALS — BP 134/75 | HR 72 | Ht 64.0 in | Wt 203.0 lb

## 2023-08-19 DIAGNOSIS — M545 Low back pain, unspecified: Secondary | ICD-10-CM | POA: Insufficient documentation

## 2023-08-19 DIAGNOSIS — E782 Mixed hyperlipidemia: Secondary | ICD-10-CM | POA: Diagnosis not present

## 2023-08-19 DIAGNOSIS — I10 Essential (primary) hypertension: Secondary | ICD-10-CM | POA: Diagnosis not present

## 2023-08-19 MED ORDER — LISINOPRIL-HYDROCHLOROTHIAZIDE 20-25 MG PO TABS
0.5000 | ORAL_TABLET | Freq: Every day | ORAL | 1 refills | Status: AC
  Filled 2023-08-19: qty 45, 90d supply, fill #0
  Filled 2024-02-11: qty 45, 90d supply, fill #1
  Filled 2024-03-31: qty 45, 90d supply, fill #0

## 2023-08-19 MED ORDER — ATORVASTATIN CALCIUM 20 MG PO TABS
20.0000 mg | ORAL_TABLET | Freq: Every day | ORAL | 3 refills | Status: AC
  Filled 2023-08-19: qty 90, 90d supply, fill #0
  Filled 2024-03-30: qty 90, 90d supply, fill #1
  Filled 2024-03-31: qty 90, 90d supply, fill #0

## 2023-08-19 NOTE — Progress Notes (Signed)
 EDITHA BRIDGEFORTH - 63 y.o. female MRN [id number]  Date of birth: 11-24-1960  Subjective Chief Complaint  Patient presents with   Hypertension    HPI SHAWNIA VIZCARRONDO is a 63 y.o. female here today for follow up visit.   She reports that she is doing pretty well.  Seen by sports med and had knee injection which seems have helped.  She has also started supplementation with B vitamin and vitamin d .   She does not some throbbing in her leg at times when laying down.  She does have some lower back pain.    BP remains well controlled with lisinopril /hydrochlorothiazide .  She is tolerating this well at current strength.  She has not had chest pain, shortness of breath, palpitations, headache or vision changes.   Tolerating atorvastatin  at current strength.   ROS:  A comprehensive ROS was completed and negative except as noted per HPI   No Known Allergies  Past Medical History:  Diagnosis Date   Anemia    Colon polyp    Depression    Diverticulosis    Gallbladder sludge    Hypertension    Hypertriglyceridemia     Past Surgical History:  Procedure Laterality Date   BREAST BIOPSY Right    BREAST EXCISIONAL BIOPSY Right 2012   CESAREAN SECTION     x 3   DILATION AND CURETTAGE OF UTERUS     TUBAL LIGATION      Social History   Socioeconomic History   Marital status: Married    Spouse name: Not on file   Number of children: 3   Years of education: Not on file   Highest education level: Master's degree (e.g., MA, MS, MEng, MEd, MSW, MBA)  Occupational History   Occupation: RN  Tobacco Use   Smoking status: Never   Smokeless tobacco: Never  Vaping Use   Vaping status: Never Used  Substance and Sexual Activity   Alcohol use: Yes    Comment: occasional   Drug use: No   Sexual activity: Not on file  Other Topics Concern   Not on file  Social History Narrative   Not on file   Social Drivers of Health   Financial Resource Strain: Low Risk  (08/17/2023)    Overall Financial Resource Strain (CARDIA)    Difficulty of Paying Living Expenses: Not hard at all  Food Insecurity: No Food Insecurity (08/17/2023)   Hunger Vital Sign    Worried About Running Out of Food in the Last Year: Never true    Ran Out of Food in the Last Year: Never true  Transportation Needs: No Transportation Needs (08/17/2023)   PRAPARE - Administrator, Civil Service (Medical): No    Lack of Transportation (Non-Medical): No  Physical Activity: Insufficiently Active (08/17/2023)   Exercise Vital Sign    Days of Exercise per Week: 1 day    Minutes of Exercise per Session: 30 min  Stress: No Stress Concern Present (08/17/2023)   [employer] of Occupational Health - Occupational Stress Questionnaire    Feeling of Stress : Only a little  Social Connections: Socially Integrated (08/17/2023)   Social Connection and Isolation Panel [NHANES]    Frequency of Communication with Friends and Family: More than three times a week    Frequency of Social Gatherings with Friends and Family: Twice a week    Attends Religious Services: More than 4 times per year    Active Member of [employer] or Organizations: Yes  Attends Banker Meetings: More than 4 times per year    Marital Status: Married    Family History  Problem Relation Age of Onset   Hypertension Mother    Diabetes Mother    Lung cancer Father    Breast cancer Sister 72   Leukemia Sister    Lung cancer Sister    Colon cancer Maternal Grandmother    CAD Neg Hx     Health Maintenance  Topic Date Due   COVID-19 Vaccine (3 - 2024-25 season) 11/29/2022   INFLUENZA VACCINE  10/29/2023   MAMMOGRAM  11/05/2023   Colonoscopy  02/25/2026   Cervical Cancer Screening (HPV/Pap Cotest)  02/18/2027   DTaP/Tdap/Td (2 - Td or Tdap) 08/15/2031   Hepatitis C Screening  Completed   HIV Screening  Completed   Zoster Vaccines- Shingrix   Completed   HPV VACCINES  Aged Out   Meningococcal B Vaccine  Aged Out      ----------------------------------------------------------------------------------------------------------------------------------------------------------------------------------------------------------------- Physical Exam BP 134/75 (BP Location: Left Arm, Patient Position: Sitting, Cuff Size: Normal)   Pulse 72   Ht 5\' 4"  (1.626 m)   Wt 203 lb (92.1 kg)   SpO2 96%   BMI 34.84 kg/m   Physical Exam Constitutional:      Appearance: Normal appearance.  Eyes:     General: No scleral icterus. Cardiovascular:     Rate and Rhythm: Normal rate and regular rhythm.  Pulmonary:     Effort: Pulmonary effort is normal.     Breath sounds: Normal breath sounds.  Neurological:     Mental Status: She is alert.  Psychiatric:        Mood and Affect: Mood normal.        Behavior: Behavior normal.     ------------------------------------------------------------------------------------------------------------------------------------------------------------------------------------------------------------------- Assessment and Plan  Hyperlipidemia Doing well with atorvastatin  at current strength.  Will plan to continue.   Hypertension BP is well controlled.  Continue current medication.  Follow up in 6 months.   Low back pain She does have some intermittent low back pain.  Radiation to R leg when laying down sounds more radicular in nature.  She declines imaging.  Will follow up with Dr. Felipe Horton.  Has meloxicam  to use prn.    Meds ordered this encounter  Medications   lisinopril -hydrochlorothiazide  (ZESTORETIC ) 20-25 MG tablet    Sig: Take 0.5 (one half) tablet by mouth daily.    Dispense:  90 tablet    Refill:  1   atorvastatin  (LIPITOR) 20 MG tablet    Sig: Take 1 tablet (20 mg total) by mouth daily.    Dispense:  90 tablet    Refill:  3    Return in about 3 months (around 11/19/2023) for Annual Exam.

## 2023-08-19 NOTE — Assessment & Plan Note (Signed)
 She does have some intermittent low back pain.  Radiation to R leg when laying down sounds more radicular in nature.  She declines imaging.  Will follow up with Dr. Felipe Horton.  Has meloxicam  to use prn.

## 2023-08-19 NOTE — Assessment & Plan Note (Signed)
BP is well controlled.  Continue current medication.  Follow up in 6 months.

## 2023-08-19 NOTE — Assessment & Plan Note (Signed)
 Doing well with atorvastatin  at current strength.  Will plan to continue.

## 2023-08-25 ENCOUNTER — Other Ambulatory Visit (HOSPITAL_COMMUNITY): Payer: Self-pay

## 2023-09-15 NOTE — Progress Notes (Signed)
 [facility] [address] [place] [address] Phone: [phone] Subjective:   IBryan Osborne, am serving as a scribe for Dr. Ronnell Coins.  I'm seeing this patient by the request  of:  Adela Holter, DO  CC: Right knee pain, B12 deficiency follow-up  [id number]  06/21/2023 B12 injection given today to try to help increase.  Patient will take oral supplementation for the next 2 to 3 months and then we will recheck values to make sure patient is absorbing it.  If not we will need to switch to IM injections.     Continue supplementtions     Significant improvement at this time.  Workup did show a positive ANA and we will monitor without rechecking likely within 1 year.  Do not think it would change medical management at the moment.  Follow-up with me for this knee in 2 to 3 months.  Could be candidate for possible viscosupplementation if needed.  Hopefully patient will do well otherwise.      Update 09/16/2023 Nicole Osborne is a 63 y.o. female coming in with complaint of R knee pain.  Found to have arthritis and synovitis.  Patient states doing well. Knee is okay, better than it was before. No new symptoms.  Previous laboratory workup did show a low vitamin D  as well as a fairly high ANA titer.    Past Medical History:  Diagnosis Date   Anemia    Colon polyp    Depression    Diverticulosis    Gallbladder sludge    Hypertension    Hypertriglyceridemia    Past Surgical History:  Procedure Laterality Date   BREAST BIOPSY Right    BREAST EXCISIONAL BIOPSY Right 2012   CESAREAN SECTION     x 3   DILATION AND CURETTAGE OF UTERUS     TUBAL LIGATION     Social History   Socioeconomic History   Marital status: Married    Spouse name: Not on file   Number of children: 3   Years of education: Not on file   Highest education level: Master's degree (e.g., MA, MS, MEng, MEd, MSW, MBA)  Occupational History   Occupation: RN   Tobacco Use   Smoking status: Never   Smokeless tobacco: Never  Vaping Use   Vaping status: Never Used  Substance and Sexual Activity   Alcohol use: Yes    Comment: occasional   Drug use: No   Sexual activity: Not on file  Other Topics Concern   Not on file  Social History Narrative   Not on file   Social Drivers of Health   Financial Resource Strain: Low Risk  (08/17/2023)   Overall Financial Resource Strain (CARDIA)    Difficulty of Paying Living Expenses: Not hard at all  Food Insecurity: No Food Insecurity (08/17/2023)   Hunger Vital Sign    Worried About Running Out of Food in the Last Year: Never true    Ran Out of Food in the Last Year: Never true  Transportation Needs: No Transportation Needs (08/17/2023)   PRAPARE - Administrator, Civil Service (Medical): No    Lack of Transportation (Non-Medical): No  Physical Activity: Insufficiently Active (08/17/2023)   Exercise Vital Sign    Days of Exercise per Week: 1 day    Minutes of Exercise per Session: 30 min  Stress: No Stress Concern Present (08/17/2023)   [employer] of Occupational Health - Occupational Stress Questionnaire  Feeling of Stress : Only a little  Social Connections: Socially Integrated (08/17/2023)   Social Connection and Isolation Panel    Frequency of Communication with Friends and Family: More than three times a week    Frequency of Social Gatherings with Friends and Family: Twice a week    Attends Religious Services: More than 4 times per year    Active Member of [employer] or Organizations: Yes    Attends Engineer, structural: More than 4 times per year    Marital Status: Married   No Known Allergies Family History  Problem Relation Age of Onset   Hypertension Mother    Diabetes Mother    Lung cancer Father    Breast cancer Sister 53   Leukemia Sister    Lung cancer Sister    Colon cancer Maternal Grandmother    CAD Neg Hx      Current Outpatient Medications  (Cardiovascular):    atorvastatin  (LIPITOR) 20 MG tablet, Take 1 tablet (20 mg total) by mouth daily.   lisinopril -hydrochlorothiazide  (ZESTORETIC ) 20-25 MG tablet, Take 0.5 (one half) tablet by mouth daily.   Current Outpatient Medications (Analgesics):    meloxicam  (MOBIC ) 15 MG tablet, Take 1 tablet (15 mg total) by mouth daily.  Current Outpatient Medications (Hematological):    vitamin B-12 (CYANOCOBALAMIN ) 100 MCG tablet, Take 100 mcg by mouth daily.  Current Outpatient Medications (Other):    cholecalciferol (VITAMIN D3) 25 MCG (1000 UNIT) tablet, Take 1,000 Units by mouth daily.   pyridOXINE (VITAMIN B6) 25 MG tablet, Take 25 mg by mouth daily.   Reviewed prior external information including notes and imaging from  primary care provider As well as notes that were available from care everywhere and other healthcare systems.  Objective  Blood pressure 112/74, pulse 98, height 5' 4 (1.626 m), weight 202 lb (91.6 kg), SpO2 97%.   General: No apparent distress alert and oriented x3 mood and affect normal, dressed appropriately.  HEENT: Pupils equal, extraocular movements intact  Respiratory: Patient's speak in full sentences and does not appear short of breath  Cardiovascular: No lower extremity edema, non tender, no erythema  Knee exam shows right knee does have trace effusion noted.  Mild instability with valgus and varus force.  Lacks maybe the last 2 degrees.    Impression and Recommendations:    The above documentation has been reviewed and is accurate and complete Trixie Maclaren M Kenaz Olafson, DO

## 2023-09-16 ENCOUNTER — Ambulatory Visit: Admitting: Family Medicine

## 2023-09-16 ENCOUNTER — Encounter: Payer: Self-pay | Admitting: Family Medicine

## 2023-09-16 VITALS — BP 112/74 | HR 98 | Ht 64.0 in | Wt 202.0 lb

## 2023-09-16 DIAGNOSIS — M65961 Unspecified synovitis and tenosynovitis, right lower leg: Secondary | ICD-10-CM | POA: Diagnosis not present

## 2023-09-16 NOTE — Assessment & Plan Note (Signed)
 Significant improvement noted.  Still doing better overall.  Still having some icing regimen of home exercises.  Increase activity as tolerated.  Still can do icing.  If worsening pain can consider injections again.  Follow-up again in 6 to 8 weeks

## 2023-09-16 NOTE — Patient Instructions (Signed)
 NO changes, because you're doing well See you again in 3-4 months

## 2023-11-23 ENCOUNTER — Encounter: Payer: Self-pay | Admitting: Family Medicine

## 2023-11-23 ENCOUNTER — Ambulatory Visit (INDEPENDENT_AMBULATORY_CARE_PROVIDER_SITE_OTHER): Admitting: Family Medicine

## 2023-11-23 VITALS — BP 130/77 | HR 74 | Ht 64.0 in | Wt 201.0 lb

## 2023-11-23 DIAGNOSIS — E782 Mixed hyperlipidemia: Secondary | ICD-10-CM

## 2023-11-23 DIAGNOSIS — R739 Hyperglycemia, unspecified: Secondary | ICD-10-CM | POA: Diagnosis not present

## 2023-11-23 DIAGNOSIS — R7989 Other specified abnormal findings of blood chemistry: Secondary | ICD-10-CM

## 2023-11-23 DIAGNOSIS — I10 Essential (primary) hypertension: Secondary | ICD-10-CM

## 2023-11-23 DIAGNOSIS — E559 Vitamin D deficiency, unspecified: Secondary | ICD-10-CM | POA: Diagnosis not present

## 2023-11-23 DIAGNOSIS — G4733 Obstructive sleep apnea (adult) (pediatric): Secondary | ICD-10-CM

## 2023-11-23 DIAGNOSIS — Z0000 Encounter for general adult medical examination without abnormal findings: Secondary | ICD-10-CM | POA: Diagnosis not present

## 2023-11-23 DIAGNOSIS — Z1231 Encounter for screening mammogram for malignant neoplasm of breast: Secondary | ICD-10-CM

## 2023-11-23 NOTE — Patient Instructions (Signed)
 Preventive Care 64-64 Years Old, Female  Preventive care refers to lifestyle choices and visits with your health care provider that can promote health and wellness. Preventive care visits are also called wellness exams.  What can I expect for my preventive care visit?  Counseling  Your health care provider may ask you questions about your:  Medical history, including:  Past medical problems.  Family medical history.  Pregnancy history.  Current health, including:  Menstrual cycle.  Method of birth control.  Emotional well-being.  Home life and relationship well-being.  Sexual activity and sexual health.  Lifestyle, including:  Alcohol, nicotine or tobacco, and drug use.  Access to firearms.  Diet, exercise, and sleep habits.  Work and work Astronomer.  Sunscreen use.  Safety issues such as seatbelt and bike helmet use.  Physical exam  Your health care provider will check your:  Height and weight. These may be used to calculate your BMI (body mass index). BMI is a measurement that tells if you are at a healthy weight.  Waist circumference. This measures the distance around your waistline. This measurement also tells if you are at a healthy weight and may help predict your risk of certain diseases, such as type 2 diabetes and high blood pressure.  Heart rate and blood pressure.  Body temperature.  Skin for abnormal spots.  What immunizations do I need?    Vaccines are usually given at various ages, according to a schedule. Your health care provider will recommend vaccines for you based on your age, medical history, and lifestyle or other factors, such as travel or where you work.  What tests do I need?  Screening  Your health care provider may recommend screening tests for certain conditions. This may include:  Lipid and cholesterol levels.  Diabetes screening. This is done by checking your blood sugar (glucose) after you have not eaten for a while (fasting).  Pelvic exam and Pap test.  Hepatitis B test.  Hepatitis C  test.  HIV (human immunodeficiency virus) test.  STI (sexually transmitted infection) testing, if you are at risk.  Lung cancer screening.  Colorectal cancer screening.  Mammogram. Talk with your health care provider about when you should start having regular mammograms. This may depend on whether you have a family history of breast cancer.  BRCA-related cancer screening. This may be done if you have a family history of breast, ovarian, tubal, or peritoneal cancers.  Bone density scan. This is done to screen for osteoporosis.  Talk with your health care provider about your test results, treatment options, and if necessary, the need for more tests.  Follow these instructions at home:  Eating and drinking    Eat a diet that includes fresh fruits and vegetables, whole grains, lean protein, and low-fat dairy products.  Take vitamin and mineral supplements as recommended by your health care provider.  Do not drink alcohol if:  Your health care provider tells you not to drink.  You are pregnant, may be pregnant, or are planning to become pregnant.  If you drink alcohol:  Limit how much you have to 0-1 drink a day.  Know how much alcohol is in your drink. In the [place] one drink equals one 12 oz bottle of beer (355 mL), one 5 oz glass of wine (148 mL), or one 1 oz glass of hard liquor (44 mL).  Lifestyle  Brush your teeth every morning and night with fluoride toothpaste. Floss one time each day.  Exercise for at least  30 minutes 5 or more days each week.  Do not use any products that contain nicotine or tobacco. These products include cigarettes, chewing tobacco, and vaping devices, such as e-cigarettes. If you need help quitting, ask your health care provider.  Do not use drugs.  If you are sexually active, practice safe sex. Use a condom or other form of protection to prevent STIs.  If you do not wish to become pregnant, use a form of birth control. If you plan to become pregnant, see your health care provider for a  prepregnancy visit.  Take aspirin only as told by your health care provider. Make sure that you understand how much to take and what form to take. Work with your health care provider to find out whether it is safe and beneficial for you to take aspirin daily.  Find healthy ways to manage stress, such as:  Meditation, yoga, or listening to music.  Journaling.  Talking to a trusted person.  Spending time with friends and family.  Minimize exposure to UV radiation to reduce your risk of skin cancer.  Safety  Always wear your seat belt while driving or riding in a vehicle.  Do not drive:  If you have been drinking alcohol. Do not ride with someone who has been drinking.  When you are tired or distracted.  While texting.  If you have been using any mind-altering substances or drugs.  Wear a helmet and other protective equipment during sports activities.  If you have firearms in your house, make sure you follow all gun safety procedures.  Seek help if you have been physically or sexually abused.  What's next?  Visit your health care provider once a year for an annual wellness visit.  Ask your health care provider how often you should have your eyes and teeth checked.  Stay up to date on all vaccines.  This information is not intended to replace advice given to you by your health care provider. Make sure you discuss any questions you have with your health care provider.  Document Revised: 09/11/2020 Document Reviewed: 09/11/2020  Elsevier Patient Education  2024 [employer].

## 2023-11-23 NOTE — Assessment & Plan Note (Addendum)
 Well adult Orders Placed This Encounter  Procedures   MM 3D SCREENING MAMMOGRAM BILATERAL BREAST    Standing Status:   Future    Expiration Date:   11/22/2024    Reason for Exam (SYMPTOM  OR DIAGNOSIS REQUIRED):   breast cancer screening    Preferred imaging location?:   [facility]   [id number]+EGFR   CBC with Differential/Platelet   Lipid Panel With LDL/HDL Ratio   HgB A1c   A87   Vitamin D  (25 hydroxy)  Screenings: per lab orders Immunizations:  UTD Anticipatory guidance/Risk factor reduction:  Recommendations per AVS.

## 2023-11-23 NOTE — Progress Notes (Signed)
 Nicole Osborne - 63 y.o. female MRN [id number]  Date of birth: [birth year]  Subjective Chief Complaint  Patient presents with   Annual Exam    HPI Nicole Osborne is a 63 y.o. female here today for annual exam.   She reports that she is doing pretty well.  Has  some fatigue.  She is moderately active.  She feels that diet is pretty good most of the time.   She is a non-smoker.  Rare EtOH  Review of Systems  Constitutional:  Negative for chills, fever, malaise/fatigue and weight loss.  HENT:  Negative for congestion, ear pain and sore throat.   Eyes:  Negative for blurred vision, double vision and pain.  Respiratory:  Negative for cough and shortness of breath.   Cardiovascular:  Negative for chest pain and palpitations.  Gastrointestinal:  Negative for abdominal pain, blood in stool, constipation, heartburn and nausea.  Genitourinary:  Negative for dysuria and urgency.  Musculoskeletal:  Negative for joint pain and myalgias.  Neurological:  Negative for dizziness and headaches.  Endo/Heme/Allergies:  Does not bruise/bleed easily.  Psychiatric/Behavioral:  Negative for depression. The patient is not nervous/anxious and does not have insomnia.     No Known Allergies  Past Medical History:  Diagnosis Date   Anemia    Colon polyp    Depression    Diverticulosis    Gallbladder sludge    Hypertension    Hypertriglyceridemia     Past Surgical History:  Procedure Laterality Date   BREAST BIOPSY Right    BREAST EXCISIONAL BIOPSY Right 2012   CESAREAN SECTION     x 3   DILATION AND CURETTAGE OF UTERUS     TUBAL LIGATION      Social History   Socioeconomic History   Marital status: Married    Spouse name: Not on file   Number of children: 3   Years of education: Not on file   Highest education level: Master's degree (e.g., MA, MS, MEng, MEd, MSW, MBA)  Occupational History   Occupation: RN  Tobacco Use   Smoking status: Never   Smokeless tobacco: Never   Vaping Use   Vaping status: Never Used  Substance and Sexual Activity   Alcohol use: Yes    Comment: occasional   Drug use: No   Sexual activity: Not on file  Other Topics Concern   Not on file  Social History Narrative   Not on file   Social Drivers of Health   Financial Resource Strain: Low Risk  (08/17/2023)   Overall Financial Resource Strain (CARDIA)    Difficulty of Paying Living Expenses: Not hard at all  Food Insecurity: No Food Insecurity (08/17/2023)   Hunger Vital Sign    Worried About Running Out of Food in the Last Year: Never true    Ran Out of Food in the Last Year: Never true  Transportation Needs: No Transportation Needs (08/17/2023)   PRAPARE - Administrator, Civil Service (Medical): No    Lack of Transportation (Non-Medical): No  Physical Activity: Insufficiently Active (08/17/2023)   Exercise Vital Sign    Days of Exercise per Week: 1 day    Minutes of Exercise per Session: 30 min  Stress: No Stress Concern Present (08/17/2023)   [employer] of Occupational Health - Occupational Stress Questionnaire    Feeling of Stress : Only a little  Social Connections: Socially Integrated (08/17/2023)   Social Connection and Isolation Panel    Frequency of  Communication with Friends and Family: More than three times a week    Frequency of Social Gatherings with Friends and Family: Twice a week    Attends Religious Services: More than 4 times per year    Active Member of Clubs or Organizations: Yes    Attends Engineer, structural: More than 4 times per year    Marital Status: Married    Family History  Problem Relation Age of Onset   Hypertension Mother    Diabetes Mother    Lung cancer Father    Breast cancer Sister 68   Leukemia Sister    Lung cancer Sister    Colon cancer Maternal Grandmother    CAD Neg Hx     Health Maintenance  Topic Date Due   COVID-19 Vaccine (3 - 2024-25 season) 11/29/2022   MAMMOGRAM  11/05/2023    INFLUENZA VACCINE  10/29/2023   Pneumococcal Vaccine: 50+ Years (1 of 1 - PCV) 11/22/2024 (Originally 12/17/2009)   Colonoscopy  02/25/2026   Cervical Cancer Screening (HPV/Pap Cotest)  02/18/2027   DTaP/Tdap/Td (2 - Td or Tdap) 08/15/2031   Hepatitis C Screening  Completed   HIV Screening  Completed   Zoster Vaccines- Shingrix   Completed   Hepatitis B Vaccines 19-59 Average Risk  Aged Out   HPV VACCINES  Aged Out   Meningococcal B Vaccine  Aged Out     ----------------------------------------------------------------------------------------------------------------------------------------------------------------------------------------------------------------- Physical Exam BP 130/77 (BP Location: Left Arm, Patient Position: Sitting, Cuff Size: Large)   Pulse 74   Ht 5' 4 (1.626 m)   Wt 201 lb (91.2 kg)   SpO2 97%   BMI 34.50 kg/m   Physical Exam Constitutional:      General: She is not in acute distress. HENT:     Head: Normocephalic and atraumatic.     Right Ear: Tympanic membrane and ear canal normal.     Left Ear: Tympanic membrane and ear canal normal.     Nose: Nose normal.     Mouth/Throat:     Tonsils: 2+ on the right. 2+ on the left.  Eyes:     General: No scleral icterus.    Conjunctiva/sclera: Conjunctivae normal.  Neck:     Thyroid : No thyromegaly.  Cardiovascular:     Rate and Rhythm: Normal rate and regular rhythm.     Heart sounds: Normal heart sounds.  Pulmonary:     Effort: Pulmonary effort is normal.     Breath sounds: Normal breath sounds.  Abdominal:     General: Bowel sounds are normal. There is no distension.     Palpations: Abdomen is soft.     Tenderness: There is no abdominal tenderness. There is no guarding.  Musculoskeletal:        General: Normal range of motion.     Cervical back: Normal range of motion and neck supple.  Lymphadenopathy:     Cervical: No cervical adenopathy.  Skin:    General: Skin is warm and dry.     Findings: No  rash.  Neurological:     General: No focal deficit present.     Mental Status: She is alert and oriented to person, place, and time.     Cranial Nerves: No cranial nerve deficit.     Coordination: Coordination normal.  Psychiatric:        Mood and Affect: Mood normal.        Behavior: Behavior normal.     ------------------------------------------------------------------------------------------------------------------------------------------------------------------------------------------------------------------- Assessment and Plan  Well adult exam Well  adult Orders Placed This Encounter  Procedures   MM 3D SCREENING MAMMOGRAM BILATERAL BREAST    Standing Status:   Future    Expiration Date:   11/22/2024    Reason for Exam (SYMPTOM  OR DIAGNOSIS REQUIRED):   breast cancer screening    Preferred imaging location?:   [facility]   CMP14+EGFR   CBC with Differential/Platelet   Lipid Panel With LDL/HDL Ratio   HgB A1c   A87   Vitamin D  (25 hydroxy)  Screenings: per lab orders Immunizations:  UTD Anticipatory guidance/Risk factor reduction:  Recommendations per AVS.  Severe obstructive sleep apnea She is not using CPAP.  Did not tolerate due to feeling of claustrophobia.  She has a lot of posterior oropharyngeal crowding.  Discussed alternatives including zepbound and inspire device.     No orders of the defined types were placed in this encounter.   No follow-ups on file.

## 2023-11-23 NOTE — Assessment & Plan Note (Signed)
 She is not using CPAP.  Did not tolerate due to feeling of claustrophobia.  She has a lot of posterior oropharyngeal crowding.  Discussed alternatives including zepbound and inspire device.

## 2023-11-24 LAB — CMP14+EGFR
ALT: 78 IU/L — ABNORMAL HIGH (ref 0–32)
AST: 83 IU/L — ABNORMAL HIGH (ref 0–40)
Albumin: 4.3 g/dL (ref 3.9–4.9)
Alkaline Phosphatase: 104 IU/L (ref 44–121)
BUN/Creatinine Ratio: 13 (ref 12–28)
BUN: 9 mg/dL (ref 8–27)
Bilirubin Total: 1.2 mg/dL (ref 0.0–1.2)
CO2: 21 mmol/L (ref 20–29)
Calcium: 9.7 mg/dL (ref 8.7–10.3)
Chloride: 98 mmol/L (ref 96–106)
Creatinine, Ser: 0.69 mg/dL (ref 0.57–1.00)
Globulin, Total: 2.3 g/dL (ref 1.5–4.5)
Glucose: 116 mg/dL — ABNORMAL HIGH (ref 70–99)
Potassium: 3.9 mmol/L (ref 3.5–5.2)
Sodium: 135 mmol/L (ref 134–144)
Total Protein: 6.6 g/dL (ref 6.0–8.5)
eGFR: 97 mL/min/1.73 (ref >59)

## 2023-11-24 LAB — CBC WITH DIFFERENTIAL/PLATELET
Basophils Absolute: 0 x10E3/uL (ref 0.0–0.2)
Basos: 1 %
EOS (ABSOLUTE): 0.3 x10E3/uL (ref 0.0–0.4)
Eos: 4 %
Hematocrit: 45.4 % (ref 34.0–46.6)
Hemoglobin: 14.6 g/dL (ref 11.1–15.9)
Immature Grans (Abs): 0 x10E3/uL (ref 0.0–0.1)
Immature Granulocytes: 0 %
Lymphocytes Absolute: 2.3 x10E3/uL (ref 0.7–3.1)
Lymphs: 32 %
MCH: 28.2 pg (ref 26.6–33.0)
MCHC: 32.2 g/dL (ref 31.5–35.7)
MCV: 88 fL (ref 79–97)
Monocytes Absolute: 0.6 x10E3/uL (ref 0.1–0.9)
Monocytes: 8 %
Neutrophils Absolute: 4 x10E3/uL (ref 1.4–7.0)
Neutrophils: 55 %
Platelets: 247 x10E3/uL (ref 150–450)
RBC: 5.17 x10E6/uL (ref 3.77–5.28)
RDW: 12.6 % (ref 11.7–15.4)
WBC: 7.2 x10E3/uL (ref 3.4–10.8)

## 2023-11-24 LAB — LIPID PANEL WITH LDL/HDL RATIO
Cholesterol, Total: 211 mg/dL — ABNORMAL HIGH (ref 100–199)
HDL: 49 mg/dL (ref >39)
LDL Chol Calc (NIH): 132 mg/dL — ABNORMAL HIGH (ref 0–99)
LDL/HDL Ratio: 2.7 ratio (ref 0.0–3.2)
Triglycerides: 168 mg/dL — ABNORMAL HIGH (ref 0–149)
VLDL Cholesterol Cal: 30 mg/dL (ref 5–40)

## 2023-11-24 LAB — VITAMIN D 25 HYDROXY (VIT D DEFICIENCY, FRACTURES): Vit D, 25-Hydroxy: 42.2 ng/mL (ref 30.0–100.0)

## 2023-11-24 LAB — VITAMIN B12: Vitamin B-12: 615 pg/mL (ref 232–1245)

## 2023-11-24 LAB — HEMOGLOBIN A1C
Est. average glucose Bld gHb Est-mCnc: 143 mg/dL
Hgb A1c MFr Bld: 6.6 % — ABNORMAL HIGH (ref 4.8–5.6)

## 2023-11-25 ENCOUNTER — Ambulatory Visit (INDEPENDENT_AMBULATORY_CARE_PROVIDER_SITE_OTHER)

## 2023-11-25 DIAGNOSIS — Z1231 Encounter for screening mammogram for malignant neoplasm of breast: Secondary | ICD-10-CM

## 2023-12-05 ENCOUNTER — Ambulatory Visit: Payer: Self-pay | Admitting: Family Medicine

## 2023-12-05 DIAGNOSIS — E119 Type 2 diabetes mellitus without complications: Secondary | ICD-10-CM

## 2023-12-16 ENCOUNTER — Other Ambulatory Visit (HOSPITAL_COMMUNITY): Payer: Self-pay

## 2023-12-16 DIAGNOSIS — E119 Type 2 diabetes mellitus without complications: Secondary | ICD-10-CM | POA: Insufficient documentation

## 2023-12-16 MED ORDER — SEMAGLUTIDE (1 MG/DOSE) 4 MG/3ML ~~LOC~~ SOPN
1.0000 mg | PEN_INJECTOR | SUBCUTANEOUS | 0 refills | Status: AC
  Filled 2023-12-16: qty 9, 84d supply, fill #0

## 2023-12-16 MED ORDER — OZEMPIC (0.25 OR 0.5 MG/DOSE) 2 MG/3ML ~~LOC~~ SOPN
PEN_INJECTOR | SUBCUTANEOUS | 1 refills | Status: AC
  Filled 2023-12-16: qty 3, 42d supply, fill #0
  Filled 2023-12-16: qty 3, 30d supply, fill #0
  Filled 2023-12-21: qty 3, 28d supply, fill #0

## 2023-12-17 ENCOUNTER — Other Ambulatory Visit (HOSPITAL_COMMUNITY): Payer: Self-pay

## 2023-12-17 ENCOUNTER — Telehealth: Payer: Self-pay

## 2023-12-17 NOTE — Telephone Encounter (Signed)
 Pharmacy Patient Advocate Encounter  Received notification from [facility] that Prior Authorization for Ozempic  2mg /20ml has been APPROVED from 12/17/23 to 12/15/24   PA #/Case ID/Reference #: [id number]

## 2023-12-17 NOTE — Telephone Encounter (Signed)
 Pharmacy Patient Advocate Encounter   Received notification from Patient Pharmacy that prior authorization for Ozempic  2mg /85ml is required/requested.   Insurance verification completed.   The patient is insured through [facility] .   Per test claim: PA required; PA submitted to above mentioned insurance via Latent Key/confirmation #/EOC [id number] Status is pending

## 2023-12-21 ENCOUNTER — Other Ambulatory Visit (HOSPITAL_COMMUNITY): Payer: Self-pay

## 2024-01-14 NOTE — Progress Notes (Unsigned)
 [facility] [address] [place] [address] Phone: [phone] Subjective:   Nicole Osborne, am serving as a scribe for Dr. Arthea Claudene.  I'm seeing this patient by the request  of:  Alvia Bring, DO  CC: Multiple joint complaints  YEP:Dlagzrupcz  09/16/2023 Significant improvement noted.  Still doing better overall.  Still having some icing regimen of home exercises.  Increase activity as tolerated.  Still can do icing.  If worsening pain can consider injections again.  Follow-up again in 6 to 8 weeks      Update 01/17/2024 Nicole Osborne is a 63 y.o. female coming in with complaint of R knee pain. Patient states that her knee is sore.   B foot pain. Inserts from [employer]. When she does not wear insoles her pain increases.   Also c/o lower back and B hip pain. Pain in the mornings especially. Notices changes in barometric pressure within her body.        Past Medical History:  Diagnosis Date   Anemia    Colon polyp    Depression    Diverticulosis    Gallbladder sludge    Hypertension    Hypertriglyceridemia    Past Surgical History:  Procedure Laterality Date   BREAST BIOPSY Right    BREAST EXCISIONAL BIOPSY Right 2012   CESAREAN SECTION     x 3   DILATION AND CURETTAGE OF UTERUS     TUBAL LIGATION     Social History   Socioeconomic History   Marital status: Married    Spouse name: Not on file   Number of children: 3   Years of education: Not on file   Highest education level: Master's degree (e.g., MA, MS, MEng, MEd, MSW, MBA)  Occupational History   Occupation: RN  Tobacco Use   Smoking status: Never   Smokeless tobacco: Never  Vaping Use   Vaping status: Never Used  Substance and Sexual Activity   Alcohol use: Yes    Comment: occasional   Drug use: No   Sexual activity: Not on file  Other Topics Concern   Not on file  Social History Narrative   Not on file   Social Drivers of Health    Financial Resource Strain: Low Risk  (08/17/2023)   Overall Financial Resource Strain (CARDIA)    Difficulty of Paying Living Expenses: Not hard at all  Food Insecurity: No Food Insecurity (08/17/2023)   Hunger Vital Sign    Worried About Running Out of Food in the Last Year: Never true    Ran Out of Food in the Last Year: Never true  Transportation Needs: No Transportation Needs (08/17/2023)   PRAPARE - Administrator, Civil Service (Medical): No    Lack of Transportation (Non-Medical): No  Physical Activity: Insufficiently Active (08/17/2023)   Exercise Vital Sign    Days of Exercise per Week: 1 day    Minutes of Exercise per Session: 30 min  Stress: No Stress Concern Present (08/17/2023)   [employer] of Occupational Health - Occupational Stress Questionnaire    Feeling of Stress : Only a little  Social Connections: Socially Integrated (08/17/2023)   Social Connection and Isolation Panel    Frequency of Communication with Friends and Family: More than three times a week    Frequency of Social Gatherings with Friends and Family: Twice a week    Attends Religious Services: More than 4 times per year  Active Member of Clubs or Organizations: Yes    Attends Engineer, structural: More than 4 times per year    Marital Status: Married   No Known Allergies Family History  Problem Relation Age of Onset   Hypertension Mother    Diabetes Mother    Lung cancer Father    Breast cancer Sister 13   Leukemia Sister    Lung cancer Sister    Colon cancer Maternal Grandmother    CAD Neg Hx     Current Outpatient Medications (Endocrine & Metabolic):    Semaglutide , 1 MG/DOSE, 4 MG/3ML SOPN, Inject 1 mg into the skin once a week.   Semaglutide ,0.25 or 0.5MG /DOS, (OZEMPIC , 0.25 OR 0.5 MG/DOSE,) 2 MG/3ML SOPN, Inject 0.25 mg into the skin once a week for 28 days, THEN 0.5 mg once a week for 28 days, THEN 1 mg once a week.  Current Outpatient Medications  (Cardiovascular):    atorvastatin  (LIPITOR) 20 MG tablet, Take 1 tablet (20 mg total) by mouth daily.   lisinopril -hydrochlorothiazide  (ZESTORETIC ) 20-25 MG tablet, Take 0.5 (one half) tablet by mouth daily.   Current Outpatient Medications (Analgesics):    meloxicam  (MOBIC ) 15 MG tablet, Take 1 tablet (15 mg total) by mouth daily.  Current Outpatient Medications (Hematological):    vitamin B-12 (CYANOCOBALAMIN ) 100 MCG tablet, Take 100 mcg by mouth daily.  Current Outpatient Medications (Other):    cholecalciferol (VITAMIN D3) 25 MCG (1000 UNIT) tablet, Take 1,000 Units by mouth daily.   pyridOXINE (VITAMIN B6) 25 MG tablet, Take 25 mg by mouth daily.   Reviewed prior external information including notes and imaging from  primary care provider As well as notes that were available from care everywhere and other healthcare systems.  Past medical history, social, surgical and family history all reviewed in electronic medical record.  No pertanent information unless stated regarding to the chief complaint.   Review of Systems:  No headache, visual changes, nausea, vomiting, diarrhea, constipation, dizziness, abdominal pain, skin rash, fevers, chills, night sweats, weight loss, swollen lymph nodes, body aches, joint swelling, chest pain, shortness of breath, mood changes. POSITIVE muscle aches  Objective  Blood pressure 114/78, pulse 70, height 5' 4 (1.626 m), weight 195 lb (88.5 kg), SpO2 98%.   General: No apparent distress alert and oriented x3 mood and affect normal, dressed appropriately.  HEENT: Pupils equal, extraocular movements intact  Respiratory: Patient's speak in full sentences and does not appear short of breath  Cardiovascular: No lower extremity edema, non tender, no erythema  Foot exam shows breakdown of the transverse arch noted but seems to be splaying between the 3rd and 4th toes bilaterally.  Patient does have mild rigidity noted of the midfoot.  Low back does have  some loss lordosis.  Some mild tenderness to the feet.  Negative Right knee still has an effusion noted of the knee and potentially worsening from previous exam.  Some limited range of motion noted.  After informed written and verbal consent, patient was seated on exam table. Right knee was prepped with alcohol swab and utilizing anterolateral approach, patient's right knee space was injected with 4:1  marcaine 0.5%: Kenalog 40mg /dL. Patient tolerated the procedure well without immediate complications.     Impression and Recommendations:    The above documentation has been reviewed and is accurate and complete Nicole Osborne M Chick Cousins, DO

## 2024-01-17 ENCOUNTER — Ambulatory Visit (INDEPENDENT_AMBULATORY_CARE_PROVIDER_SITE_OTHER)

## 2024-01-17 ENCOUNTER — Encounter: Payer: Self-pay | Admitting: Family Medicine

## 2024-01-17 ENCOUNTER — Ambulatory Visit: Admitting: Family Medicine

## 2024-01-17 VITALS — BP 114/78 | HR 70 | Ht 64.0 in | Wt 195.0 lb

## 2024-01-17 DIAGNOSIS — M545 Low back pain, unspecified: Secondary | ICD-10-CM | POA: Diagnosis not present

## 2024-01-17 DIAGNOSIS — M47816 Spondylosis without myelopathy or radiculopathy, lumbar region: Secondary | ICD-10-CM | POA: Diagnosis not present

## 2024-01-17 DIAGNOSIS — G8929 Other chronic pain: Secondary | ICD-10-CM | POA: Diagnosis not present

## 2024-01-17 DIAGNOSIS — M216X9 Other acquired deformities of unspecified foot: Secondary | ICD-10-CM | POA: Insufficient documentation

## 2024-01-17 DIAGNOSIS — M216X1 Other acquired deformities of right foot: Secondary | ICD-10-CM | POA: Diagnosis not present

## 2024-01-17 DIAGNOSIS — M65961 Unspecified synovitis and tenosynovitis, right lower leg: Secondary | ICD-10-CM

## 2024-01-17 DIAGNOSIS — M216X2 Other acquired deformities of left foot: Secondary | ICD-10-CM | POA: Diagnosis not present

## 2024-01-17 DIAGNOSIS — M5134 Other intervertebral disc degeneration, thoracic region: Secondary | ICD-10-CM | POA: Diagnosis not present

## 2024-01-17 DIAGNOSIS — M5136 Other intervertebral disc degeneration, lumbar region with discogenic back pain only: Secondary | ICD-10-CM | POA: Diagnosis not present

## 2024-01-17 NOTE — Assessment & Plan Note (Signed)
 Bilateral with breakdown also of the transverse arch.  Given home exercises, discussed over-the-counter orthotics, discussed shoe choices and recovery sandals follow-up again in 2 to 3 months

## 2024-01-17 NOTE — Assessment & Plan Note (Signed)
 Repeat injection given today, chronic, with worsening symptoms.  Discussed symptom monitoring with, blood glucose, icing regimen and signs of infectious etiology.  Patient going to get new insoles that I think would be beneficial as well.  Follow-up in 2 to 3 months

## 2024-01-17 NOTE — Assessment & Plan Note (Signed)
 Chronic problem but will get an x-ray.  Discussed with patient if any radicular symptoms would like to see her again.  Hopefully the shoe insoles will make a difference

## 2024-01-17 NOTE — Patient Instructions (Addendum)
 Injected knee Xray Spenco total Support Original New Balance 990  [place]oka Recovery in the house Exercises Seated elliptical See me in 3-6 wmonths

## 2024-01-19 ENCOUNTER — Ambulatory Visit: Payer: Self-pay | Admitting: Family Medicine

## 2024-02-29 DIAGNOSIS — H524 Presbyopia: Secondary | ICD-10-CM | POA: Diagnosis not present

## 2024-02-29 DIAGNOSIS — H52223 Regular astigmatism, bilateral: Secondary | ICD-10-CM | POA: Diagnosis not present

## 2024-03-31 ENCOUNTER — Other Ambulatory Visit (HOSPITAL_COMMUNITY): Payer: Self-pay

## 2024-04-03 ENCOUNTER — Other Ambulatory Visit (HOSPITAL_COMMUNITY): Payer: Self-pay

## 2024-05-19 ENCOUNTER — Ambulatory Visit: Admitting: Family Medicine
